# Patient Record
Sex: Female | Born: 1980 | Race: White | Hispanic: No | Marital: Single | Smoking: Current every day smoker
Health system: Southern US, Community
[De-identification: ages and names within clinical notes are randomized; demographics above are authoritative.]

## PROBLEM LIST (undated history)

## (undated) ENCOUNTER — Inpatient Hospital Stay (HOSPITAL_COMMUNITY): Payer: Self-pay

## (undated) DIAGNOSIS — K760 Fatty (change of) liver, not elsewhere classified: Secondary | ICD-10-CM

## (undated) DIAGNOSIS — F410 Panic disorder [episodic paroxysmal anxiety] without agoraphobia: Secondary | ICD-10-CM

## (undated) DIAGNOSIS — G8929 Other chronic pain: Secondary | ICD-10-CM

## (undated) DIAGNOSIS — D500 Iron deficiency anemia secondary to blood loss (chronic): Secondary | ICD-10-CM

## (undated) DIAGNOSIS — G40909 Epilepsy, unspecified, not intractable, without status epilepticus: Secondary | ICD-10-CM

## (undated) DIAGNOSIS — O009 Unspecified ectopic pregnancy without intrauterine pregnancy: Secondary | ICD-10-CM

## (undated) DIAGNOSIS — F319 Bipolar disorder, unspecified: Secondary | ICD-10-CM

## (undated) DIAGNOSIS — F1020 Alcohol dependence, uncomplicated: Secondary | ICD-10-CM

## (undated) DIAGNOSIS — G2581 Restless legs syndrome: Secondary | ICD-10-CM

## (undated) DIAGNOSIS — F4310 Post-traumatic stress disorder, unspecified: Secondary | ICD-10-CM

## (undated) DIAGNOSIS — R1115 Cyclical vomiting syndrome unrelated to migraine: Secondary | ICD-10-CM

## (undated) DIAGNOSIS — K219 Gastro-esophageal reflux disease without esophagitis: Secondary | ICD-10-CM

## (undated) DIAGNOSIS — Z973 Presence of spectacles and contact lenses: Secondary | ICD-10-CM

## (undated) DIAGNOSIS — F4481 Dissociative identity disorder: Secondary | ICD-10-CM

## (undated) DIAGNOSIS — N83209 Unspecified ovarian cyst, unspecified side: Secondary | ICD-10-CM

## (undated) DIAGNOSIS — M549 Dorsalgia, unspecified: Secondary | ICD-10-CM

## (undated) HISTORY — DX: Epilepsy, unspecified, not intractable, without status epilepticus: G40.909

## (undated) HISTORY — PX: ESOPHAGOGASTRODUODENOSCOPY: SHX1529

## (undated) HISTORY — PX: SALPINGOOPHORECTOMY: SHX82

## (undated) HISTORY — PX: COLONOSCOPY: SHX174

---
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
## 2003-03-19 ENCOUNTER — Encounter: Payer: Self-pay | Admitting: Internal Medicine

## 2003-03-19 ENCOUNTER — Encounter: Admission: RE | Admit: 2003-03-19 | Discharge: 2003-03-19 | Payer: Self-pay | Admitting: Internal Medicine

## 2003-03-27 ENCOUNTER — Encounter: Admission: RE | Admit: 2003-03-27 | Discharge: 2003-03-27 | Payer: Self-pay | Admitting: Internal Medicine

## 2003-03-27 ENCOUNTER — Encounter: Payer: Self-pay | Admitting: Internal Medicine

## 2003-12-29 ENCOUNTER — Emergency Department (HOSPITAL_COMMUNITY): Admission: EM | Admit: 2003-12-29 | Discharge: 2003-12-29 | Payer: Self-pay

## 2007-01-05 ENCOUNTER — Emergency Department (HOSPITAL_COMMUNITY): Admission: EM | Admit: 2007-01-05 | Discharge: 2007-01-06 | Payer: Self-pay | Admitting: Emergency Medicine

## 2010-10-18 ENCOUNTER — Emergency Department (HOSPITAL_BASED_OUTPATIENT_CLINIC_OR_DEPARTMENT_OTHER)
Admission: EM | Admit: 2010-10-18 | Discharge: 2010-10-18 | Disposition: A | Discharge status: Home / Self Care | Payer: Self-pay | Attending: Emergency Medicine | Admitting: Emergency Medicine

## 2010-10-18 ENCOUNTER — Emergency Department (INDEPENDENT_AMBULATORY_CARE_PROVIDER_SITE_OTHER): Payer: Self-pay

## 2010-10-18 DIAGNOSIS — M25579 Pain in unspecified ankle and joints of unspecified foot: Secondary | ICD-10-CM

## 2010-10-18 DIAGNOSIS — S92253A Displaced fracture of navicular [scaphoid] of unspecified foot, initial encounter for closed fracture: Secondary | ICD-10-CM | POA: Insufficient documentation

## 2010-10-18 DIAGNOSIS — M79609 Pain in unspecified limb: Secondary | ICD-10-CM

## 2010-10-18 DIAGNOSIS — W1789XA Other fall from one level to another, initial encounter: Secondary | ICD-10-CM | POA: Insufficient documentation

## 2010-10-18 DIAGNOSIS — M7730 Calcaneal spur, unspecified foot: Secondary | ICD-10-CM | POA: Insufficient documentation

## 2010-10-18 DIAGNOSIS — M7989 Other specified soft tissue disorders: Secondary | ICD-10-CM

## 2010-10-18 DIAGNOSIS — Y92009 Unspecified place in unspecified non-institutional (private) residence as the place of occurrence of the external cause: Secondary | ICD-10-CM | POA: Insufficient documentation

## 2011-03-30 LAB — CBC
HCT: 36.9
Hemoglobin: 12
MCHC: 32.6
MCV: 79
Platelets: 272
RBC: 4.67
RDW: 16 — ABNORMAL HIGH
WBC: 9.1

## 2011-03-30 LAB — COMPREHENSIVE METABOLIC PANEL
ALT: <8
AST: 18
Albumin: 3.7
Alkaline Phosphatase: 74
BUN: 10
CO2: 23
Calcium: 8.6
Chloride: 102
Creatinine, Ser: 0.81
GFR calc Af Amer: >60
GFR calc non Af Amer: >60
Glucose, Bld: 90
Potassium: 3.4 — ABNORMAL LOW
Sodium: 133 — ABNORMAL LOW
Total Bilirubin: 0.6
Total Protein: 6.7

## 2011-03-30 LAB — WET PREP, GENITAL
Trich, Wet Prep: NONE SEEN
Yeast Wet Prep HPF POC: NONE SEEN

## 2011-03-30 LAB — URINALYSIS, ROUTINE W REFLEX MICROSCOPIC
Bilirubin Urine: NEGATIVE
Glucose, UA: NEGATIVE
Hgb urine dipstick: NEGATIVE
Ketones, ur: NEGATIVE
Nitrite: NEGATIVE
Protein, ur: NEGATIVE
Specific Gravity, Urine: 1.017
Urobilinogen, UA: 0.2
pH: 6.5

## 2011-03-30 LAB — DIFFERENTIAL
Basophils Absolute: 0.1
Basophils Relative: 1
Eosinophils Absolute: 0.2
Eosinophils Relative: 2
Lymphocytes Relative: 29
Lymphs Abs: 2.6
Monocytes Absolute: 0.5
Monocytes Relative: 6
Neutro Abs: 5.7
Neutrophils Relative %: 63

## 2011-03-30 LAB — POCT PREGNANCY, URINE
Operator id: 231701
Preg Test, Ur: NEGATIVE

## 2011-03-30 LAB — GC/CHLAMYDIA PROBE AMP, GENITAL
Chlamydia, DNA Probe: NEGATIVE
GC Probe Amp, Genital: NEGATIVE

## 2011-03-30 LAB — POCT URINE HEMOGLOBIN
Hgb urine dipstick: POSITIVE — AB
Operator id: 231701

## 2011-03-30 LAB — URINE CULTURE: Colony Count: 50000

## 2011-03-30 LAB — URINE MICROSCOPIC-ADD ON: Urine-Other: NONE SEEN

## 2011-06-16 HISTORY — PX: SALPINGOOPHORECTOMY: SHX82

## 2011-12-29 ENCOUNTER — Inpatient Hospital Stay: Admit: 2011-12-29 | Payer: Self-pay | Admitting: Obstetrics

## 2011-12-29 ENCOUNTER — Encounter (HOSPITAL_COMMUNITY): Payer: Self-pay | Admitting: *Deleted

## 2011-12-29 ENCOUNTER — Encounter (HOSPITAL_COMMUNITY): Payer: Self-pay | Admitting: Anesthesiology

## 2011-12-29 ENCOUNTER — Encounter (HOSPITAL_COMMUNITY)
Admission: AD | Disposition: A | Discharge status: Home / Self Care | Payer: Self-pay | Source: Ambulatory Visit | Attending: Obstetrics

## 2011-12-29 ENCOUNTER — Inpatient Hospital Stay (HOSPITAL_COMMUNITY): Payer: Self-pay | Admitting: Anesthesiology

## 2011-12-29 ENCOUNTER — Ambulatory Visit (HOSPITAL_COMMUNITY)
Admission: AD | Admit: 2011-12-29 | Discharge: 2011-12-29 | Disposition: A | Discharge status: Home / Self Care | Payer: MEDICAID | Source: Ambulatory Visit | Attending: Obstetrics | Admitting: Obstetrics

## 2011-12-29 DIAGNOSIS — R1031 Right lower quadrant pain: Secondary | ICD-10-CM | POA: Insufficient documentation

## 2011-12-29 DIAGNOSIS — O00109 Unspecified tubal pregnancy without intrauterine pregnancy: Secondary | ICD-10-CM | POA: Insufficient documentation

## 2011-12-29 DIAGNOSIS — N949 Unspecified condition associated with female genital organs and menstrual cycle: Secondary | ICD-10-CM | POA: Insufficient documentation

## 2011-12-29 DIAGNOSIS — K661 Hemoperitoneum: Secondary | ICD-10-CM | POA: Insufficient documentation

## 2011-12-29 HISTORY — PX: LAPAROSCOPY: SHX197

## 2011-12-29 LAB — CBC
HCT: 35.1 % — ABNORMAL LOW (ref 36.0–46.0)
Hemoglobin: 11 g/dL — ABNORMAL LOW (ref 12.0–15.0)
MCH: 25.5 pg — ABNORMAL LOW (ref 26.0–34.0)
MCHC: 31.3 g/dL (ref 30.0–36.0)
MCV: 81.3 fL (ref 78.0–100.0)
Platelets: 258 10*3/uL (ref 150–400)
RBC: 4.32 MIL/uL (ref 3.87–5.11)
RDW: 15.1 % (ref 11.5–15.5)
WBC: 10.7 10*3/uL — ABNORMAL HIGH (ref 4.0–10.5)

## 2011-12-29 LAB — ABO/RH: ABO/RH(D): O POS

## 2011-12-29 LAB — TYPE AND SCREEN
ABO/RH(D): O POS
Antibody Screen: NEGATIVE

## 2011-12-29 SURGERY — LAPAROSCOPY OPERATIVE
Anesthesia: General | Site: Abdomen | Laterality: Bilateral | Wound class: Clean Contaminated

## 2011-12-29 MED ORDER — KETOROLAC TROMETHAMINE 30 MG/ML IJ SOLN: 15.0000 mg | Freq: Once | INTRAMUSCULAR | Status: DC | PRN

## 2011-12-29 MED ORDER — GLYCOPYRROLATE 0.2 MG/ML IJ SOLN
INTRAMUSCULAR | Status: DC | PRN
  Administered 2011-12-29: 0.4 mg via INTRAVENOUS

## 2011-12-29 MED ORDER — OXYCODONE-ACETAMINOPHEN 5-325 MG PO TABS: 2.0000 | ORAL_TABLET | Freq: Four times a day (QID) | ORAL | Status: AC | PRN

## 2011-12-29 MED ORDER — KETOROLAC TROMETHAMINE 30 MG/ML IJ SOLN
INTRAMUSCULAR | Status: DC | PRN
  Administered 2011-12-29: 30 mg via INTRAVENOUS

## 2011-12-29 MED ORDER — FENTANYL CITRATE 0.05 MG/ML IJ SOLN
INTRAMUSCULAR | Status: DC | PRN
  Administered 2011-12-29 (×2): 50 ug via INTRAVENOUS
  Administered 2011-12-29: 100 ug via INTRAVENOUS
  Administered 2011-12-29: 50 ug via INTRAVENOUS

## 2011-12-29 MED ORDER — FENTANYL CITRATE 0.05 MG/ML IJ SOLN
25.0000 ug | INTRAMUSCULAR | Status: DC | PRN
  Administered 2011-12-29: 50 ug via INTRAVENOUS

## 2011-12-29 MED ORDER — BUPIVACAINE HCL (PF) 0.25 % IJ SOLN
INTRAMUSCULAR | Status: DC | PRN
  Administered 2011-12-29: 7 mL
  Administered 2011-12-29: 8 mL
  Administered 2011-12-29: 5 mL

## 2011-12-29 MED ORDER — PROPOFOL 10 MG/ML IV EMUL
INTRAVENOUS | Status: DC | PRN
  Administered 2011-12-29: 200 mg via INTRAVENOUS
  Administered 2011-12-29: 100 mg via INTRAVENOUS

## 2011-12-29 MED ORDER — LACTATED RINGERS IR SOLN
Status: DC | PRN
  Administered 2011-12-29: 3000 mL

## 2011-12-29 MED ORDER — NEOSTIGMINE METHYLSULFATE 1 MG/ML IJ SOLN
INTRAMUSCULAR | Status: DC | PRN
  Administered 2011-12-29: 3 mg via INTRAVENOUS

## 2011-12-29 MED ORDER — LACTATED RINGERS IV SOLN
INTRAVENOUS | Status: DC
  Administered 2011-12-29 (×2): via INTRAVENOUS

## 2011-12-29 MED ORDER — ONDANSETRON HCL 4 MG/2ML IJ SOLN
INTRAMUSCULAR | Status: DC | PRN
  Administered 2011-12-29: 4 mg via INTRAVENOUS

## 2011-12-29 MED ORDER — FENTANYL CITRATE 0.05 MG/ML IJ SOLN
INTRAMUSCULAR | Status: AC
  Administered 2011-12-29: 50 ug via INTRAVENOUS
  Filled 2011-12-29: qty 2

## 2011-12-29 MED ORDER — DEXAMETHASONE SODIUM PHOSPHATE 4 MG/ML IJ SOLN
INTRAMUSCULAR | Status: DC | PRN
  Administered 2011-12-29: 8 mg via INTRAVENOUS

## 2011-12-29 MED ORDER — ROCURONIUM BROMIDE 100 MG/10ML IV SOLN
INTRAVENOUS | Status: DC | PRN
  Administered 2011-12-29: 10 mg via INTRAVENOUS
  Administered 2011-12-29: 40 mg via INTRAVENOUS
  Administered 2011-12-29: 10 mg via INTRAVENOUS

## 2011-12-29 MED ORDER — PHENYLEPHRINE HCL 10 MG/ML IJ SOLN
INTRAMUSCULAR | Status: DC | PRN
  Administered 2011-12-29 (×2): 80 ug via INTRAVENOUS

## 2011-12-29 MED ORDER — FAMOTIDINE IN NACL 20-0.9 MG/50ML-% IV SOLN
20.0000 mg | Freq: Once | INTRAVENOUS | Status: AC
  Administered 2011-12-29: 20 mg via INTRAVENOUS
  Filled 2011-12-29: qty 50

## 2011-12-29 MED ORDER — MIDAZOLAM HCL 5 MG/5ML IJ SOLN
INTRAMUSCULAR | Status: DC | PRN
  Administered 2011-12-29: 2 mg via INTRAVENOUS

## 2011-12-29 MED ORDER — DOXYCYCLINE HYCLATE 100 MG IV SOLR
100.0000 mg | Freq: Once | INTRAVENOUS | Status: AC
  Administered 2011-12-29 (×2): 100 mg via INTRAVENOUS
  Filled 2011-12-29: qty 100

## 2011-12-29 MED ORDER — FENTANYL CITRATE 0.05 MG/ML IJ SOLN
INTRAMUSCULAR | Status: AC
  Filled 2011-12-29: qty 2

## 2011-12-29 MED ORDER — CITRIC ACID-SODIUM CITRATE 334-500 MG/5ML PO SOLN
30.0000 mL | Freq: Once | ORAL | Status: AC
  Administered 2011-12-29: 30 mL via ORAL
  Filled 2011-12-29: qty 15

## 2011-12-29 MED ORDER — LIDOCAINE HCL (CARDIAC) 20 MG/ML IV SOLN
INTRAVENOUS | Status: DC | PRN
  Administered 2011-12-29: 100 mg via INTRAVENOUS

## 2011-12-29 MED ORDER — HYDROMORPHONE HCL PF 1 MG/ML IJ SOLN
INTRAMUSCULAR | Status: DC | PRN
  Administered 2011-12-29: 1 mg via INTRAVENOUS

## 2011-12-29 SURGICAL SUPPLY — 41 items
BARRIER ADHS 3X4 INTERCEED (GAUZE/BANDAGES/DRESSINGS) IMPLANT
BLADE SURG 15 STRL LF C SS BP (BLADE) ×1 IMPLANT
BLADE SURG 15 STRL SS (BLADE) ×1
CABLE HIGH FREQUENCY MONO STRZ (ELECTRODE) IMPLANT
CHLORAPREP W/TINT 26ML (MISCELLANEOUS) ×2 IMPLANT
CLOTH BEACON ORANGE TIMEOUT ST (SAFETY) ×2 IMPLANT
COVER MAYO STAND STRL (DRAPES) IMPLANT
DERMABOND ADVANCED (GAUZE/BANDAGES/DRESSINGS)
DERMABOND ADVANCED .7 DNX12 (GAUZE/BANDAGES/DRESSINGS) IMPLANT
ELECT NEEDLE TIP 2.8 STRL (NEEDLE) IMPLANT
FORCEPS CUTTING 33CM 5MM (CUTTING FORCEPS) ×2 IMPLANT
GLOVE BIO SURGEON STRL SZ 6.5 (GLOVE) ×2 IMPLANT
GLOVE BIOGEL PI IND STRL 7.0 (GLOVE) ×2 IMPLANT
GLOVE BIOGEL PI INDICATOR 7.0 (GLOVE) ×2
GOWN PREVENTION PLUS LG XLONG (DISPOSABLE) ×6 IMPLANT
IV STOPCOCK 4 WAY 40  W/Y SET (IV SOLUTION)
IV STOPCOCK 4 WAY 40 W/Y SET (IV SOLUTION) IMPLANT
MANIPULATOR UTERINE 4.5 ZUMI (MISCELLANEOUS) ×2 IMPLANT
NEEDLE INSUFFLATION 14GA 120MM (NEEDLE) IMPLANT
PACK LAPAROSCOPY BASIN (CUSTOM PROCEDURE TRAY) ×2 IMPLANT
PENCIL BUTTON HOLSTER BLD 10FT (ELECTRODE) ×2 IMPLANT
POUCH SPECIMEN RETRIEVAL 10MM (ENDOMECHANICALS) ×2 IMPLANT
PROTECTOR NERVE ULNAR (MISCELLANEOUS) ×2 IMPLANT
SCISSORS LAP 5X35 DISP (ENDOMECHANICALS) IMPLANT
SEALER TISSUE G2 CVD JAW 35 (ENDOMECHANICALS) IMPLANT
SEALER TISSUE G2 CVD JAW 45CM (ENDOMECHANICALS)
SET IRRIG TUBING LAPAROSCOPIC (IRRIGATION / IRRIGATOR) ×2 IMPLANT
SOLUTION ELECTROLUBE (MISCELLANEOUS) IMPLANT
STENT BALLN UTERINE 3CM 6FR (Stent) IMPLANT
STENT BALLN UTERINE 4CM 6FR (STENTS) IMPLANT
SUT VICRYL 0 UR6 27IN ABS (SUTURE) IMPLANT
SUT VICRYL 4-0 PS2 18IN ABS (SUTURE) IMPLANT
SYR 50ML LL SCALE MARK (SYRINGE) IMPLANT
SYR 5ML LL (SYRINGE) ×2 IMPLANT
TOWEL OR 17X24 6PK STRL BLUE (TOWEL DISPOSABLE) ×4 IMPLANT
TRAY FOLEY CATH 14FR (SET/KITS/TRAYS/PACK) ×2 IMPLANT
TROCAR BALLN 12MMX100 BLUNT (TROCAR) ×2 IMPLANT
TROCAR XCEL NON-BLD 11X100MML (ENDOMECHANICALS) IMPLANT
TROCAR XCEL NON-BLD 5MMX100MML (ENDOMECHANICALS) ×4 IMPLANT
WARMER LAPAROSCOPE (MISCELLANEOUS) ×2 IMPLANT
WATER STERILE IRR 1000ML POUR (IV SOLUTION) IMPLANT

## 2011-12-29 NOTE — Transfer of Care (Signed)
Immediate Anesthesia Transfer of Care Note  Patient: Evelyn Lopez  Procedure(s) Performed: Procedure(s) (LRB): LAPAROSCOPY OPERATIVE (Bilateral)  Patient Location: PACU  Anesthesia Type: General  Level of Consciousness: awake, alert  and oriented  Airway & Oxygen Therapy: Patient Spontanous Breathing and Patient connected to nasal cannula oxygen  Post-op Assessment: Report given to PACU RN and Post -op Vital signs reviewed and stable  Post vital signs: stable  Complications: No apparent anesthesia complications

## 2011-12-29 NOTE — Anesthesia Procedure Notes (Signed)
Procedure Name: Intubation Date/Time: 12/29/2011 7:23 PM Performed by: [employer], Jannet Askew Pre-anesthesia Checklist: Patient identified, Patient being monitored, Emergency Drugs available, Timeout performed and Suction available Patient Re-evaluated:Patient Re-evaluated prior to inductionOxygen Delivery Method: Circle system utilized Preoxygenation: Pre-oxygenation with 100% oxygen Intubation Type: IV induction Ventilation: Mask ventilation without difficulty Laryngoscope Size: Mac and 3 Grade View: Grade I Tube type: Oral Tube size: 7.0 mm Number of attempts: 1 Secured at: 22 cm Dental Injury: Teeth and Oropharynx as per pre-operative assessment

## 2011-12-29 NOTE — Brief Op Note (Signed)
12/29/2011  8:57 PM  PATIENT:  Evelyn Lopez  31 y.o. female  PRE-OPERATIVE DIAGNOSIS:  Hemoperitoneum; Pelvic Pain; bilateral lower quadrant pain; probable right Ectopic  POST-OPERATIVE DIAGNOSIS: Hemoperitoneum; Pelvic Pain; bilateral lower quadrant pain;  right Ectopic  PROCEDURE:  Procedure(s) (LRB): LAPAROSCOPY OPERATIVE (Bilateral), right salpingectomy, evacuation of hemoperitoneum  SURGEON:  Surgeon(s) and Role:    * Mali Eppard A. Ernestina Penna, MD - Primary    * Robley Fries, MD - Assisting  PHYSICIAN ASSISTANT:   ANESTHESIA:   local and general  EBL:  Total I/O In: 1700 [I.V.:1700] Out: 400 [Urine:350; Blood:50]  BLOOD ADMINISTERED:none  DRAINS: none   LOCAL MEDICATIONS USED:  MARCAINE   1/4% about 10 cc  SPECIMEN:  Source of Specimen:  Right tube with ectopic pregnancy  DISPOSITION OF SPECIMEN:  PATHOLOGY  COUNTS:  YES  TOURNIQUET:  * No tourniquets in log *  DICTATION: .Note written in EPIC  PLAN OF CARE: Discharge to home after PACU  PATIENT DISPOSITION:  PACU - hemodynamically stable.   Delay start of Pharmacological VTE agent (>24hrs) due to surgical blood loss or risk of bleeding: yes

## 2011-12-29 NOTE — Anesthesia Postprocedure Evaluation (Signed)
Anesthesia Post Note  Patient: Evelyn Lopez  Procedure(s) Performed: Procedure(s) (LRB): LAPAROSCOPY OPERATIVE (Bilateral)  Anesthesia type: General  Patient location: PACU  Post pain: Pain level controlled  Post assessment: Post-op Vital signs reviewed  Last Vitals:  Filed Vitals:   12/29/11 2115  BP: 128/110  Pulse: 93  Temp:   Resp: 16    Post vital signs: Reviewed  Level of consciousness: sedated  Complications: No apparent anesthesia complicationsfj

## 2011-12-29 NOTE — H&P (Signed)
See paper document/ scanned H&P for full details.  In short, pt w/ long h/o infertility, not actively contracepting presented to the office with positive pregnancy test and mild cramping last week. Patient was assessed and found to be stable at that time. A beta hCG at that time was 267. Blood type was Rh+. Patient represented today with acute onset of severe abdominal pain in the bilateral lower quadrant. Patient notes emesis last night, nausea today and has not eaten since 9 AM. Patient states pain is constant and severe and she is unable to get into a comfortable position, cannot sit, cannot walk. Patient has not taken any medicines for her pain.  Filed Vitals:   12/29/11 1833 12/29/11 1852  BP:  135/86  Pulse:  85  Height: 5' 8.5" (1.74 m)   Weight: 104.01 kg (229 lb 4.8 oz)    CT scan H&P for full exam Most notable patient with no rebound, voluntary guarding, moderate tenderness in bilateral lower quadrants and moderate tenderness on pelvic exam in both adnexa   Ultrasounds: Large amount of complex free fluid, possible right-sided ectopic, 2 by 2 x 2 right sided mass with peripheral flow  Beta hCG: 201  Assessment and plan: Hemoperitoneum with acute onset lower abdominal pain, concern for right-sided ectopic versus right-sided tubal abortion. The patient has consented to diagnostic laparoscopy and D&C if needed, evacuation of hemoperitoneum, and possible right salpingectomy.  Jeancarlos Marchena A. 12/29/2011 7:02 PM

## 2011-12-29 NOTE — Anesthesia Preprocedure Evaluation (Signed)
Anesthesia Evaluation  Patient identified by MRN, date of birth, ID band Patient awake    Reviewed: Allergy & Precautions, H&P , NPO status , Patient's Chart, lab work & pertinent test results, reviewed documented beta blocker date and time   History of Anesthesia Complications Negative for: history of anesthetic complications  Airway Mallampati: I TM Distance: >3 FB Neck ROM: full    Dental  (+) Teeth Intact   Pulmonary Current Smoker (1 ppd),  breath sounds clear to auscultation        Cardiovascular negative cardio ROS  Rhythm:regular Rate:Normal     Neuro/Psych negative neurological ROS  negative psych ROS   GI/Hepatic negative GI ROS, Neg liver ROS,   Endo/Other  negative endocrine ROS  Renal/GU negative Renal ROS  negative genitourinary   Musculoskeletal   Abdominal   Peds  Hematology negative hematology ROS (+)   Anesthesia Other Findings Last ate at 9 am, sips of liquids at 4 pm.  Reproductive/Obstetrics (+) Pregnancy (ectopic 4 weeks)                           Anesthesia Physical Anesthesia Plan  ASA: II  Anesthesia Plan: General ETT   Post-op Pain Management:    Induction:   Airway Management Planned:   Additional Equipment:   Intra-op Plan:   Post-operative Plan:   Informed Consent: I have reviewed the patients History and Physical, chart, labs and discussed the procedure including the risks, benefits and alternatives for the proposed anesthesia with the patient or authorized representative who has indicated his/her understanding and acceptance.   Dental Advisory Given  Plan Discussed with: CRNA and Surgeon  Anesthesia Plan Comments:         Anesthesia Quick Evaluation

## 2011-12-29 NOTE — Op Note (Signed)
12/29/2011  8:57 PM  PATIENT:  Evelyn Lopez  31 y.o. female  PRE-OPERATIVE DIAGNOSIS:  Hemoperitoneum; Pelvic Pain; bilateral lower quadrant pain; probable right Ectopic  POST-OPERATIVE DIAGNOSIS: Hemoperitoneum; Pelvic Pain; bilateral lower quadrant pain;  right Ectopic  PROCEDURE:  Procedure(s) (LRB): LAPAROSCOPY OPERATIVE (Bilateral), right salpingectomy, evacuation of hemoperitoneum  SURGEON:  Surgeon(s) and Role:    * Airyanna Dipalma A. Ernestina Penna, MD - Primary    * Robley Fries, MD - Assisting  PHYSICIAN ASSISTANT:   ANESTHESIA:   local and general  EBL:  Total I/O In: 1700 [I.V.:1700] Out: 400 [Urine:350; Blood:50]  BLOOD ADMINISTERED:none  DRAINS: none   LOCAL MEDICATIONS USED:  MARCAINE   1/4% about 10 cc  SPECIMEN:  Source of Specimen:  Right tube with ectopic pregnancy  DISPOSITION OF SPECIMEN:  PATHOLOGY  COUNTS:  YES  TOURNIQUET:  * No tourniquets in log *  DICTATION: .Note written in EPIC  PLAN OF CARE: Discharge to home after PACU  PATIENT DISPOSITION:  PACU - hemodynamically stable.   Delay start of Pharmacological VTE agent (>24hrs) due to surgical blood loss or risk of bleeding: yes Antibiotics: 100 mg of doxycycline Complications none Findings: Moderate amount of hemoperitoneum, approximately 150 cc of blood, normal-appearing uterus, normal bilateral ovaries, right fallopian tube with mid isthmic distention consistent with ectopic pregnancy, slow bleeding from the fimbriated end, no tubal rupture, peritubal and periovarian adhesions in the right adnexa, ovary with filmy adhesions to the right ovarian fossa, more dense adhesions between the right tube and the right ovary, left tube with distal endosalpinges, small amount of normal appearing left fimbria, left fimbria in good approximation to the left ovary, left ovary with filmy adhesions to the left ovarian fossa, filmy adhesions in the posterior cul-de-sac between the uterosacral ligaments and the pelvic  sidewall, Lynnae January adhesions around the liver, no evidence for endometriosis, no endometrial implants, findings most consistent with prior PID  Indications: This is a 31 year old G0 with long history of infertility who was found to have an unplanned but desired pregnancy late last week. Patient presented last week with abdominal cramping. Beta hCG was found to be low, blood type was Rh+. Patient was allowed expectant management but returned today with increasing pain which had become severe earlier this afternoon. Evaluation was concerning for hemoperitoneum and severe abdominal pain. Decision was made to proceed to the OR for evacuation of hemoperitoneum and removal of probable right ectopic pregnancy.   Procedure: After informed consent including discussion of risks of bleeding, infection, failure to achieve desired outcome and discussion of alternatives including expectant management with serial exams and CBC checks, the patient was taken to the operating room where general anesthesia was initiated without difficulty. She was prepped and draped in normal sterile fashion in the dorsal supine lithotomy position. A Foley catheter was inserted sterilely into the bladder. A bimanual examination was done to assess the size and position of the uterus. The speculum was inserted into the vagina. A single-tooth tenaculum was used to grasp the anterior lip of the cervix. The cervix was dilated to a #21 Pratt  dilator. A ZUMI uterine manipulator was easily inserted into the uterus and the intrauterine balloon was inflated.  Attention was then turned to the patient's abdomen. 0.5 % marcaine was used prior to all incision. About 10 cc of marcaine was used.  A 10 mm incision was made in the umbilicus and blunt and sharp dissection was done until the fascia was identified. This was then  grasped with Kocher clamps x2 and entered sharply. A pursestring suture of 0 Vicryl was then placed along the incision and a  non-bladed Roseanne Reno was inserted into the peritoneal cavity. Intraperitoneal placement was confirmed with the use of the camera and pneumoperitoneum was created to 15 mm of mercury. The pursestring suture was secured around the port and pneumoperitoneum was maintained. Brief survey of the abdomen and pelvis was done with findings as above. The abdominal wall was assessed and additional port sites were marked. 5 mm incisions were placed in the right and left lower quadrants and Ports were placed under direct visualization.   Using the ZUMI to position the uterus, suction irrigation was done to evacuate the hemoperitoneum. The blunt and grasping instruments were placed through the ports to allow inspection of the pelvis with findings as above. The right side ectopic was seen and the decision was made to proceed with right salpingectomy. Using a combination of blunt dissection and sharp dissection with the tripolar device the the ovarian and peritubal adhesions on the right were taken down. The mesial salpinx between the right tube and the right ovary was serially clamped cauterized and transected. The proximal and of the right fallopian tube at the cornual region was serially grasped cauterized and transected. This process was carried out until the entire right tube containing the ectopic pregnancy was free. Suction irrigation was used to confirm hemostasis of the cauterized the bed of remaining tissue.  The instruments were changed so that a 5 mm camera was placed through the left lower quadrant port and the Endo Catch bag was placed through the umbilicus incision. The right tube and ectopic pregnancy was placed in the Endo Catch bag and removed through the umbilicus.  Reinspection of the cauterized bed revealed hemostasis on the right adnexa. Suction irrigation was carried out and the patient was placed in and out of Trendelenburg position several times in order to remove the majority of the irrigation fluid and  hemoperitoneum. The procedure was then terminated pneumoperitoneum was released ports were removed instruments were removed. The pursestring suture at the fascial incision was then tied and the umbilical incision was closed with 0 Vicryl in a subcuticular fashion the right and left lower quadrant port sites were closed with Dermabond.  The uterine manipulator was removed. The vagina was hemostatic. A Foley catheter was removed.  The patient was awoken from general anesthesia having tolerated the procedure well sponge lap and needle counts were correct x3 and patient was taken to the recovery room in stable condition.  Tavi Hoogendoorn A. 12/29/2011 9:13 PM

## 2011-12-30 ENCOUNTER — Encounter (HOSPITAL_COMMUNITY): Payer: Self-pay | Admitting: Obstetrics

## 2012-09-03 ENCOUNTER — Emergency Department (HOSPITAL_BASED_OUTPATIENT_CLINIC_OR_DEPARTMENT_OTHER)
Admission: EM | Admit: 2012-09-03 | Discharge: 2012-09-04 | Disposition: A | Discharge status: Home / Self Care | Payer: BC Managed Care – PPO | Attending: Emergency Medicine | Admitting: Emergency Medicine

## 2012-09-03 ENCOUNTER — Emergency Department (HOSPITAL_BASED_OUTPATIENT_CLINIC_OR_DEPARTMENT_OTHER): Payer: BC Managed Care – PPO

## 2012-09-03 ENCOUNTER — Encounter (HOSPITAL_BASED_OUTPATIENT_CLINIC_OR_DEPARTMENT_OTHER): Payer: Self-pay | Admitting: *Deleted

## 2012-09-03 DIAGNOSIS — Y9389 Activity, other specified: Secondary | ICD-10-CM | POA: Insufficient documentation

## 2012-09-03 DIAGNOSIS — F319 Bipolar disorder, unspecified: Secondary | ICD-10-CM | POA: Insufficient documentation

## 2012-09-03 DIAGNOSIS — Y9289 Other specified places as the place of occurrence of the external cause: Secondary | ICD-10-CM | POA: Insufficient documentation

## 2012-09-03 DIAGNOSIS — S6990XA Unspecified injury of unspecified wrist, hand and finger(s), initial encounter: Secondary | ICD-10-CM | POA: Insufficient documentation

## 2012-09-03 DIAGNOSIS — Z79899 Other long term (current) drug therapy: Secondary | ICD-10-CM | POA: Insufficient documentation

## 2012-09-03 DIAGNOSIS — S59909A Unspecified injury of unspecified elbow, initial encounter: Secondary | ICD-10-CM | POA: Insufficient documentation

## 2012-09-03 DIAGNOSIS — X503XXA Overexertion from repetitive movements, initial encounter: Secondary | ICD-10-CM | POA: Insufficient documentation

## 2012-09-03 DIAGNOSIS — Y990 Civilian activity done for income or pay: Secondary | ICD-10-CM | POA: Insufficient documentation

## 2012-09-03 DIAGNOSIS — S6992XA Unspecified injury of left wrist, hand and finger(s), initial encounter: Secondary | ICD-10-CM

## 2012-09-03 DIAGNOSIS — Z87891 Personal history of nicotine dependence: Secondary | ICD-10-CM | POA: Insufficient documentation

## 2012-09-03 HISTORY — DX: Bipolar disorder, unspecified: F31.9

## 2012-09-03 MED ORDER — HYDROCODONE-ACETAMINOPHEN 5-325 MG PO TABS
ORAL_TABLET | ORAL | Status: AC
  Administered 2012-09-03: 1
  Filled 2012-09-03: qty 1

## 2012-09-03 MED ORDER — HYDROCODONE-ACETAMINOPHEN 5-500 MG PO TABS: 1.0000 | ORAL_TABLET | Freq: Four times a day (QID) | ORAL | Status: DC | PRN

## 2012-09-03 MED ORDER — ZIPRASIDONE MESYLATE 20 MG IM SOLR: 20.0000 mg | Freq: Once | INTRAMUSCULAR | Status: DC

## 2012-09-03 NOTE — ED Notes (Signed)
Pt states she injured her left wrist while lifting a heavy tray earlier today. PMS intact.

## 2012-09-03 NOTE — ED Notes (Signed)
Velcro splint applied with ice bag.

## 2012-09-04 NOTE — ED Provider Notes (Signed)
History     CSN: [id number]  Arrival date & time 09/03/12  1754   First MD Initiated Contact with Patient 09/03/12 1950      Chief Complaint  Patient presents with  . Wrist Injury    (Consider location/radiation/quality/duration/timing/severity/associated sxs/prior treatment) HPI Comments: Pt presenting to the ED for L wrist injury while working today.  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  Area began to swell and has become increasingly painful. Admits to intermittent paresthesias in an ulnar distribution.  No prior L wrist injury.  No loss of sensation to L hand or fingers.  The history is provided by the patient.    Past Medical History  Diagnosis Date  . Bipolar disorder     Past Surgical History  Procedure Laterality Date  . Laparoscopy  12/29/2011    Procedure: LAPAROSCOPY OPERATIVE;  Surgeon: Tresa Endo A. Ernestina Penna, MD;  Location: [facility];  Service: Gynecology;  Laterality: Bilateral;  Right Salpingectomy    History reviewed. No pertinent family history.  History  Substance Use Topics  . Smoking status: Former Games developer  . Smokeless tobacco: Not on file  . Alcohol Use: No    OB History   Grav Para Term Preterm Abortions TAB SAB Ect Mult Living   2 0 0 0 0 0 0 0 0 0       Review of Systems  Musculoskeletal: Positive for arthralgias.  All other systems reviewed and are negative.    Allergies  Review of patient's allergies indicates no known allergies.  Home Medications   Current Outpatient Rx  Name  Route  Sig  Dispense  Refill  . clonazePAM (KLONOPIN) 0.5 MG tablet   Oral   Take 0.5 mg by mouth 2 (two) times daily as needed for anxiety.         . Oxcarbazepine (TRILEPTAL) 300 MG tablet   Oral   Take 600 mg by mouth daily.         Marland Kitchen HYDROcodone-acetaminophen (VICODIN) 5-500 MG per tablet   Oral   Take 1-2 tablets by mouth every 6 (six) hours as needed for pain.   15 tablet    0   . ibuprofen (ADVIL,MOTRIN) 400 MG tablet   Oral   Take 400 mg by mouth every 6 (six) hours as needed. For pain.         . metroNIDAZOLE (FLAGYL) 500 MG tablet   Oral   Take 500 mg by mouth 3 (three) times daily.           BP 130/96  Pulse 99  Temp(Src) 98.2 F (36.8 C) (Oral)  Resp 20  Ht 5\' 10"  (1.778 m)  Wt 200 lb (90.719 kg)  BMI 28.7 kg/m2  SpO2 100%  LMP 09/01/2012  Breastfeeding? Unknown  Physical Exam  Nursing note and vitals reviewed. Constitutional: She is oriented to person, place, and time. She appears well-developed and well-nourished.  HENT:  Head: Normocephalic and atraumatic.  Eyes: Conjunctivae and EOM are normal.  Neck: Normal range of motion.  Cardiovascular: Normal rate, regular rhythm and normal heart sounds.   Pulmonary/Chest: Effort normal and breath sounds normal.  Musculoskeletal:       Left wrist: She exhibits decreased range of motion (due to pain), tenderness and swelling. She exhibits no bony tenderness, no effusion, no deformity and no laceration.  Normal sensation in L fingers, no snuffbox tenderness, strong radial pulse  Neurological: She  is alert and oriented to person, place, and time.  Skin: Skin is warm and dry.  Psychiatric: She has a normal mood and affect.    ED Course  Procedures (including critical care time)  Labs Reviewed - No data to display Dg Wrist Complete Left  09/03/2012  *RADIOLOGY REPORT*  Clinical Data: Wrist injury.  LEFT WRIST - COMPLETE 3+ VIEW  Comparison: None  Findings: Negative for fracture.  Normal alignment and no significant degenerative change.  IMPRESSION: Negative   Original Report Authenticated By: Janeece Riggers, M.D.      1. Wrist injury, left, initial encounter       MDM   X-ray negative for acute fx.  Removable thumb spica splint placed in ED.  Mother states she has an orthopedic that she would like her to follow up with.  Rx given vicodin.  Restricted duty at work to lifting <25 pounds  x 1 week.  Return precautions advised.       Garlon Hatchet, PA-C 09/04/12 [phone]

## 2012-09-05 NOTE — ED Provider Notes (Signed)
Medical screening examination/treatment/procedure(s) were performed by non-physician practitioner and as supervising physician I was immediately available for consultation/collaboration.  Nancylee Gaines, MD 09/05/12 0831 

## 2012-10-18 ENCOUNTER — Encounter (HOSPITAL_COMMUNITY): Payer: Self-pay | Admitting: *Deleted

## 2012-10-18 ENCOUNTER — Emergency Department (HOSPITAL_COMMUNITY): Payer: BC Managed Care – PPO

## 2012-10-18 ENCOUNTER — Emergency Department (HOSPITAL_COMMUNITY)
Admission: EM | Admit: 2012-10-18 | Discharge: 2012-10-18 | Disposition: A | Discharge status: Home / Self Care | Payer: BC Managed Care – PPO | Attending: Emergency Medicine | Admitting: Emergency Medicine

## 2012-10-18 DIAGNOSIS — Z79899 Other long term (current) drug therapy: Secondary | ICD-10-CM | POA: Insufficient documentation

## 2012-10-18 DIAGNOSIS — Z3202 Encounter for pregnancy test, result negative: Secondary | ICD-10-CM | POA: Insufficient documentation

## 2012-10-18 DIAGNOSIS — F319 Bipolar disorder, unspecified: Secondary | ICD-10-CM | POA: Insufficient documentation

## 2012-10-18 DIAGNOSIS — Z87891 Personal history of nicotine dependence: Secondary | ICD-10-CM | POA: Insufficient documentation

## 2012-10-18 DIAGNOSIS — F410 Panic disorder [episodic paroxysmal anxiety] without agoraphobia: Secondary | ICD-10-CM | POA: Insufficient documentation

## 2012-10-18 DIAGNOSIS — Z9079 Acquired absence of other genital organ(s): Secondary | ICD-10-CM | POA: Insufficient documentation

## 2012-10-18 DIAGNOSIS — N739 Female pelvic inflammatory disease, unspecified: Secondary | ICD-10-CM | POA: Insufficient documentation

## 2012-10-18 DIAGNOSIS — Z8742 Personal history of other diseases of the female genital tract: Secondary | ICD-10-CM | POA: Insufficient documentation

## 2012-10-18 HISTORY — DX: Unspecified ovarian cyst, unspecified side: N83.209

## 2012-10-18 HISTORY — DX: Panic disorder (episodic paroxysmal anxiety): F41.0

## 2012-10-18 HISTORY — DX: Unspecified ectopic pregnancy without intrauterine pregnancy: O00.90

## 2012-10-18 LAB — URINALYSIS, ROUTINE W REFLEX MICROSCOPIC
Bilirubin Urine: NEGATIVE
Glucose, UA: NEGATIVE mg/dL
Hgb urine dipstick: NEGATIVE
Ketones, ur: NEGATIVE mg/dL
Leukocytes, UA: NEGATIVE
Nitrite: NEGATIVE
Protein, ur: NEGATIVE mg/dL
Specific Gravity, Urine: 1.014 (ref 1.005–1.030)
Urobilinogen, UA: 0.2 mg/dL (ref 0.0–1.0)
pH: 6.5 (ref 5.0–8.0)

## 2012-10-18 LAB — WET PREP, GENITAL
Trich, Wet Prep: NONE SEEN
Yeast Wet Prep HPF POC: NONE SEEN

## 2012-10-18 LAB — COMPREHENSIVE METABOLIC PANEL
ALT: 8 U/L (ref 0–35)
AST: 20 U/L (ref 0–37)
Albumin: 3.7 g/dL (ref 3.5–5.2)
Alkaline Phosphatase: 67 U/L (ref 39–117)
BUN: 10 mg/dL (ref 6–23)
CO2: 25 mEq/L (ref 19–32)
Calcium: 9.3 mg/dL (ref 8.4–10.5)
Chloride: 108 mEq/L (ref 96–112)
Creatinine, Ser: 0.77 mg/dL (ref 0.50–1.10)
GFR calc Af Amer: >90 mL/min (ref >90)
GFR calc non Af Amer: >90 mL/min (ref >90)
Glucose, Bld: 88 mg/dL (ref 70–99)
Potassium: 4 mEq/L (ref 3.5–5.1)
Sodium: 140 mEq/L (ref 135–145)
Total Bilirubin: 0.3 mg/dL (ref 0.3–1.2)
Total Protein: 7.1 g/dL (ref 6.0–8.3)

## 2012-10-18 LAB — CBC WITH DIFFERENTIAL/PLATELET
Basophils Absolute: 0 10*3/uL (ref 0.0–0.1)
Basophils Relative: 1 % (ref 0–1)
Eosinophils Absolute: 0.1 10*3/uL (ref 0.0–0.7)
Eosinophils Relative: 2 % (ref 0–5)
HCT: 38.4 % (ref 36.0–46.0)
Hemoglobin: 12.6 g/dL (ref 12.0–15.0)
Lymphocytes Relative: 33 % (ref 12–46)
Lymphs Abs: 1.7 10*3/uL (ref 0.7–4.0)
MCH: 26.6 pg (ref 26.0–34.0)
MCHC: 32.8 g/dL (ref 30.0–36.0)
MCV: 81 fL (ref 78.0–100.0)
Monocytes Absolute: 0.3 10*3/uL (ref 0.1–1.0)
Monocytes Relative: 5 % (ref 3–12)
Neutro Abs: 3.2 10*3/uL (ref 1.7–7.7)
Neutrophils Relative %: 59 % (ref 43–77)
Platelets: 276 10*3/uL (ref 150–400)
RBC: 4.74 MIL/uL (ref 3.87–5.11)
RDW: 14.5 % (ref 11.5–15.5)
WBC: 5.4 10*3/uL (ref 4.0–10.5)

## 2012-10-18 LAB — POCT PREGNANCY, URINE: Preg Test, Ur: NEGATIVE

## 2012-10-18 LAB — LIPASE, BLOOD: Lipase: 36 U/L (ref 11–59)

## 2012-10-18 MED ORDER — HYDROMORPHONE HCL PF 1 MG/ML IJ SOLN
1.0000 mg | Freq: Once | INTRAMUSCULAR | Status: AC
  Administered 2012-10-18: 1 mg via INTRAVENOUS
  Filled 2012-10-18: qty 1

## 2012-10-18 MED ORDER — DOXYCYCLINE HYCLATE 100 MG PO TABS
100.0000 mg | ORAL_TABLET | Freq: Once | ORAL | Status: AC
  Administered 2012-10-18: 100 mg via ORAL
  Filled 2012-10-18: qty 1

## 2012-10-18 MED ORDER — DOXYCYCLINE HYCLATE 100 MG PO CAPS: 100.0000 mg | ORAL_CAPSULE | Freq: Two times a day (BID) | ORAL | Status: DC

## 2012-10-18 MED ORDER — ONDANSETRON HCL 4 MG/2ML IJ SOLN
4.0000 mg | Freq: Once | INTRAMUSCULAR | Status: AC
  Administered 2012-10-18: 4 mg via INTRAVENOUS
  Filled 2012-10-18: qty 2

## 2012-10-18 MED ORDER — CEFTRIAXONE SODIUM 250 MG IJ SOLR
250.0000 mg | Freq: Once | INTRAMUSCULAR | Status: AC
  Administered 2012-10-18: 250 mg via INTRAMUSCULAR
  Filled 2012-10-18: qty 250

## 2012-10-18 MED ORDER — SODIUM CHLORIDE 0.9 % IV SOLN
Freq: Once | INTRAVENOUS | Status: AC
  Administered 2012-10-18: 12:00:00 via INTRAVENOUS

## 2012-10-18 MED ORDER — HYDROCODONE-ACETAMINOPHEN 5-325 MG PO TABS: ORAL_TABLET | ORAL | Status: DC

## 2012-10-18 MED ORDER — METRONIDAZOLE 500 MG PO TABS: 500.0000 mg | ORAL_TABLET | Freq: Two times a day (BID) | ORAL | Status: DC

## 2012-10-18 NOTE — ED Notes (Signed)
Pt reports she was helping her mom move stuff last night. Sat down and started having lower abdominal pain at present 9/10. Pain radiates around to back like "an inner tube'. Had ectopic pregnancy last June 2013 also hx of ovarian cysts. Pt hx of panic attacks, had one last night due to the pain.

## 2012-10-18 NOTE — ED Notes (Signed)
Pt c/o increasing pain.  Will notify PA.

## 2012-10-18 NOTE — ED Provider Notes (Signed)
History     CSN: [id number]  Arrival date & time 10/18/12  1052   First MD Initiated Contact with Patient 10/18/12 1102      Chief Complaint  Patient presents with  . Abdominal Pain  . hx of ectopic pregnancy     (Consider location/radiation/quality/duration/timing/severity/associated sxs/prior treatment) HPI Comments: Patient with history of right salpingectomy due to ectopic pregnancy, ovarian cysts -- presents with complaint of right lower abdominal pain that began approximately 8 o'clock last night. Pain is described as sharp and stabbing. It radiates to her bilateral lower back. It is not associated with fever, nausea, vomiting, diarrhea, vaginal bleeding or discharge, dysuria or other urinary symptoms. Palpation and movement makes pain worse. Nothing makes it better. Last menstrual period was 3 weeks ago and was normal for the patient. Home meds have not been helping the pain. Onset of symptoms gradual. Course is constant.  Patient is a 32 y.o. female presenting with abdominal pain. The history is provided by the patient.  Abdominal Pain Associated symptoms: no chest pain, no cough, no diarrhea, no dysuria, no fever, no nausea, no sore throat, no vaginal bleeding, no vaginal discharge and no vomiting     Past Medical History  Diagnosis Date  . Bipolar disorder   . Panic attacks   . Ectopic pregnancy     June 2013  . Ovarian cyst     Past Surgical History  Procedure Laterality Date  . Laparoscopy  12/29/2011    Procedure: LAPAROSCOPY OPERATIVE;  Surgeon: Tresa Endo A. Ernestina Penna, MD;  Location: [facility];  Service: Gynecology;  Laterality: Bilateral;  Right Salpingectomy    No family history on file.  History  Substance Use Topics  . Smoking status: Former Games developer  . Smokeless tobacco: Not on file  . Alcohol Use: No    OB History   Grav Para Term Preterm Abortions TAB SAB Ect Mult Living   2 0 0 0 0 0 0 0 0 0       Review of Systems  Constitutional: Negative for fever.    HENT: Negative for sore throat and rhinorrhea.   Eyes: Negative for redness.  Respiratory: Negative for cough.   Cardiovascular: Negative for chest pain.  Gastrointestinal: Positive for abdominal pain. Negative for nausea, vomiting and diarrhea.  Genitourinary: Positive for pelvic pain. Negative for dysuria, vaginal bleeding and vaginal discharge.  Musculoskeletal: Negative for myalgias.  Skin: Negative for rash.  Neurological: Negative for headaches.    Allergies  Review of patient's allergies indicates no known allergies.  Home Medications   Current Outpatient Rx  Name  Route  Sig  Dispense  Refill  . clonazePAM (KLONOPIN) 0.5 MG tablet   Oral   Take 0.5 mg by mouth 2 (two) times daily as needed for anxiety.         Marland Kitchen HYDROcodone-acetaminophen (VICODIN) 5-500 MG per tablet   Oral   Take 1-2 tablets by mouth every 6 (six) hours as needed for pain.   15 tablet   0   . ibuprofen (ADVIL,MOTRIN) 400 MG tablet   Oral   Take 400 mg by mouth every 6 (six) hours as needed. For pain.         . metroNIDAZOLE (FLAGYL) 500 MG tablet   Oral   Take 500 mg by mouth 3 (three) times daily.         . Oxcarbazepine (TRILEPTAL) 300 MG tablet   Oral   Take 600 mg by mouth daily.  BP 121/88  Pulse 92  Temp(Src) 98.6 F (37 C) (Oral)  Resp 20  SpO2 100%  LMP 09/25/2012  Physical Exam  Nursing note and vitals reviewed. Constitutional: She appears well-developed and well-nourished.  HENT:  Head: Normocephalic and atraumatic.  Eyes: Conjunctivae are normal. Right eye exhibits no discharge. Left eye exhibits no discharge.  Neck: Normal range of motion. Neck supple.  Cardiovascular: Normal rate, regular rhythm and normal heart sounds.   Pulmonary/Chest: Effort normal and breath sounds normal.  Abdominal: Soft. She exhibits no distension. Bowel sounds are decreased. There is tenderness (Generalized tenderness worse in area shown.). There is no rebound and no  guarding.    + Rovsing's  Genitourinary: Uterus is tender. Uterus is not enlarged. Cervix exhibits motion tenderness and discharge (white). Cervix exhibits no friability. Right adnexum displays tenderness. Right adnexum displays no mass and no fullness. Left adnexum displays no mass, no tenderness and no fullness. No tenderness or bleeding around the vagina. No signs of injury around the vagina. Vaginal discharge found.  Neurological: She is alert.  Skin: Skin is warm and dry.  Psychiatric: She has a normal mood and affect.    ED Course  Procedures (including critical care time)  Labs Reviewed  WET PREP, GENITAL - Abnormal; Notable for the following:    Clue Cells Wet Prep HPF POC MODERATE (*)    WBC, Wet Prep HPF POC MANY (*)    All other components within normal limits  GC/CHLAMYDIA PROBE AMP  CBC WITH DIFFERENTIAL  COMPREHENSIVE METABOLIC PANEL  LIPASE, BLOOD  URINALYSIS, ROUTINE W REFLEX MICROSCOPIC  URINALYSIS, MICROSCOPIC ONLY  POCT PREGNANCY, URINE   US Transvaginal Non-ob  10/18/2012  *RADIOLOGY REPORT*  Clinical Data:  32 year old female with right pelvic pain.  History of right ectopic pregnancy and right salpingectomy.  TRANSABDOMINAL AND TRANSVAGINAL ULTRASOUND OF PELVIS DOPPLER ULTRASOUND OF OVARIES  Technique:  Both transabdominal and transvaginal ultrasound examinations of the pelvis were performed. Transabdominal technique was performed for global imaging of the pelvis including uterus, ovaries, adnexal regions, and pelvic cul-de-sac.  It was necessary to proceed with endovaginal exam following the transabdominal exam to visualize the ovaries and endometrium.  Color and duplex Doppler ultrasound was utilized to evaluate blood flow to the ovaries.  Comparison:  01/06/2007 pelvic ultrasound  Findings:  Uterus:  The uterus is anteverted measuring 6.9 x 3.8 x 5.1 cm.  No focal uterine masses are identified.  Endometrium:  The endometrial stripe is homogeneous measuring 13 mm in  diameter.  Right ovary: The right ovary is normal in appearance and size measuring 3.4 x 2.9 x 2.8 cm.  A 1.3 x 1.7 cm cyst is identified. Normal color flow and arterial/venous waveforms are noted.  Left ovary:   The left ovary is unremarkable measuring 2.3 x 1.8 x 2.2 cm.  Normal color flow and arterial/venous waveforms are noted.  Pulsed Doppler evaluation demonstrates normal low-resistance arterial and venous waveforms in both ovaries.  IMPRESSION: Normal exam.  No evidence of pelvic mass or other significant abnormality.  No sonographic evidence for ovarian torsion.   Original Report Authenticated By: Harmon Pier, M.D.    US Pelvis Complete  10/18/2012  *RADIOLOGY REPORT*  Clinical Data:  32 year old female with right pelvic pain.  History of right ectopic pregnancy and right salpingectomy.  TRANSABDOMINAL AND TRANSVAGINAL ULTRASOUND OF PELVIS DOPPLER ULTRASOUND OF OVARIES  Technique:  Both transabdominal and transvaginal ultrasound examinations of the pelvis were performed. Transabdominal technique was performed for global imaging of the  pelvis including uterus, ovaries, adnexal regions, and pelvic cul-de-sac.  It was necessary to proceed with endovaginal exam following the transabdominal exam to visualize the ovaries and endometrium.  Color and duplex Doppler ultrasound was utilized to evaluate blood flow to the ovaries.  Comparison:  01/06/2007 pelvic ultrasound  Findings:  Uterus:  The uterus is anteverted measuring 6.9 x 3.8 x 5.1 cm.  No focal uterine masses are identified.  Endometrium:  The endometrial stripe is homogeneous measuring 13 mm in diameter.  Right ovary: The right ovary is normal in appearance and size measuring 3.4 x 2.9 x 2.8 cm.  A 1.3 x 1.7 cm cyst is identified. Normal color flow and arterial/venous waveforms are noted.  Left ovary:   The left ovary is unremarkable measuring 2.3 x 1.8 x 2.2 cm.  Normal color flow and arterial/venous waveforms are noted.  Pulsed Doppler evaluation  demonstrates normal low-resistance arterial and venous waveforms in both ovaries.  IMPRESSION: Normal exam.  No evidence of pelvic mass or other significant abnormality.  No sonographic evidence for ovarian torsion.   Original Report Authenticated By: Harmon Pier, M.D.    [place] Art/ven Flow Abd Pelv Doppler  10/18/2012  *RADIOLOGY REPORT*  Clinical Data:  32 year old female with right pelvic pain.  History of right ectopic pregnancy and right salpingectomy.  TRANSABDOMINAL AND TRANSVAGINAL ULTRASOUND OF PELVIS DOPPLER ULTRASOUND OF OVARIES  Technique:  Both transabdominal and transvaginal ultrasound examinations of the pelvis were performed. Transabdominal technique was performed for global imaging of the pelvis including uterus, ovaries, adnexal regions, and pelvic cul-de-sac.  It was necessary to proceed with endovaginal exam following the transabdominal exam to visualize the ovaries and endometrium.  Color and duplex Doppler ultrasound was utilized to evaluate blood flow to the ovaries.  Comparison:  01/06/2007 pelvic ultrasound  Findings:  Uterus:  The uterus is anteverted measuring 6.9 x 3.8 x 5.1 cm.  No focal uterine masses are identified.  Endometrium:  The endometrial stripe is homogeneous measuring 13 mm in diameter.  Right ovary: The right ovary is normal in appearance and size measuring 3.4 x 2.9 x 2.8 cm.  A 1.3 x 1.7 cm cyst is identified. Normal color flow and arterial/venous waveforms are noted.  Left ovary:   The left ovary is unremarkable measuring 2.3 x 1.8 x 2.2 cm.  Normal color flow and arterial/venous waveforms are noted.  Pulsed Doppler evaluation demonstrates normal low-resistance arterial and venous waveforms in both ovaries.  IMPRESSION: Normal exam.  No evidence of pelvic mass or other significant abnormality.  No sonographic evidence for ovarian torsion.   Original Report Authenticated By: Harmon Pier, M.D.      1. Pelvic inflammatory disease     11:29 AM Patient seen and examined.  Work-up initiated. Medications ordered.   Vital signs reviewed and are as follows: Filed Vitals:   10/18/12 1057  BP: 121/88  Pulse: 92  Temp: 98.6 F (37 C)  Resp: 20   1:11 PM Pelvic exam performed with nurse chaperone Lillia Abed). [place] ordered.   3:12 PM ultrasound results reviewed. Patient informed. Will treat as pelvic inflammatory disease. Patient appears well, nontoxic. Pain is controlled. She is tolerating fluids. First dose of antibiotics given in emergency department.  Urged gynecology followup.  The patient was urged to return to the Emergency Department immediately with worsening of current symptoms, worsening abdominal pain, persistent vomiting, blood noted in stools, fever, or any other concerns. The patient verbalized understanding.   Patient counseled on use of narcotic pain medications.  Counseled not to combine these medications with others containing tylenol. Urged not to drink alcohol, drive, or perform any other activities that requires focus while taking these medications. The patient verbalizes understanding and agrees with the plan.   MDM  Patient with right lower and lower abdominal pain. She's not pregnant. Her labs are normal. Patient has purulent discharge from cervical os with cervical motion tenderness. Will treat for PID. Antibiotics given. Patient is tolerating fluids and is afebrile. She appears well and is appropriate for treatment as outpatient. Do not suspect appendicitis. No torsion on ultrasound. Patient had right-sided salpingectomy. Patient counseled on return instructions.       Renne Crigler, PA-C 10/18/12 1514

## 2012-10-18 NOTE — ED Notes (Signed)
UA reordered, due to Lab not being able to see order.  2nd requisition send.

## 2012-10-18 NOTE — ED Notes (Signed)
Patient transported to Ultrasound 

## 2012-10-19 LAB — GC/CHLAMYDIA PROBE AMP
CT Probe RNA: NEGATIVE
GC Probe RNA: NEGATIVE

## 2012-10-19 NOTE — ED Provider Notes (Signed)
Medical screening examination/treatment/procedure(s) were performed by non-physician practitioner and as supervising physician I was immediately available for consultation/collaboration.   Suzi Roots, MD 10/19/12 1116

## 2013-01-01 ENCOUNTER — Emergency Department (HOSPITAL_COMMUNITY)
Admission: EM | Admit: 2013-01-01 | Discharge: 2013-01-01 | Disposition: A | Discharge status: Home / Self Care | Payer: BC Managed Care – PPO | Attending: Emergency Medicine | Admitting: Emergency Medicine

## 2013-01-01 DIAGNOSIS — Z8742 Personal history of other diseases of the female genital tract: Secondary | ICD-10-CM | POA: Insufficient documentation

## 2013-01-01 DIAGNOSIS — H53149 Visual discomfort, unspecified: Secondary | ICD-10-CM | POA: Insufficient documentation

## 2013-01-01 DIAGNOSIS — R51 Headache: Secondary | ICD-10-CM | POA: Insufficient documentation

## 2013-01-01 DIAGNOSIS — F410 Panic disorder [episodic paroxysmal anxiety] without agoraphobia: Secondary | ICD-10-CM | POA: Insufficient documentation

## 2013-01-01 DIAGNOSIS — Z87891 Personal history of nicotine dependence: Secondary | ICD-10-CM | POA: Insufficient documentation

## 2013-01-01 DIAGNOSIS — H538 Other visual disturbances: Secondary | ICD-10-CM | POA: Insufficient documentation

## 2013-01-01 DIAGNOSIS — R110 Nausea: Secondary | ICD-10-CM | POA: Insufficient documentation

## 2013-01-01 DIAGNOSIS — F319 Bipolar disorder, unspecified: Secondary | ICD-10-CM | POA: Insufficient documentation

## 2013-01-01 MED ORDER — PROMETHAZINE HCL 25 MG PO TABS
25.0000 mg | ORAL_TABLET | ORAL | Status: AC
  Administered 2013-01-01: 25 mg via ORAL
  Filled 2013-01-01: qty 1

## 2013-01-01 MED ORDER — METOCLOPRAMIDE HCL 5 MG/ML IJ SOLN
10.0000 mg | Freq: Once | INTRAMUSCULAR | Status: AC
  Administered 2013-01-01: 10 mg via INTRAVENOUS
  Filled 2013-01-01: qty 2

## 2013-01-01 MED ORDER — LORAZEPAM 2 MG/ML IJ SOLN
1.0000 mg | Freq: Once | INTRAMUSCULAR | Status: AC
  Administered 2013-01-01: 1 mg via INTRAVENOUS
  Filled 2013-01-01: qty 1

## 2013-01-01 MED ORDER — SODIUM CHLORIDE 0.9 % IV SOLN
Freq: Once | INTRAVENOUS | Status: AC
  Administered 2013-01-01: 22:00:00 via INTRAVENOUS

## 2013-01-01 MED ORDER — KETOROLAC TROMETHAMINE 30 MG/ML IJ SOLN
15.0000 mg | Freq: Once | INTRAMUSCULAR | Status: AC
  Administered 2013-01-01: 15 mg via INTRAVENOUS
  Filled 2013-01-01: qty 1

## 2013-01-01 MED ORDER — BUTALBITAL-APAP-CAFFEINE 50-325-40 MG PO TABS: 1.0000 | ORAL_TABLET | Freq: Four times a day (QID) | ORAL | Status: DC | PRN

## 2013-01-01 MED ORDER — MORPHINE SULFATE 4 MG/ML IJ SOLN
4.0000 mg | Freq: Once | INTRAMUSCULAR | Status: AC
  Administered 2013-01-01: 4 mg via INTRAVENOUS
  Filled 2013-01-01: qty 1

## 2013-01-01 MED ORDER — SODIUM CHLORIDE 0.9 % IV BOLUS (SEPSIS)
500.0000 mL | Freq: Once | INTRAVENOUS | Status: AC
  Administered 2013-01-01: 500 mL via INTRAVENOUS

## 2013-01-01 NOTE — ED Provider Notes (Signed)
History    CSN: [id number] Arrival date & time 01/01/13  1916  First MD Initiated Contact with Patient 01/01/13 2000     Chief Complaint  Patient presents with  . Headache  . Anxiety   (Consider location/radiation/quality/duration/timing/severity/associated sxs/prior Treatment) HPI  Evelyn Lopez is a 32 y.o. female with PMH significant for panic d/o and bipolar c/o headache x3 days, indolent onset, significantly worsening today. Pain is right temporal and radiates to the neck, rated at 8/10 described as pulsating. She notes photophobia, photophobia, nausea without vomiting, and blurry vision in her R eye. She denies history of migraines. She has been taking ibuprofen on/off since onset of symptoms without relief. She also took a Xanax at noon without relief. She has a PMH of anxiety and Bipolar 1. On ROS she denies fever, chills/sweats, chest pain, SOB, numbness or tingling in extremities, dizziness, difficulty walking, difficulty urinating.    Past Medical History  Diagnosis Date  . Bipolar disorder   . Panic attacks   . Ectopic pregnancy     June 2013  . Ovarian cyst    Past Surgical History  Procedure Laterality Date  . Laparoscopy  12/29/2011    Procedure: LAPAROSCOPY OPERATIVE;  Surgeon: Tresa Endo A. Ernestina Penna, MD;  Location: [facility];  Service: Gynecology;  Laterality: Bilateral;  Right Salpingectomy   No family history on file. History  Substance Use Topics  . Smoking status: Former Games developer  . Smokeless tobacco: Not on file  . Alcohol Use: No   OB History   Grav Para Term Preterm Abortions TAB SAB Ect Mult Living   2 0 0 0 0 0 0 0 0 0      Review of Systems  Constitutional:       Negative except as described in HPI  HENT:       Negative except as described in HPI  Respiratory:       Negative except as described in HPI  Cardiovascular:       Negative except as described in HPI  Gastrointestinal:       Negative except as described in HPI  Genitourinary:   Negative except as described in HPI  Musculoskeletal:       Negative except as described in HPI  Skin:       Negative except as described in HPI  Neurological:       Negative except as described in HPI  All other systems reviewed and are negative.    Allergies  Review of patient's allergies indicates no known allergies.  Home Medications   Current Outpatient Rx  Name  Route  Sig  Dispense  Refill  . ALPRAZolam (XANAX) 0.5 MG tablet   Oral   Take 0.5 mg by mouth 4 (four) times daily as needed for anxiety.         Marland Kitchen ibuprofen (ADVIL,MOTRIN) 200 MG tablet   Oral   Take 800 mg by mouth every 6 (six) hours as needed for pain.          BP 145/102  Pulse 106  Temp(Src) 98.6 F (37 C) (Oral)  Resp 19  SpO2 100% Physical Exam  Nursing note and vitals reviewed. Constitutional: She is oriented to person, place, and time. She appears well-developed and well-nourished. No distress.  HENT:  Head: Normocephalic and atraumatic.  Mouth/Throat: Oropharynx is clear and moist.  Eyes: Conjunctivae and EOM are normal. Pupils are equal, round, and reactive to light.  Neck: Normal range of motion.  No midline  tenderness to palpation or step-offs appreciated. Patient has full range of motion without pain.  TTP of right paracervical musculature  Cardiovascular: Normal rate.   Pulmonary/Chest: Effort normal. No stridor.  Musculoskeletal: Normal range of motion.  Neurological: She is alert and oriented to person, place, and time. She displays normal reflexes.  Cranial nerves III through XII intact, strength 5 out of 5x4 extremities, negative pronator drift, finger to nose and heel-to-shin coordinated, sensation intact to pinprick and light touch, gait is coordinated and Romberg is negative.    Psychiatric: She has a normal mood and affect.    ED Course  Procedures (including critical care time) Labs Reviewed - No data to display No results found.  9:24 PM Pt seen and examined at the  beside, states pain is still 8/10  1. Headache     MDM   Filed Vitals:   01/01/13 1936  BP: 145/102  Pulse: 106  Temp: 98.6 F (37 C)  TempSrc: Oral  Resp: 19  SpO2: 100%     Evelyn Lopez is a 32 y.o. female  HA treated and improved while in ED.  Presentation isnon concerning for [facility], ICH, Meningitis, or temporal arteritis. Pt is afebrile with no focal neuro deficits, nuchal rigidity, or change in vision. Pt is to follow up with PCP to discuss prophylactic medication. Pt verbalizes understanding and is agreeable with plan to dc.   Medications  0.9 %  sodium chloride infusion (not administered)  sodium chloride 0.9 % bolus 500 mL (500 mLs Intravenous New Bag/Given 01/01/13 2109)  LORazepam (ATIVAN) injection 1 mg (1 mg Intravenous Given 01/01/13 2112)  ketorolac (TORADOL) 30 MG/ML injection 15 mg (15 mg Intravenous Given 01/01/13 2111)  metoCLOPramide (REGLAN) injection 10 mg (10 mg Intravenous Given 01/01/13 2110)    Pt is hemodynamically stable, appropriate for, and amenable to discharge at this time. Pt verbalized understanding and agrees with care plan. Outpatient follow-up and specific return precautions discussed.    Discharge Medication List as of 01/01/2013 10:45 PM    START taking these medications   Details  butalbital-acetaminophen-caffeine (FIORICET) 50-325-40 MG per tablet Take 1 tablet by mouth every 6 (six) hours as needed for headache., Starting 01/01/2013, Until Discontinued, [employer], PA-C 01/02/13 [phone]

## 2013-01-01 NOTE — ED Notes (Signed)
Pt c/o headache for the past 3 days. Pt states pain is now on the R side of her head and in her neck. Pain radiates behind R eye. Pt also c/o photophobia and slight nausea. Pt is tearful and states she feels like she is going to have a panic attack. Pt states she drove herself here.

## 2013-01-01 NOTE — ED Notes (Signed)
Pt reports having sensitivity to light.  Migraine has been going on for the past 3 days.

## 2013-01-05 NOTE — ED Provider Notes (Signed)
Medical screening examination/treatment/procedure(s) were performed by non-physician practitioner and as supervising physician I was immediately available for consultation/collaboration.  Lorren Splawn, MD 01/05/13 2325 

## 2014-04-16 ENCOUNTER — Encounter (HOSPITAL_COMMUNITY): Payer: Self-pay | Admitting: *Deleted

## 2014-04-23 ENCOUNTER — Emergency Department (HOSPITAL_BASED_OUTPATIENT_CLINIC_OR_DEPARTMENT_OTHER)
Admission: EM | Admit: 2014-04-23 | Discharge: 2014-04-23 | Disposition: A | Discharge status: Home / Self Care | Payer: 59 | Attending: Emergency Medicine | Admitting: Emergency Medicine

## 2014-04-23 ENCOUNTER — Encounter (HOSPITAL_BASED_OUTPATIENT_CLINIC_OR_DEPARTMENT_OTHER): Payer: Self-pay | Admitting: *Deleted

## 2014-04-23 DIAGNOSIS — Z8742 Personal history of other diseases of the female genital tract: Secondary | ICD-10-CM | POA: Diagnosis not present

## 2014-04-23 DIAGNOSIS — F410 Panic disorder [episodic paroxysmal anxiety] without agoraphobia: Secondary | ICD-10-CM | POA: Diagnosis not present

## 2014-04-23 DIAGNOSIS — Z87891 Personal history of nicotine dependence: Secondary | ICD-10-CM | POA: Insufficient documentation

## 2014-04-23 DIAGNOSIS — F319 Bipolar disorder, unspecified: Secondary | ICD-10-CM | POA: Insufficient documentation

## 2014-04-23 DIAGNOSIS — M545 Low back pain, unspecified: Secondary | ICD-10-CM

## 2014-04-23 DIAGNOSIS — G8929 Other chronic pain: Secondary | ICD-10-CM | POA: Insufficient documentation

## 2014-04-23 HISTORY — DX: Other chronic pain: G89.29

## 2014-04-23 HISTORY — DX: Dorsalgia, unspecified: M54.9

## 2014-04-23 LAB — URINE MICROSCOPIC-ADD ON

## 2014-04-23 LAB — URINALYSIS, ROUTINE W REFLEX MICROSCOPIC
Bilirubin Urine: NEGATIVE
Glucose, UA: NEGATIVE mg/dL
Hgb urine dipstick: NEGATIVE
Ketones, ur: NEGATIVE mg/dL
Nitrite: NEGATIVE
Protein, ur: NEGATIVE mg/dL
Specific Gravity, Urine: 1.017 (ref 1.005–1.030)
Urobilinogen, UA: 0.2 mg/dL (ref 0.0–1.0)
pH: 6 (ref 5.0–8.0)

## 2014-04-23 LAB — PREGNANCY, URINE: Preg Test, Ur: NEGATIVE

## 2014-04-23 MED ORDER — IBUPROFEN 600 MG PO TABS: 600.0000 mg | ORAL_TABLET | Freq: Four times a day (QID) | ORAL | Status: DC | PRN

## 2014-04-23 MED ORDER — METHOCARBAMOL 500 MG PO TABS
1000.0000 mg | ORAL_TABLET | Freq: Once | ORAL | Status: AC
  Administered 2014-04-23: 1000 mg via ORAL
  Filled 2014-04-23: qty 2

## 2014-04-23 MED ORDER — METHOCARBAMOL 500 MG PO TABS: 1000.0000 mg | ORAL_TABLET | Freq: Four times a day (QID) | ORAL | Status: DC

## 2014-04-23 MED ORDER — HYDROCODONE-ACETAMINOPHEN 5-325 MG PO TABS: ORAL_TABLET | ORAL | Status: DC

## 2014-04-23 MED ORDER — HYDROCODONE-ACETAMINOPHEN 5-325 MG PO TABS
1.0000 | ORAL_TABLET | Freq: Once | ORAL | Status: AC
  Administered 2014-04-23: 1 via ORAL
  Filled 2014-04-23: qty 1

## 2014-04-23 NOTE — Discharge Instructions (Signed)
Please read and follow all provided instructions.  Your diagnoses today include:  1. Bilateral low back pain without sciatica     Tests performed today include:  Vital signs - see below for your results today  Urine test - no urinary tract infection or blood to suggest kidney stone  Medications prescribed:   Vicodin (hydrocodone/acetaminophen) - narcotic pain medication  DO NOT drive or perform any activities that require you to be awake and alert because this medicine can make you drowsy. BE VERY CAREFUL not to take multiple medicines containing Tylenol (also called acetaminophen). Doing so can lead to an overdose which can damage your liver and cause liver failure and possibly death.   Robaxin (methocarbamol) - muscle relaxer medication  DO NOT drive or perform any activities that require you to be awake and alert because this medicine can make you drowsy.    Ibuprofen (Motrin, Advil) - anti-inflammatory pain medication  Do not exceed 600mg  ibuprofen every 6 hours, take with food  You have been prescribed an anti-inflammatory medication or NSAID. Take with food. Take smallest effective dose for the shortest duration needed for your pain. Stop taking if you experience stomach pain or vomiting.   Take any prescribed medications only as directed.  Home care instructions:   Follow any educational materials contained in this packet  Please rest, use ice or heat on your back for the next several days  Do not lift, push, pull anything more than 10 pounds for the next week  Follow-up instructions: Please follow-up with your primary care provider in the next 1 week for further evaluation of your symptoms.   Return instructions:  SEEK IMMEDIATE MEDICAL ATTENTION IF YOU HAVE:  New numbness, tingling, weakness, or problem with the use of your arms or legs  Severe back pain not relieved with medications  Loss control of your bowels or bladder  Increasing pain in any areas of  the body (such as chest or abdominal pain)  Shortness of breath, dizziness, or fainting.   Worsening nausea (feeling sick to your stomach), vomiting, fever, or sweats  Any other emergent concerns regarding your health   Additional Information:  Your vital signs today were: BP 158/95 mmHg   Pulse 70   Temp(Src) 98.3 F (36.8 C) (Oral)   Resp 20   Ht 5\' 8"  (1.727 m)   Wt 265 lb (120.203 kg)   BMI 40.30 kg/m2   SpO2 98%   LMP 04/06/2014 If your blood pressure (BP) was elevated above 135/85 this visit, please have this repeated by your doctor within one month. --------------

## 2014-04-23 NOTE — ED Notes (Signed)
Chronic back pain. Pain in her lower back for a week. No injury. Denies dysuria. Pain started after bending over to tie her shoes.

## 2014-04-23 NOTE — ED Provider Notes (Signed)
CSN: [id number]     Arrival date & time 04/23/14  1849 History   First MD Initiated Contact with Patient 04/23/14 1912     Chief Complaint  Patient presents with  . Back Pain     (Consider location/radiation/quality/duration/timing/severity/associated sxs/prior Treatment) HPI Comments: Patient with chronic mid back pain presents with complaint of bilateral lower back pain with radiation into her buttocks and not into her legs for the past 4 days. Patient states that the pain began acutely after bending over to tie her shoes. She states that she is unable to straighten up fully or stand up straight. She is ambulatory. Patient denies warning symptoms of back pain including: fecal incontinence, urinary retention or overflow incontinence, night sweats, waking from sleep with back pain, unexplained fevers or weight loss, h/o cancer, IVDU, recent trauma.  No lower extremity weakness or numbness. She has been using heat on her back, taking ibuprofen without relief.   The history is provided by the patient.    Past Medical History  Diagnosis Date  . Bipolar disorder   . Panic attacks   . Ectopic pregnancy     June 2013  . Ovarian cyst   . Chronic back pain    Past Surgical History  Procedure Laterality Date  . Laparoscopy  12/29/2011    Procedure: LAPAROSCOPY OPERATIVE;  Surgeon: Tresa EndoKelly A. Ernestina PennaFogleman, MD;  Location: [facility];  Service: Gynecology;  Laterality: Bilateral;  Right Salpingectomy   No family history on file. History  Substance Use Topics  . Smoking status: Former Games developermoker  . Smokeless tobacco: Not on file  . Alcohol Use: No   OB History    Gravida Para Term Preterm AB TAB SAB Ectopic Multiple Living   2 0 0 0 0 0 0 0 0 0      Review of Systems  Constitutional: Negative for fever and unexpected weight change.  Gastrointestinal: Negative for constipation.       Negative for fecal incontinence.   Genitourinary: Negative for dysuria, hematuria, flank pain, vaginal bleeding,  vaginal discharge and pelvic pain.       Negative for urinary incontinence or retention.  Musculoskeletal: Positive for back pain.  Neurological: Negative for weakness and numbness.       Denies saddle paresthesias.      Allergies  Review of patient's allergies indicates no known allergies.  Home Medications   Prior to Admission medications   Medication Sig Start Date End Date Taking? Authorizing Provider  hydroxychloroquine (PLAQUENIL) 200 MG tablet Take by mouth daily.   Yes Historical Provider, MD  PREDNISONE PO Take by mouth.   Yes Historical Provider, MD  QUEtiapine Fumarate (SEROQUEL PO) Take by mouth.   Yes Historical Provider, MD  ALPRAZolam Prudy Feeler(XANAX) 0.5 MG tablet Take 0.5 mg by mouth 4 (four) times daily as needed for anxiety.    Historical Provider, MD  butalbital-acetaminophen-caffeine (FIORICET) 50-325-40 MG per tablet Take 1 tablet by mouth every 6 (six) hours as needed for headache. 01/01/13   Joni ReiningNicole Pisciotta, PA-C  ibuprofen (ADVIL,MOTRIN) 200 MG tablet Take 800 mg by mouth every 6 (six) hours as needed for pain.    Historical Provider, MD   BP 158/95 mmHg  Pulse 70  Temp(Src) 98.3 F (36.8 C) (Oral)  Resp 20  Ht 5\' 8"  (1.727 m)  Wt 265 lb (120.203 kg)  BMI 40.30 kg/m2  SpO2 98%  LMP 04/06/2014 Physical Exam  Constitutional: She appears well-developed and well-nourished.  HENT:  Head: Normocephalic and atraumatic.  Eyes:  Conjunctivae are normal.  Neck: Normal range of motion. Neck supple.  Pulmonary/Chest: Effort normal.  Abdominal: Soft. There is no tenderness. There is no CVA tenderness.  Musculoskeletal: Normal range of motion.       Right hip: Normal.       Left hip: Normal.       Cervical back: Normal.       Thoracic back: She exhibits bony tenderness. She exhibits normal range of motion and no tenderness.       Lumbar back: She exhibits tenderness. She exhibits normal range of motion and no bony tenderness.       Back:  No step-off noted with  palpation of spine.   Neurological: She is alert. She has normal strength and normal reflexes. No sensory deficit.  5/5 strength in entire lower extremities bilaterally. No sensation deficit.   Skin: Skin is warm and dry. No rash noted.  Psychiatric: She has a normal mood and affect.  Nursing note and vitals reviewed.   ED Course  Procedures (including critical care time) Labs Review Labs Reviewed  PREGNANCY, URINE  URINALYSIS, ROUTINE W REFLEX MICROSCOPIC    Imaging Review No results found.   EKG Interpretation None      7:47 PM Patient seen and examined. Work-up initiated. Medications ordered. Pending UA.   Vital signs reviewed and are as follows: Filed Vitals:   04/23/14 1900  BP: 158/95  Pulse: 70  Temp: 98.3 F (36.8 C)  Resp: 20   8:43 PM Patient informed of UA results.   No red flag s/s of low back pain. Patient was counseled on back pain precautions and told to do activity as tolerated but do not lift, push, or pull heavy objects more than 10 pounds for the next week.  Patient counseled to use ice or heat on back for no longer than 15 minutes every hour.   Patient prescribed muscle relaxer and counseled on proper use of muscle relaxant medication.    Patient prescribed narcotic pain medicine and counseled on proper use of narcotic pain medications. Counseled not to combine this medication with others containing tylenol.   Urged patient not to drink alcohol, drive, or perform any other activities that requires focus while taking either of these medications.  Patient urged to follow-up with PCP if pain does not improve with treatment and rest or if pain becomes recurrent. Urged to return with worsening severe pain, loss of bowel or bladder control, trouble walking.   The patient verbalizes understanding and agrees with the plan.   MDM   Final diagnoses:  Bilateral low back pain without sciatica   Patient with back pain. No neurological deficits. Patient is  ambulatory. No warning symptoms of back pain including: fecal incontinence, urinary retention or overflow incontinence, night sweats, waking from sleep with back pain, unexplained fevers or weight loss, h/o cancer, IVDU, recent trauma. No concern for cauda equina, epidural abscess, or other serious cause of back pain. Conservative measures such as rest, ice/heat and pain medicine indicated with PCP follow-up if no improvement with conservative management.      Renne CriglerJoshua Briauna Gilmartin, PA-C 04/23/14 2043  Layla MawKristen N Ward, DO 04/23/14 2351

## 2015-06-05 ENCOUNTER — Emergency Department (HOSPITAL_BASED_OUTPATIENT_CLINIC_OR_DEPARTMENT_OTHER): Payer: Medicaid Other

## 2015-06-05 ENCOUNTER — Emergency Department (HOSPITAL_BASED_OUTPATIENT_CLINIC_OR_DEPARTMENT_OTHER)
Admission: EM | Admit: 2015-06-05 | Discharge: 2015-06-05 | Disposition: A | Discharge status: Home / Self Care | Payer: Medicaid Other | Attending: Emergency Medicine | Admitting: Emergency Medicine

## 2015-06-05 ENCOUNTER — Encounter (HOSPITAL_BASED_OUTPATIENT_CLINIC_OR_DEPARTMENT_OTHER): Payer: Self-pay

## 2015-06-05 DIAGNOSIS — F17200 Nicotine dependence, unspecified, uncomplicated: Secondary | ICD-10-CM | POA: Insufficient documentation

## 2015-06-05 DIAGNOSIS — F319 Bipolar disorder, unspecified: Secondary | ICD-10-CM | POA: Insufficient documentation

## 2015-06-05 DIAGNOSIS — J0110 Acute frontal sinusitis, unspecified: Secondary | ICD-10-CM

## 2015-06-05 DIAGNOSIS — G8929 Other chronic pain: Secondary | ICD-10-CM | POA: Insufficient documentation

## 2015-06-05 DIAGNOSIS — Z79899 Other long term (current) drug therapy: Secondary | ICD-10-CM | POA: Insufficient documentation

## 2015-06-05 DIAGNOSIS — J069 Acute upper respiratory infection, unspecified: Secondary | ICD-10-CM

## 2015-06-05 DIAGNOSIS — F410 Panic disorder [episodic paroxysmal anxiety] without agoraphobia: Secondary | ICD-10-CM | POA: Insufficient documentation

## 2015-06-05 DIAGNOSIS — Z3202 Encounter for pregnancy test, result negative: Secondary | ICD-10-CM | POA: Insufficient documentation

## 2015-06-05 LAB — PREGNANCY, URINE: Preg Test, Ur: NEGATIVE

## 2015-06-05 MED ORDER — AMOXICILLIN-POT CLAVULANATE 875-125 MG PO TABS: 1.0000 | ORAL_TABLET | Freq: Two times a day (BID) | ORAL | Status: DC

## 2015-06-05 MED ORDER — ALBUTEROL SULFATE HFA 108 (90 BASE) MCG/ACT IN AERS
1.0000 | INHALATION_SPRAY | Freq: Once | RESPIRATORY_TRACT | Status: AC
  Administered 2015-06-05: 1 via RESPIRATORY_TRACT
  Filled 2015-06-05: qty 6.7

## 2015-06-05 NOTE — ED Notes (Signed)
C/o cough, congestion, head pressure chills, runny nose and wheezing when laying down x 2 weeks

## 2015-06-05 NOTE — ED Notes (Signed)
C/o prod cough, head and chest congestion, chills x 2 weeks-NAD

## 2015-06-05 NOTE — Discharge Instructions (Signed)
Sinus Rinse WHAT IS A SINUS RINSE? A sinus rinse is a simple home treatment that is used to rinse your sinuses with a sterile mixture of salt and water (saline solution). Sinuses are air-filled spaces in your skull behind the bones of your face and forehead that open into your nasal cavity. You will use the following:  Saline solution.  Neti pot or spray bottle. This releases the saline solution into your nose and through your sinuses. Neti pots and spray bottles can be purchased at [employer], a health food store, or online. WHEN WOULD I DO A SINUS RINSE? A sinus rinse can help to clear mucus, dirt, dust, or pollen from the nasal cavity. You may do a sinus rinse when you have a cold, a virus, nasal allergy symptoms, a sinus infection, or stuffiness in the nose or sinuses. If you are considering a sinus rinse:  Ask your child's health care provider before performing a sinus rinse on your child.  Do not do a sinus rinse if you have had ear or nasal surgery, ear infection, or blocked ears. HOW DO I DO A SINUS RINSE?  Wash your hands.  Disinfect your device according to the directions provided and then dry it.  Use the solution that comes with your device or one that is sold separately in stores. Follow the mixing directions on the package.  Fill your device with the amount of saline solution as directed by the device instructions.  Stand over a sink and tilt your head sideways over the sink.  Place the spout of the device in your upper nostril (the one closer to the ceiling).  Gently pour or squeeze the saline solution into the nasal cavity. The liquid should drain to the lower nostril if you are not overly congested.  Gently blow your nose. Blowing too hard may cause ear pain.  Repeat in the other nostril.  Clean and rinse your device with clean water and then air-dry it. ARE THERE RISKS OF A SINUS RINSE?  Sinus rinse is generally very safe and effective. However, there  are a few risks, which include:   A burning sensation in the sinuses. This may happen if you do not make the saline solution as directed. Make sure to follow all directions when making the saline solution.  Infection from contaminated water. This is rare, but possible.  Nasal irritation.   This information is not intended to replace advice given to you by your health care provider. Make sure you discuss any questions you have with your health care provider.   Document Released: 12/27/2013 Document Reviewed: 12/27/2013 Elsevier Interactive Patient Education 2016 Else[employer] Inc[employer]  Cough, Adult A cough helps to clear your throat and lungs. A cough may last only 2-3 weeks (acute), or it may last longer than 8 weeks (chronic). Many different things can cause a cough. A cough may be a sign of an illness or another medical condition. HOME CARE  Pay attention to any changes in your cough.  Take medicines only as told by your doctor.  If you were prescribed an antibiotic medicine, take it as told by your doctor. Do not stop taking it even if you start to feel better.  Talk with your doctor before you try using a cough medicine.  Drink enough fluid to keep your pee (urine) clear or pale yellow.  If the air is dry, use a cold steam vaporizer or humidifier in your home.  Stay away from things that make you  cough at work or at home.  If your cough is worse at night, try using extra pillows to raise your head up higher while you sleep.  Do not smoke, and try not to be around smoke. If you need help quitting, ask your doctor.  Do not have caffeine.  Do not drink alcohol.  Rest as needed. GET HELP IF:  You have new problems (symptoms).  You cough up yellow fluid (pus).  Your cough does not get better after 2-3 weeks, or your cough gets worse.  Medicine does not help your cough and you are not sleeping well.  You have pain that gets worse or pain that is not helped with  medicine.  You have a fever.  You are losing weight and you do not know why.  You have night sweats. GET HELP RIGHT AWAY IF:  You cough up blood.  You have trouble breathing.  Your heartbeat is very fast.   This information is not intended to replace advice given to you by your health care provider. Make sure you discuss any questions you have with your health care provider.   Document Released: 02/12/2011 Document Revised: 02/20/2015 Document Reviewed: 08/08/2014 Elsevier Interactive Patient Education 2016 Else[employer] Inc.  Sinusitis, Adult Sinusitis is redness, soreness, and puffiness (inflammation) of the air pockets in the bones of your face (sinuses). The redness, soreness, and puffiness can cause air and mucus to get trapped in your sinuses. This can allow germs to grow and cause an infection.  HOME CARE   Drink enough fluids to keep your pee (urine) clear or pale yellow.  Use a humidifier in your home.  Run a hot shower to create steam in the bathroom. Sit in the bathroom with the door closed. Breathe in the steam 3-4 times a day.  Put a warm, moist washcloth on your face 3-4 times a day, or as told by your doctor.  Use salt water sprays (saline sprays) to wet the thick fluid in your nose. This can help the sinuses drain.  Only take medicine as told by your doctor. GET HELP RIGHT AWAY IF:   Your pain gets worse.  You have very bad headaches.  You are sick to your stomach (nauseous).  You throw up (vomit).  You are very sleepy (drowsy) all the time.  Your face is puffy (swollen).  Your vision changes.  You have a stiff neck.  You have trouble breathing. MAKE SURE YOU:   Understand these instructions.  Will watch your condition.  Will get help right away if you are not doing well or get worse.   This information is not intended to replace advice given to you by your health care provider. Make sure you discuss any questions you have with your health  care provider.   Document Released: 11/18/2007 Document Revised: 06/22/2014 Document Reviewed: 01/05/2012 Elsevier Interactive Patient Education 2016 Elsevier Inc.  Upper Respiratory Infection, Adult Most upper respiratory infections (URIs) are caused by a virus. A URI affects the nose, throat, and upper air passages. The most common type of URI is often called "the common cold." HOME CARE   Take medicines only as told by your doctor.  Gargle warm saltwater or take cough drops to comfort your throat as told by your doctor.  Use a warm mist humidifier or inhale steam from a shower to increase air moisture. This may make it easier to breathe.  Drink enough fluid to keep your pee (urine) clear or pale yellow.  Eat  soups and other clear broths.  Have a healthy diet.  Rest as needed.  Go back to work when your fever is gone or your doctor says it is okay.  You may need to stay home longer to avoid giving your URI to others.  You can also wear a face mask and wash your hands often to prevent spread of the virus.  Use your inhaler more if you have asthma.  Do not use any tobacco products, including cigarettes, chewing tobacco, or electronic cigarettes. If you need help quitting, ask your doctor. GET HELP IF:  You are getting worse, not better.  Your symptoms are not helped by medicine.  You have chills.  You are getting more short of breath.  You have brown or red mucus.  You have yellow or brown discharge from your nose.  You have pain in your face, especially when you bend forward.  You have a fever.  You have puffy (swollen) neck glands.  You have pain while swallowing.  You have white areas in the back of your throat. GET HELP RIGHT AWAY IF:   You have very bad or constant:  Headache.  Ear pain.  Pain in your forehead, behind your eyes, and over your cheekbones (sinus pain).  Chest pain.  You have long-lasting (chronic) lung disease and any of the  following:  Wheezing.  Long-lasting cough.  Coughing up blood.  A change in your usual mucus.  You have a stiff neck.  You have changes in your:  Vision.  Hearing.  Thinking.  Mood. MAKE SURE YOU:   Understand these instructions.  Will watch your condition.  Will get help right away if you are not doing well or get worse.   This information is not intended to replace advice given to you by your health care provider. Make sure you discuss any questions you have with your health care provider.   Follow-up with your primary care doctor in 3-4 days if symptoms do not improve. Take antibiotics as prescribed. Continue home Mucinex, Sudafed, nasal rinsing. Turn to the emergency department if you experience severe worsening of your symptoms, difficulty breathing, fever, vomiting, chest pain.

## 2015-06-06 NOTE — ED Provider Notes (Signed)
CSN: [id number]     Arrival date & time 06/05/15  1810 History   First MD Initiated Contact with Patient 06/05/15 1931     Chief Complaint  Patient presents with  . Cough     (Consider location/radiation/quality/duration/timing/severity/associated sxs/prior Treatment) HPI  Evelyn Lopez is a 34 y.o F who presents to the ED c/o cough, congestion and sinus pressure. Pt states that she had been having symptoms of a productive cough with associated sinus congestion, rhinorrhea with purulent nasal discharge, sinus pressure, frontal headaches and subjective fevers for 2 weeks. Pt has tried OTC mucinex, sudafed, ibuprofen, nasal rinsing with no relief. No associated SOB. Denies N/V/D, dizziness, sore throat, chest pain.   Past Medical History  Diagnosis Date  . Bipolar disorder (HCC)   . Panic attacks   . Ectopic pregnancy     June 2013  . Ovarian cyst   . Chronic back pain    Past Surgical History  Procedure Laterality Date  . Laparoscopy  12/29/2011    Procedure: LAPAROSCOPY OPERATIVE;  Surgeon: Tresa EndoKelly A. Ernestina PennaFogleman, MD;  Location: [facility];  Service: Gynecology;  Laterality: Bilateral;  Right Salpingectomy   No family history on file. Social History  Substance Use Topics  . Smoking status: Current Every Day Smoker  . Smokeless tobacco: None  . Alcohol Use: No   OB History    Gravida Para Term Preterm AB TAB SAB Ectopic Multiple Living   2 0 0 0 0 0 0 0 0 0      Review of Systems  All other systems reviewed and are negative.     Allergies  Review of patient's allergies indicates no known allergies.  Home Medications   Prior to Admission medications   Medication Sig Start Date End Date Taking? Authorizing Provider  ALPRAZolam Prudy Feeler(XANAX) 0.5 MG tablet Take 0.5 mg by mouth 4 (four) times daily as needed for anxiety.    Historical Provider, MD  amoxicillin-clavulanate (AUGMENTIN) 875-125 MG tablet Take 1 tablet by mouth every 12 (twelve) hours. 06/05/15   [place] Grundman Tripp Draylen Lobue,  PA-C   BP 135/80 mmHg  Pulse 98  Temp(Src) 98.5 F (36.9 C) (Oral)  Resp 18  Ht 5\' 9"  (1.753 m)  Wt 96.616 kg  BMI 31.44 kg/m2  SpO2 100%  LMP 05/16/2015 Physical Exam  Constitutional: She is oriented to person, place, and time. She appears well-developed and well-nourished. No distress.  HENT:  Head: Normocephalic and atraumatic.  Right Ear: Tympanic membrane normal.  Left Ear: Tympanic membrane normal.  Nose: Mucosal edema and rhinorrhea present. No epistaxis. Right sinus exhibits maxillary sinus tenderness and frontal sinus tenderness. Left sinus exhibits maxillary sinus tenderness and frontal sinus tenderness.  Mouth/Throat: Uvula is midline and oropharynx is clear and moist. No trismus in the jaw. No uvula swelling. No oropharyngeal exudate, posterior oropharyngeal edema or posterior oropharyngeal erythema.  Eyes: Conjunctivae and EOM are normal. Pupils are equal, round, and reactive to light. Right eye exhibits no discharge. Left eye exhibits no discharge. No scleral icterus.  Cardiovascular: Normal rate, regular rhythm, normal heart sounds and intact distal pulses.  Exam reveals no gallop and no friction rub.   No murmur heard. Pulmonary/Chest: Effort normal and breath sounds normal. No respiratory distress. She has no wheezes. She has no rales. She exhibits no tenderness.  Abdominal: Soft. She exhibits no distension. There is no tenderness. There is no guarding.  Musculoskeletal: Normal range of motion. She exhibits no edema.  Neurological: She is alert and oriented to person, place,  and time. No cranial nerve deficit.  Skin: Skin is warm and dry. No rash noted. She is not diaphoretic. No erythema. No pallor.  Psychiatric: She has a normal mood and affect. Her behavior is normal.  Nursing note and vitals reviewed.   ED Course  Procedures (including critical care time) Labs Review Labs Reviewed  PREGNANCY, URINE    Imaging Review Dg Chest 2 View  06/05/2015  CLINICAL  DATA:  Cough, congestion for 2 weeks EXAM: CHEST  2 VIEW COMPARISON:  12/29/2003 FINDINGS: The heart size and mediastinal contours are within normal limits. Both lungs are clear. The visualized skeletal structures are unremarkable. IMPRESSION: No acute cardiopulmonary disease. Electronically Signed   By: Elige Ko   On: 06/05/2015 20:02   I have personally reviewed and evaluated these images and lab results as part of my medical decision-making.   EKG Interpretation None      MDM   Final diagnoses:  Acute frontal sinusitis, recurrence not specified  URI (upper respiratory infection)    Patient complaining of symptoms of sinusitis.  Severe symptoms have been present for greater than 10 days with purulent nasal discharge and maxillary sinus pain.  Concern for acute bacterial rhinosinusitis. Patient discharged with Augmentin. Given productive cough hx, CXR obtained which was normal. Pt afebrile. No hypoxia. All VSS. Instructions given for warm saline nasal wash and recommendations for follow-up with primary care physician.           Lester Kinsman [place], PA-C 06/06/15 1815  Rolland Porter, MD 06/09/15 [id number]

## 2015-06-30 ENCOUNTER — Encounter (HOSPITAL_BASED_OUTPATIENT_CLINIC_OR_DEPARTMENT_OTHER): Payer: Self-pay | Admitting: Emergency Medicine

## 2015-06-30 ENCOUNTER — Emergency Department (HOSPITAL_BASED_OUTPATIENT_CLINIC_OR_DEPARTMENT_OTHER): Payer: PRIVATE HEALTH INSURANCE

## 2015-06-30 ENCOUNTER — Emergency Department (HOSPITAL_BASED_OUTPATIENT_CLINIC_OR_DEPARTMENT_OTHER)
Admission: EM | Admit: 2015-06-30 | Discharge: 2015-06-30 | Disposition: A | Discharge status: Home / Self Care | Payer: PRIVATE HEALTH INSURANCE | Attending: Emergency Medicine | Admitting: Emergency Medicine

## 2015-06-30 DIAGNOSIS — J029 Acute pharyngitis, unspecified: Secondary | ICD-10-CM | POA: Insufficient documentation

## 2015-06-30 DIAGNOSIS — G8929 Other chronic pain: Secondary | ICD-10-CM | POA: Diagnosis not present

## 2015-06-30 DIAGNOSIS — F410 Panic disorder [episodic paroxysmal anxiety] without agoraphobia: Secondary | ICD-10-CM | POA: Insufficient documentation

## 2015-06-30 DIAGNOSIS — R599 Enlarged lymph nodes, unspecified: Secondary | ICD-10-CM

## 2015-06-30 DIAGNOSIS — F17200 Nicotine dependence, unspecified, uncomplicated: Secondary | ICD-10-CM | POA: Diagnosis not present

## 2015-06-30 DIAGNOSIS — Z8742 Personal history of other diseases of the female genital tract: Secondary | ICD-10-CM | POA: Insufficient documentation

## 2015-06-30 LAB — RAPID STREP SCREEN (MED CTR MEBANE ONLY): Streptococcus, Group A Screen (Direct): NEGATIVE

## 2015-06-30 MED ORDER — HYDROCODONE-ACETAMINOPHEN 5-325 MG PO TABS
1.0000 | ORAL_TABLET | Freq: Once | ORAL | Status: AC
  Administered 2015-06-30: 1 via ORAL
  Filled 2015-06-30: qty 1

## 2015-06-30 MED ORDER — AMOXICILLIN-POT CLAVULANATE 875-125 MG PO TABS: 1.0000 | ORAL_TABLET | Freq: Two times a day (BID) | ORAL | Status: DC

## 2015-06-30 NOTE — ED Provider Notes (Signed)
CSN: [id number]     Arrival date & time 06/30/15  1410 History  By signing my name below, I, Placido Sou, attest that this documentation has been prepared under the direction and in the presence of Glynn Octave, MD. Electronically Signed: Placido Sou, ED Scribe. 06/30/2015. 3:09 PM.    Chief Complaint  Patient presents with  . Swollen lymph node to neck   . Sore Throat   The history is provided by the patient. No language interpreter was used.    HPI Comments: Evelyn Lopez is a 35 y.o. female who presents to the Emergency Department complaining of a point of swelling to her left neck with onset this morning. She notes taking ibuprofen 2x today which provided little relief. She notes associated, moderate, pain to the affected region that worsens when swallowing or moving her neck. Pt denies any recent travel. She notes a recent sinus and respiratory infection 1 month ago which she completed abx for further noting her symptoms have alleviated. Pt endorses a hx of bipolar disorder but denies any there known medical conditions. She denies fevers, chills, dental pain, n/v, cough, SOB, difficulty swallowing, abd pain or other associated symptoms at this time.     Past Medical History  Diagnosis Date  . Bipolar disorder (HCC)   . Panic attacks   . Ectopic pregnancy     June 2013  . Ovarian cyst   . Chronic back pain    Past Surgical History  Procedure Laterality Date  . Laparoscopy  12/29/2011    Procedure: LAPAROSCOPY OPERATIVE;  Surgeon: Tresa Endo A. Ernestina Penna, MD;  Location: [facility];  Service: Gynecology;  Laterality: Bilateral;  Right Salpingectomy   History reviewed. No pertinent family history. Social History  Substance Use Topics  . Smoking status: Current Every Day Smoker  . Smokeless tobacco: None  . Alcohol Use: No   OB History    Gravida Para Term Preterm AB TAB SAB Ectopic Multiple Living   2 0 0 0 0 0 0 0 0 0      Review of Systems A complete 10 system review of  systems was obtained and all systems are negative except as noted in the HPI and PMH.   Allergies  Review of patient's allergies indicates no known allergies.  Home Medications   Prior to Admission medications   Medication Sig Start Date End Date Taking? Authorizing Provider  ALPRAZolam Prudy Feeler) 0.5 MG tablet Take 0.5 mg by mouth 4 (four) times daily as needed for anxiety.    Historical Provider, MD  amoxicillin-clavulanate (AUGMENTIN) 875-125 MG tablet Take 1 tablet by mouth every 12 (twelve) hours. 06/30/15   Glynn Octave, MD   BP 142/103 mmHg  Pulse 94  Temp(Src) 98.4 F (36.9 C) (Oral)  Resp 18  Ht 5\' 9"  (1.753 m)  Wt 215 lb (97.523 kg)  BMI 31.74 kg/m2  SpO2 100%  LMP 06/16/2015    Physical Exam  Constitutional: She is oriented to person, place, and time. She appears well-developed and well-nourished. No distress.  HENT:  Head: Normocephalic and atraumatic.  Mouth/Throat: Oropharynx is clear and moist. No oropharyngeal exudate.  Eyes: Conjunctivae and EOM are normal. Pupils are equal, round, and reactive to light.  Neck: Normal range of motion. Neck supple.  No meningismus. Mild left facial swelling beneath mandible. No erythema. Floor of mouth is soft. No asymmetry. Missing left lower molar. No dental tenderness.   Cardiovascular: Normal rate, regular rhythm, normal heart sounds and intact distal pulses.   No  murmur heard. Pulmonary/Chest: Effort normal and breath sounds normal. No respiratory distress. She has no wheezes. She has no rales.  Abdominal: Soft. There is no tenderness. There is no rebound and no guarding.  Musculoskeletal: Normal range of motion. She exhibits no edema or tenderness.  Neurological: She is alert and oriented to person, place, and time. No cranial nerve deficit. She exhibits normal muscle tone. Coordination normal.  No ataxia on finger to nose bilaterally. No pronator drift. 5/5 strength throughout. CN 2-12 intact.Equal grip strength. Sensation  intact.   Skin: Skin is warm.  Psychiatric: She has a normal mood and affect. Her behavior is normal.  Nursing note and vitals reviewed.   ED Course  Procedures  DIAGNOSTIC STUDIES: Oxygen Saturation is 100% on RA, normal by my interpretation.    COORDINATION OF CARE: 3:09 PM Discussed next steps with pt and she agreed to the plan.   Labs Review Labs Reviewed  RAPID STREP SCREEN (NOT AT [facility])  CULTURE, GROUP A STREP [facility])    Imaging Review Ct Soft Tissue Neck Wo Contrast  06/30/2015  CLINICAL DATA:  Left submandibular swelling and pain with palpation today. No difficulty swallowing or breathing. Recent cold symptoms with antibiotic treatment 1 month ago. EXAM: CT NECK WITHOUT CONTRAST TECHNIQUE: Multidetector CT imaging of the neck was performed following the standard protocol without intravenous contrast. A capsule was placed over the patient's palpable concern in the left submandibular region. COMPARISON:  None. FINDINGS: Pharynx and larynx: No lesions of the pharyngeal mucosal space are identified. No evidence of airway compromise. Salivary glands: The parotid and submandibular glands appear normal. However, there is submandibular inflammatory change on the left with soft tissue stranding throughout the subcutaneous fat. There is soft tissue nodularity anterior to the left submandibular gland measuring up to 1.9 x 1.3 cm on image 58, likely an inflamed submandibular lymph node. No focal fluid collection or evidence of sialolithiasis. Thyroid: Within normal limits in size. No focal abnormalities identified. Lymph nodes: As above, there are probable mildly enlarged and inflamed submandibular nodes on the left. There are small level 2 and 3 cervical lymph nodes bilaterally which are not pathologically enlarged. Small submental nodes are also noted. Vascular: No significant vascular findings on noncontrast imaging. Limited intracranial: Unremarkable. Visualized orbits: Unremarkable. Mastoids  and visualized paranasal sinuses: Mild mucosal thickening medially in the left maxillary sinus. The visualized paranasal sinuses, mastoid air cells and middle ears are otherwise clear. Skeleton: No acute or significant osseous findings. There is a missing left mandibular molar, but no significant active periodontal disease. Upper chest: The lung apices and visualized superior mediastinum appear unremarkable. IMPRESSION: 1. Left submandibular soft tissue nodularity with surrounding inflammation, likely representing adenitis and cellulitis. Clinical follow up recommended. 2. The submandibular and parotid glands appear normal. No evidence of sialolithiasis. Electronically Signed   By: Carey BullocksWilliam  Veazey M.D.   On: 06/30/2015 17:00   I have personally reviewed and evaluated these lab results as part of my medical decision-making.   EKG Interpretation None      MDM   Final diagnoses:  Swollen lymph nodes   Swelling to left neck and jaw since this morning. + Sore throat. No fever. No dental pain. No difficulty breathing or swallowing.  Exam consistent with enlarged lymph nodes, no fluctuance or erythema.  Patient was likely inflamed lymph node to her left submandibular area. There is no trismus. Floor of mouth is soft. No Ludwig angina. She is swallowing without difficulty.  Treat with antibiotics. Advise  recheck in 2 days with warm compresses. Return sooner if worsening.  I personally performed the services described in this documentation, which was scribed in my presence. The recorded information has been reviewed and is accurate.    Glynn Octave, MD 06/30/15 1719

## 2015-06-30 NOTE — ED Notes (Signed)
Pt reports her husband is coming to pick her up . Pain medication given.

## 2015-06-30 NOTE — Discharge Instructions (Signed)
Lymphadenopathy Take the antibiotics as prescribed. Follow up for a recheck in 2 days. Return sooner if you develop difficulty breathing, difficulty swallowing or any other concerns. Lymphadenopathy refers to swollen or enlarged lymph glands, also called lymph nodes. Lymph glands are part of your body's defense (immune) system, which protects the body from infections, germs, and diseases. Lymph glands are found in many locations in your body, including the neck, underarm, and groin.  Many things can cause lymph glands to become enlarged. When your immune system responds to germs, such as viruses or bacteria, infection-fighting cells and fluid build up. This causes the glands to grow in size. Usually, this is not something to worry about. The swelling and any soreness often go away without treatment. However, swollen lymph glands can also be caused by a number of diseases. Your health care provider may do various tests to help determine the cause. If the cause of your swollen lymph glands cannot be found, it is important to monitor your condition to make sure the swelling goes away. HOME CARE INSTRUCTIONS Watch your condition for any changes. The following actions may help to lessen any discomfort you are feeling:  Get plenty of rest.  Take medicines only as directed by your health care provider. Your health care provider may recommend over-the-counter medicines for pain.  Apply moist heat compresses to the site of swollen lymph nodes as directed by your health care provider. This can help reduce any pain.  Check your lymph nodes daily for any changes.  Keep all follow-up visits as directed by your health care provider. This is important. SEEK MEDICAL CARE IF:  Your lymph nodes are still swollen after 2 weeks.  Your swelling increases or spreads to other areas.  Your lymph nodes are hard, seem fixed to the skin, or are growing rapidly.  Your skin over the lymph nodes is red and  inflamed.  You have a fever.  You have chills.  You have fatigue.  You develop a sore throat.  You have abdominal pain.  You have weight loss.  You have night sweats. SEEK IMMEDIATE MEDICAL CARE IF:  You notice fluid leaking from the area of the enlarged lymph node.  You have severe pain in any area of your body.  You have chest pain.  You have shortness of breath.   This information is not intended to replace advice given to you by your health care provider. Make sure you discuss any questions you have with your health care provider.   Document Released: 03/10/2008 Document Revised: 06/22/2014 Document Reviewed: 01/04/2014 Elsevier Interactive Patient Education [employer] Elsevier Inc.

## 2015-06-30 NOTE — ED Notes (Signed)
Patient states that she woke up with sore throat and a swelling to her left lower jaw. Motrin at 6 this am

## 2015-07-02 LAB — CULTURE, GROUP A STREP (THRC)

## 2015-08-21 ENCOUNTER — Emergency Department (HOSPITAL_COMMUNITY)
Admission: EM | Admit: 2015-08-21 | Discharge: 2015-08-21 | Disposition: A | Discharge status: Home / Self Care | Payer: PRIVATE HEALTH INSURANCE | Attending: Emergency Medicine | Admitting: Emergency Medicine

## 2015-08-21 ENCOUNTER — Encounter (HOSPITAL_COMMUNITY): Payer: Self-pay

## 2015-08-21 DIAGNOSIS — Z792 Long term (current) use of antibiotics: Secondary | ICD-10-CM | POA: Insufficient documentation

## 2015-08-21 DIAGNOSIS — Z8742 Personal history of other diseases of the female genital tract: Secondary | ICD-10-CM | POA: Insufficient documentation

## 2015-08-21 DIAGNOSIS — F17200 Nicotine dependence, unspecified, uncomplicated: Secondary | ICD-10-CM | POA: Insufficient documentation

## 2015-08-21 DIAGNOSIS — T7840XA Allergy, unspecified, initial encounter: Secondary | ICD-10-CM | POA: Insufficient documentation

## 2015-08-21 DIAGNOSIS — F410 Panic disorder [episodic paroxysmal anxiety] without agoraphobia: Secondary | ICD-10-CM | POA: Insufficient documentation

## 2015-08-21 DIAGNOSIS — G8929 Other chronic pain: Secondary | ICD-10-CM | POA: Insufficient documentation

## 2015-08-21 DIAGNOSIS — F319 Bipolar disorder, unspecified: Secondary | ICD-10-CM | POA: Insufficient documentation

## 2015-08-21 MED ORDER — EPINEPHRINE 0.3 MG/0.3ML IJ SOAJ: 0.3000 mg | Freq: Once | INTRAMUSCULAR | Status: DC

## 2015-08-21 MED ORDER — ONDANSETRON HCL 4 MG/2ML IJ SOLN
4.0000 mg | Freq: Once | INTRAMUSCULAR | Status: AC
  Administered 2015-08-21: 4 mg via INTRAVENOUS
  Filled 2015-08-21: qty 2

## 2015-08-21 MED ORDER — DIPHENHYDRAMINE HCL 50 MG/ML IJ SOLN
50.0000 mg | Freq: Once | INTRAMUSCULAR | Status: AC
  Administered 2015-08-21: 50 mg via INTRAVENOUS
  Filled 2015-08-21: qty 1

## 2015-08-21 MED ORDER — SODIUM CHLORIDE 0.9 % IV BOLUS (SEPSIS)
1000.0000 mL | Freq: Once | INTRAVENOUS | Status: AC
  Administered 2015-08-21: 1000 mL via INTRAVENOUS

## 2015-08-21 MED ORDER — FAMOTIDINE IN NACL 20-0.9 MG/50ML-% IV SOLN
20.0000 mg | Freq: Once | INTRAVENOUS | Status: AC
  Administered 2015-08-21: 20 mg via INTRAVENOUS
  Filled 2015-08-21: qty 50

## 2015-08-21 MED ORDER — METHYLPREDNISOLONE SODIUM SUCC 125 MG IJ SOLR
125.0000 mg | Freq: Once | INTRAMUSCULAR | Status: AC
  Administered 2015-08-21: 125 mg via INTRAVENOUS
  Filled 2015-08-21: qty 2

## 2015-08-21 NOTE — ED Provider Notes (Signed)
CSN: [id number]     Arrival date & time 08/21/15  0759 History   First MD Initiated Contact with Patient 08/21/15 [phone]     Chief Complaint  Patient presents with  . Allergic Reaction     (Consider location/radiation/quality/duration/timing/severity/associated sxs/prior Treatment) HPI   35 year old female with rash. Onset about an hour prior to arrival. Her rash is red and itches. She has since developed some facial swelling and she feels like her throat is tight. She denies any wheezing. Denies any abdominal pains/cramps. Has had some mild nausea.  No vomiting or diarrhea.Recently started vraylar about a week ago.No new exposures that she is aware of otherwise. No intervention prior to arrival.  Past Medical History  Diagnosis Date  . Bipolar disorder (HCC)   . Panic attacks   . Ectopic pregnancy     June 2013  . Ovarian cyst   . Chronic back pain    Past Surgical History  Procedure Laterality Date  . Laparoscopy  12/29/2011    Procedure: LAPAROSCOPY OPERATIVE;  Surgeon: Tresa Endo A. Ernestina Penna, MD;  Location: [facility];  Service: Gynecology;  Laterality: Bilateral;  Right Salpingectomy   History reviewed. No pertinent family history. Social History  Substance Use Topics  . Smoking status: Current Every Day Smoker  . Smokeless tobacco: None  . Alcohol Use: No   OB History    Gravida Para Term Preterm AB TAB SAB Ectopic Multiple Living       Review of Systems  All systems reviewed and negative, other than as noted in HPI.   Allergies  Review of patient's allergies indicates no known allergies.  Home Medications   Prior to Admission medications   Medication Sig Start Date End Date Taking? Authorizing Provider  ALPRAZolam Prudy Feeler) 0.5 MG tablet Take 0.5 mg by mouth 4 (four) times daily as needed for anxiety.    Historical Provider, MD  amoxicillin-clavulanate (AUGMENTIN) 875-125 MG tablet Take 1 tablet by mouth every 12 (twelve) hours. 06/30/15   Glynn Octave, MD   BP 149/95 mmHg  Pulse 97  Temp(Src) 98.4 F (36.9 C) (Oral)  Resp 18  SpO2 100%  LMP 08/04/2015 Physical Exam  Constitutional: She appears well-developed and well-nourished. No distress.  HENT:  Head: Normocephalic and atraumatic.  Perhaps some mild facial swelling?Tongue appears normal in size. Normal sounding voice. Handling secretions.  Uvula is visible in its entirety.  Neck is supple. No stridor.  Eyes: Conjunctivae are normal. Right eye exhibits no discharge. Left eye exhibits no discharge.  Neck: Neck supple.  Cardiovascular: Normal rate, regular rhythm and normal heart sounds.  Exam reveals no gallop and no friction rub.   No murmur heard. Pulmonary/Chest: Effort normal and breath sounds normal. No respiratory distress.  Abdominal: Soft. She exhibits no distension. There is no tenderness.  Musculoskeletal: She exhibits no edema or tenderness.  Neurological: She is alert.  Skin: Skin is warm and dry. Rash noted.  Sparse urticarial appearing rash to neck and upper chest and upper extremities.  Psychiatric: She has a normal mood and affect. Her behavior is normal. Thought content normal.  Nursing note and vitals reviewed.   ED Course  Procedures (including critical care time) Labs Review Labs Reviewed - No data to display  Imaging Review No results found. I have personally reviewed and evaluated these images and lab results as part of my medical decision-making.   EKG Interpretation None      MDM  Final diagnoses:  Allergic reaction, initial encounter   34yF with allergic reaction. Urticarial rash, crampy abdominal pain, n/v, throat tightness. No wheezing on exam. o2 sats good. No hypotension. IV established. Antihistamines/steroids/IVF at this point. Close monitoring. Possible epi if symptoms worsen or don't start to improve. Will continue to observe.   Has been observed for almost 3 hours now with progressive improvement of symptoms. Remains HD  stable and no respiratory distress. I feel appropriate for DC at this time. DC with script for epi pens. Reviewed indications as well as other return precautions.   Raeford RazorStephen Ioana Louks, MD 08/25/15 2205

## 2015-08-21 NOTE — ED Notes (Addendum)
Pt started new medications x 1 week ago.  Unsure of spelling.  States it is for bipolar.  Pt started having allergic reaction x 1 hour ago.  Arm swelling, redness, itching.  Throat swelling.  Tongue feels like it is double in size.  Voice is hoarse.  Starting to have difficulty swallowing.  Did have vomiting and dizziness. MD alerted.

## 2015-08-21 NOTE — ED Notes (Signed)
Pt noted to walk to and from restroom w/o difficulty.

## 2015-08-21 NOTE — ED Notes (Signed)
Pt sts her throat is still "burning," but the swelling has not gotten any worse or better.  Kohut MD notified.

## 2015-08-21 NOTE — ED Notes (Signed)
Pt c/o throat and tongue swelling starting this morning.  Denies new foods, lotions, soaps, or creams.  Pt was started on a new medication last week.  + Dizziness +Dry cough

## 2015-09-20 ENCOUNTER — Emergency Department (HOSPITAL_COMMUNITY)
Admission: EM | Admit: 2015-09-20 | Discharge: 2015-09-20 | Disposition: A | Discharge status: Home / Self Care | Payer: PRIVATE HEALTH INSURANCE | Attending: Emergency Medicine | Admitting: Emergency Medicine

## 2015-09-20 DIAGNOSIS — K644 Residual hemorrhoidal skin tags: Secondary | ICD-10-CM | POA: Diagnosis not present

## 2015-09-20 DIAGNOSIS — Z8742 Personal history of other diseases of the female genital tract: Secondary | ICD-10-CM | POA: Diagnosis not present

## 2015-09-20 DIAGNOSIS — Z792 Long term (current) use of antibiotics: Secondary | ICD-10-CM | POA: Diagnosis not present

## 2015-09-20 DIAGNOSIS — M545 Low back pain: Secondary | ICD-10-CM | POA: Diagnosis not present

## 2015-09-20 DIAGNOSIS — F410 Panic disorder [episodic paroxysmal anxiety] without agoraphobia: Secondary | ICD-10-CM | POA: Insufficient documentation

## 2015-09-20 DIAGNOSIS — F319 Bipolar disorder, unspecified: Secondary | ICD-10-CM | POA: Insufficient documentation

## 2015-09-20 DIAGNOSIS — F17200 Nicotine dependence, unspecified, uncomplicated: Secondary | ICD-10-CM | POA: Insufficient documentation

## 2015-09-20 DIAGNOSIS — K625 Hemorrhage of anus and rectum: Secondary | ICD-10-CM | POA: Diagnosis present

## 2015-09-20 DIAGNOSIS — G8929 Other chronic pain: Secondary | ICD-10-CM | POA: Diagnosis not present

## 2015-09-20 MED ORDER — DOCUSATE SODIUM 100 MG PO CAPS: 100.0000 mg | ORAL_CAPSULE | Freq: Two times a day (BID) | ORAL | Status: DC

## 2015-09-20 NOTE — ED Notes (Signed)
Patient with hx of hemorrhoids x 6 months noted a large cluster of skin tags around entire anus, has had constant bright red rectal bleeding x 3 days. Has been using OTC suppositories and cooling gel x 1 week but pain has worsened. Pain in rectum and lower back.

## 2015-09-20 NOTE — ED Provider Notes (Signed)
CSN: [id number]     Arrival date & time 09/20/15  0932 History   First MD Initiated Contact with Patient 09/20/15 [phone]     Chief Complaint  Patient presents with  . Rectal Bleeding     (Consider location/radiation/quality/duration/timing/severity/associated sxs/prior Treatment) HPI Complains of painful bowel movements for the past 3 or 4 days with pain at anus and brown stool mixed with red blood. She denies lightheadedness denies abdominal pain admits to mild low back pain. No other associated symptoms pain is worse with having a bowel movement not improved with anything. Treated with over-the-counter cocoa butter hemorrhoidal suppositories and hemorrhoid cooling gel, without relief. Past Medical History  Diagnosis Date  . Bipolar disorder (HCC)   . Panic attacks   . Ectopic pregnancy     June 2013  . Ovarian cyst   . Chronic back pain    Past Surgical History  Procedure Laterality Date  . Laparoscopy  12/29/2011    Procedure: LAPAROSCOPY OPERATIVE;  Surgeon: Tresa Endo A. Ernestina Penna, MD;  Location: [facility];  Service: Gynecology;  Laterality: Bilateral;  Right Salpingectomy   No family history on file. Social History  Substance Use Topics  . Smoking status: Current Every Day Smoker  . Smokeless tobacco: Not on file  . Alcohol Use: No   OB History    Gravida Para Term Preterm AB TAB SAB Ectopic Multiple Living       Review of Systems  Constitutional: Negative.   HENT: Negative.   Respiratory: Negative.   Cardiovascular: Negative.   Gastrointestinal: Positive for blood in stool and anal bleeding.  Musculoskeletal: Positive for back pain.  Skin: Negative.   Neurological: Negative.   Psychiatric/Behavioral: Negative.   All other systems reviewed and are negative.     Allergies  Review of patient's allergies indicates no known allergies.  Home Medications   Prior to Admission medications   Medication Sig Start Date End Date Taking? Authorizing Provider   ALPRAZolam Prudy Feeler) 1 MG tablet Take 1 mg by mouth 3 (three) times daily as needed for anxiety.  08/11/15  Yes Historical Provider, MD  EPINEPHrine 0.3 mg/0.3 mL IJ SOAJ injection Inject 0.3 mLs (0.3 mg total) into the muscle once. 08/21/15  Yes Raeford Razor, MD  ibuprofen (ADVIL,MOTRIN) 200 MG tablet Take 600-800 mg by mouth every 6 (six) hours as needed for headache or moderate pain.   Yes Historical Provider, MD  OVER THE COUNTER MEDICATION Take 1 tablet by mouth daily as needed (allergies). Equate brand allergy med   Yes Historical Provider, MD  amoxicillin-clavulanate (AUGMENTIN) 875-125 MG tablet Take 1 tablet by mouth every 12 (twelve) hours. 06/30/15   Glynn Octave, MD   BP 137/97 mmHg  Pulse 115  Temp(Src) 97.6 F (36.4 C) (Oral)  SpO2 100%  LMP 08/04/2015 Physical Exam  Constitutional: She appears well-developed and well-nourished. No distress.  HENT:  Head: Normocephalic and atraumatic.  Eyes: Conjunctivae are normal. Pupils are equal, round, and reactive to light.  Neck: Neck supple. No tracheal deviation present. No thyromegaly present.  Cardiovascular: Normal rate and regular rhythm.   No murmur heard. Pulmonary/Chest: Effort normal and breath sounds normal.  Abdominal: Soft. Bowel sounds are normal. She exhibits no distension. There is no tenderness.  Obese  Genitourinary:  External hemorrhoids present, nonthrombosed. Hemorrhoid at 4:00 has a scabbed lesion overlying. With minimal tenderness.  Musculoskeletal: Normal range of motion. She exhibits no edema or tenderness.  Neurological: She is  alert. Coordination normal.  Skin: Skin is warm and dry. No rash noted.  Psychiatric: She has a normal mood and affect.  Nursing note and vitals reviewed.   ED Course  Procedures (including critical care time) Labs Review Labs Reviewed - No data to display  Imaging Review No results found. I have personally reviewed and evaluated these images and lab results as part of my  medical decision-making.   EKG Interpretation None      MDM  I explained to patient that opioid pain medicine will not benefit her prescription with cause constipation. Suggest sitz baths, ibuprofen. Continue suppositories and hemorrhoidal: Gel. Prescription Col[facility]. Referral [facility] Final diagnoses:  None   Diagnosis external hemorrhoids with hemorrhoidal bleeding     Doug SouSam Hiawatha Dressel, MD 09/20/15 1050

## 2015-09-20 NOTE — Discharge Instructions (Signed)
Hemorrhoids Sit in warm bathtub 4 times daily for 30 minutes at a time. Take the stool softener as prescribed. You can continue to use the suppositories and hemorrhoid cream as directed. Call [facility] [place] [facility] to schedule an office visit if not improving in a week. Return if concerned for any reason Hemorrhoids are swollen veins around the rectum or anus. There are two types of hemorrhoids:   Internal hemorrhoids. These occur in the veins just inside the rectum. They may poke through to the outside and become irritated and painful.  External hemorrhoids. These occur in the veins outside the anus and can be felt as a painful swelling or hard lump near the anus. CAUSES  Pregnancy.   Obesity.   Constipation or diarrhea.   Straining to have a bowel movement.   Sitting for long periods on the toilet.  Heavy lifting or other activity that caused you to strain.  Anal intercourse. SYMPTOMS   Pain.   Anal itching or irritation.   Rectal bleeding.   Fecal leakage.   Anal swelling.   One or more lumps around the anus.  DIAGNOSIS  Your caregiver may be able to diagnose hemorrhoids by visual examination. Other examinations or tests that may be performed include:   Examination of the rectal area with a gloved hand (digital rectal exam).   Examination of anal canal using a small tube (scope).   A blood test if you have lost a significant amount of blood.  A test to look inside the colon (sigmoidoscopy or colonoscopy). TREATMENT Most hemorrhoids can be treated at home. However, if symptoms do not seem to be getting better or if you have a lot of rectal bleeding, your caregiver may perform a procedure to help make the hemorrhoids get smaller or remove them completely. Possible treatments include:   Placing a rubber band at the base of the hemorrhoid to cut off the circulation (rubber band ligation).   Injecting a chemical to shrink the hemorrhoid (sclerotherapy).    Using a tool to burn the hemorrhoid (infrared light therapy).   Surgically removing the hemorrhoid (hemorrhoidectomy).   Stapling the hemorrhoid to block blood flow to the tissue (hemorrhoid stapling).  HOME CARE INSTRUCTIONS   Eat foods with fiber, such as whole grains, beans, nuts, fruits, and vegetables. Ask your doctor about taking products with added fiber in them (fibersupplements).  Increase fluid intake. Drink enough water and fluids to keep your urine clear or pale yellow.   Exercise regularly.   Go to the bathroom when you have the urge to have a bowel movement. Do not wait.   Avoid straining to have bowel movements.   Keep the anal area dry and clean. Use wet toilet paper or moist towelettes after a bowel movement.   Medicated creams and suppositories may be used or applied as directed.   Only take over-the-counter or prescription medicines as directed by your caregiver.   Take warm sitz baths for 15-20 minutes, 3-4 times a day to ease pain and discomfort.   Place ice packs on the hemorrhoids if they are tender and swollen. Using ice packs between sitz baths may be helpful.   Put ice in a plastic bag.   Place a towel between your skin and the bag.   Leave the ice on for 15-20 minutes, 3-4 times a day.   Do not use a donut-shaped pillow or sit on the toilet for long periods. This increases blood pooling and pain.  SEEK MEDICAL CARE IF:  You have increasing pain and swelling that is not controlled by treatment or medicine.  You have uncontrolled bleeding.  You have difficulty or you are unable to have a bowel movement.  You have pain or inflammation outside the area of the hemorrhoids. MAKE SURE YOU:  Understand these instructions.  Will watch your condition.  Will get help right away if you are not doing well or get worse.   This information is not intended to replace advice given to you by your health care provider. Make sure you  discuss any questions you have with your health care provider.   Document Released: 05/29/2000 Document Revised: 05/18/2012 Document Reviewed: 04/05/2012 Elsevier Interactive Patient Education [employer]

## 2015-11-12 ENCOUNTER — Emergency Department (HOSPITAL_COMMUNITY)
Admission: EM | Admit: 2015-11-12 | Discharge: 2015-11-12 | Disposition: A | Discharge status: Home / Self Care | Payer: PRIVATE HEALTH INSURANCE | Attending: Emergency Medicine | Admitting: Emergency Medicine

## 2015-11-12 ENCOUNTER — Encounter (HOSPITAL_COMMUNITY): Payer: Self-pay | Admitting: Emergency Medicine

## 2015-11-12 DIAGNOSIS — J0110 Acute frontal sinusitis, unspecified: Secondary | ICD-10-CM

## 2015-11-12 DIAGNOSIS — F17200 Nicotine dependence, unspecified, uncomplicated: Secondary | ICD-10-CM | POA: Insufficient documentation

## 2015-11-12 DIAGNOSIS — Z79899 Other long term (current) drug therapy: Secondary | ICD-10-CM | POA: Insufficient documentation

## 2015-11-12 DIAGNOSIS — J9801 Acute bronchospasm: Secondary | ICD-10-CM

## 2015-11-12 DIAGNOSIS — J069 Acute upper respiratory infection, unspecified: Secondary | ICD-10-CM | POA: Insufficient documentation

## 2015-11-12 MED ORDER — BENZONATATE 100 MG PO CAPS: 100.0000 mg | ORAL_CAPSULE | Freq: Three times a day (TID) | ORAL | Status: DC

## 2015-11-12 MED ORDER — ALBUTEROL SULFATE HFA 108 (90 BASE) MCG/ACT IN AERS
2.0000 | INHALATION_SPRAY | RESPIRATORY_TRACT | Status: DC | PRN
  Administered 2015-11-12: 2 via RESPIRATORY_TRACT
  Filled 2015-11-12: qty 6.7

## 2015-11-12 MED ORDER — ALBUTEROL SULFATE HFA 108 (90 BASE) MCG/ACT IN AERS: 1.0000 | INHALATION_SPRAY | Freq: Four times a day (QID) | RESPIRATORY_TRACT | Status: DC | PRN

## 2015-11-12 MED ORDER — PREDNISONE 20 MG PO TABS
60.0000 mg | ORAL_TABLET | Freq: Once | ORAL | Status: AC
  Administered 2015-11-12: 60 mg via ORAL
  Filled 2015-11-12: qty 3

## 2015-11-12 MED ORDER — PREDNISONE 20 MG PO TABS: 20.0000 mg | ORAL_TABLET | Freq: Two times a day (BID) | ORAL | Status: DC

## 2015-11-12 MED ORDER — AMOXICILLIN 500 MG PO CAPS: 500.0000 mg | ORAL_CAPSULE | Freq: Three times a day (TID) | ORAL | Status: DC

## 2015-11-12 NOTE — ED Notes (Signed)
Per pt, states cold symptoms for over 2 weeks-states not getting any better-sore throat, cough, and congestion

## 2015-11-12 NOTE — Discharge Instructions (Signed)
Bronchospasm, Adult A bronchospasm is a spasm or tightening of the airways going into the lungs. During a bronchospasm breathing becomes more difficult because the airways get smaller. When this happens there can be coughing, a whistling sound when breathing (wheezing), and difficulty breathing. Bronchospasm is often associated with asthma, but not all patients who experience a bronchospasm have asthma. CAUSES  A bronchospasm is caused by inflammation or irritation of the airways. The inflammation or irritation may be triggered by:   Allergies (such as to animals, pollen, food, or mold). Allergens that cause bronchospasm may cause wheezing immediately after exposure or many hours later.   Infection. Viral infections are believed to be the most common cause of bronchospasm.   Exercise.   Irritants (such as pollution, cigarette smoke, strong odors, aerosol sprays, and paint fumes).   Weather changes. Winds increase molds and pollens in the air. Rain refreshes the air by washing irritants out. Cold air may cause inflammation.   Stress and emotional upset.  SIGNS AND SYMPTOMS   Wheezing.   Excessive nighttime coughing.   Frequent or severe coughing with a simple cold.   Chest tightness.   Shortness of breath.  DIAGNOSIS  Bronchospasm is usually diagnosed through a history and physical exam. Tests, such as chest X-rays, are sometimes done to look for other conditions. TREATMENT   Inhaled medicines can be given to open up your airways and help you breathe. The medicines can be given using either an inhaler or a nebulizer machine.  Corticosteroid medicines may be given for severe bronchospasm, usually when it is associated with asthma. HOME CARE INSTRUCTIONS   Always have a plan prepared for seeking medical care. Know when to call your health care provider and local emergency services (911 in the [place] Know where you can access local emergency care.  Only take medicines as  directed by your health care provider.  If you were prescribed an inhaler or nebulizer machine, ask your health care provider to explain how to use it correctly. Always use a spacer with your inhaler if you were given one.  It is necessary to remain calm during an attack. Try to relax and breathe more slowly.  Control your home environment in the following ways:   Change your heating and air conditioning filter at least once a month.   Limit your use of fireplaces and wood stoves.  Do not smoke and do not allow smoking in your home.   Avoid exposure to perfumes and fragrances.   Get rid of pests (such as roaches and mice) and their droppings.   Throw away plants if you see mold on them.   Keep your house clean and dust free.   Replace carpet with wood, tile, or vinyl flooring. Carpet can trap dander and dust.   Use allergy-proof pillows, mattress covers, and box spring covers.   Wash bed sheets and blankets every week in hot water and dry them in a dryer.   Use blankets that are made of polyester or cotton.   Wash hands frequently. SEEK MEDICAL CARE IF:   You have muscle aches.   You have chest pain.   The sputum changes from clear or white to yellow, green, gray, or bloody.   The sputum you cough up gets thicker.   There are problems that may be related to the medicine you are given, such as a rash, itching, swelling, or trouble breathing.  SEEK IMMEDIATE MEDICAL CARE IF:   You have worsening wheezing and coughing  even after taking your prescribed medicines.   You have increased difficulty breathing.   You develop severe chest pain. MAKE SURE YOU:   Understand these instructions.  Will watch your condition.  Will get help right away if you are not doing well or get worse.   This information is not intended to replace advice given to you by your health care provider. Make sure you discuss any questions you have with your health care  provider.   Document Released: 06/04/2003 Document Revised: 06/22/2014 Document Reviewed: 11/21/2012 Elsevier Interactive Patient Education 2016 [employer]  Sinusitis, Adult Sinusitis is redness, soreness, and puffiness (inflammation) of the air pockets in the bones of your face (sinuses). The redness, soreness, and puffiness can cause air and mucus to get trapped in your sinuses. This can allow germs to grow and cause an infection.  HOME CARE   Drink enough fluids to keep your pee (urine) clear or pale yellow.  Use a humidifier in your home.  Run a hot shower to create steam in the bathroom. Sit in the bathroom with the door closed. Breathe in the steam 3-4 times a day.  Put a warm, moist washcloth on your face 3-4 times a day, or as told by your doctor.  Use salt water sprays (saline sprays) to wet the thick fluid in your nose. This can help the sinuses drain.  Only take medicine as told by your doctor. GET HELP RIGHT AWAY IF:   Your pain gets worse.  You have very bad headaches.  You are sick to your stomach (nauseous).  You throw up (vomit).  You are very sleepy (drowsy) all the time.  Your face is puffy (swollen).  Your vision changes.  You have a stiff neck.  You have trouble breathing. MAKE SURE YOU:   Understand these instructions.  Will watch your condition.  Will get help right away if you are not doing well or get worse.   This information is not intended to replace advice given to you by your health care provider. Make sure you discuss any questions you have with your health care provider.   Document Released: 11/18/2007 Document Revised: 06/22/2014 Document Reviewed: 01/05/2012 Elsevier Interactive Patient Education 2016 [employer]  Upper Respiratory Infection, Adult Most upper respiratory infections (URIs) are caused by a virus. A URI affects the nose, throat, and upper air passages. The most common type of URI is often called "the common  cold." HOME CARE   Take medicines only as told by your doctor.  Gargle warm saltwater or take cough drops to comfort your throat as told by your doctor.  Use a warm mist humidifier or inhale steam from a shower to increase air moisture. This may make it easier to breathe.  Drink enough fluid to keep your pee (urine) clear or pale yellow.  Eat soups and other clear broths.  Have a healthy diet.  Rest as needed.  Go back to work when your fever is gone or your doctor says it is okay.  You may need to stay home longer to avoid giving your URI to others.  You can also wear a face mask and wash your hands often to prevent spread of the virus.  Use your inhaler more if you have asthma.  Do not use any tobacco products, including cigarettes, chewing tobacco, or electronic cigarettes. If you need help quitting, ask your doctor. GET HELP IF:  You are getting worse, not better.  Your symptoms are not helped by  medicine.  You have chills.  You are getting more short of breath.  You have brown or red mucus.  You have yellow or brown discharge from your nose.  You have pain in your face, especially when you bend forward.  You have a fever.  You have puffy (swollen) neck glands.  You have pain while swallowing.  You have white areas in the back of your throat. GET HELP RIGHT AWAY IF:   You have very bad or constant:  Headache.  Ear pain.  Pain in your forehead, behind your eyes, and over your cheekbones (sinus pain).  Chest pain.  You have long-lasting (chronic) lung disease and any of the following:  Wheezing.  Long-lasting cough.  Coughing up blood.  A change in your usual mucus.  You have a stiff neck.  You have changes in your:  Vision.  Hearing.  Thinking.  Mood. MAKE SURE YOU:   Understand these instructions.  Will watch your condition.  Will get help right away if you are not doing well or get worse.   This information is not intended  to replace advice given to you by your health care provider. Make sure you discuss any questions you have with your health care provider.   Document Released: 11/18/2007 Document Revised: 10/16/2014 Document Reviewed: 09/06/2013 Elsevier Interactive Patient Education [employer] Inc[employer]

## 2015-11-12 NOTE — ED Provider Notes (Signed)
CSN: [id number]     Arrival date & time 11/12/15  [id number] History   First MD Initiated Contact with Patient 11/12/15 [phone]     Chief Complaint  Patient presents with  . URI      HPI  She presents for evaluation of a 14 day illness. Symptoms started with "cold". States that she had a runny nose and a bit of a sore throat was using over-the-counter medications. After approximately a week she fell she was improving. She 3-4 days improvement. About 5 days ago, she states she develop worsening sinus pressure. Has developed more of the purulent nasal discharge. Now has coughing and shortness of breath. She's not had fever shakes or chills. She has no chest pain. Her throat remains sore to cough, or swallow. She states she has "lymph nodes" they're painful and her anterior neck.  Past Medical History  Diagnosis Date  . Bipolar disorder (HCC)   . Panic attacks   . Ectopic pregnancy     June 2013  . Ovarian cyst   . Chronic back pain    Past Surgical History  Procedure Laterality Date  . Laparoscopy  12/29/2011    Procedure: LAPAROSCOPY OPERATIVE;  Surgeon: Tresa EndoKelly A. Ernestina PennaFogleman, MD;  Location: [facility];  Service: Gynecology;  Laterality: Bilateral;  Right Salpingectomy   History reviewed. No pertinent family history. Social History  Substance Use Topics  . Smoking status: Current Every Day Smoker  . Smokeless tobacco: None  . Alcohol Use: No   OB History    Gravida Para Term Preterm AB TAB SAB Ectopic Multiple Living   2 0 0 0 0 0 0 0 0 0      Review of Systems  Constitutional: Negative for fever, chills, diaphoresis, appetite change and fatigue.  HENT: Positive for congestion, ear pain, postnasal drip, rhinorrhea and sore throat. Negative for mouth sores, trouble swallowing and voice change.   Eyes: Negative for visual disturbance.  Respiratory: Positive for cough and shortness of breath. Negative for chest tightness and wheezing.   Cardiovascular: Negative for chest pain.    Gastrointestinal: Negative for nausea, vomiting, abdominal pain, diarrhea and abdominal distention.  Endocrine: Negative for polydipsia, polyphagia and polyuria.  Genitourinary: Negative for dysuria, frequency and hematuria.  Musculoskeletal: Negative for gait problem.  Skin: Negative for color change, pallor and rash.  Neurological: Negative for dizziness, syncope, light-headedness and headaches.  Hematological: Does not bruise/bleed easily.  Psychiatric/Behavioral: Negative for behavioral problems and confusion.      Allergies  Review of patient's allergies indicates no known allergies.  Home Medications   Prior to Admission medications   Medication Sig Start Date End Date Taking? Authorizing Provider  albuterol (PROVENTIL HFA;VENTOLIN HFA) 108 (90 Base) MCG/ACT inhaler Inhale 1-2 puffs into the lungs every 6 (six) hours as needed for wheezing. 11/12/15   Rolland PorterMark Aadan Chenier, MD  ALPRAZolam Prudy Feeler(XANAX) 1 MG tablet Take 1 mg by mouth 3 (three) times daily as needed for anxiety.  08/11/15   Historical Provider, MD  amoxicillin (AMOXIL) 500 MG capsule Take 1 capsule (500 mg total) by mouth 3 (three) times daily. 11/12/15   Rolland PorterMark Walta [place], MD  amoxicillin-clavulanate (AUGMENTIN) 875-125 MG tablet Take 1 tablet by mouth every 12 (twelve) hours. 06/30/15   Glynn OctaveStephen Rancour, MD  benzonatate (TESSALON) 100 MG capsule Take 1 capsule (100 mg total) by mouth every 8 (eight) hours. 11/12/15   Rolland PorterMark Sherree Shankman, MD  docusate sodium (COLACE) 100 MG capsule Take 1 capsule (100 mg total) by mouth every 12 (twelve)  hours. 09/20/15   Doug Sou, MD  EPINEPHrine 0.3 mg/0.3 mL IJ SOAJ injection Inject 0.3 mLs (0.3 mg total) into the muscle once. 08/21/15   Raeford Razor, MD  ibuprofen (ADVIL,MOTRIN) 200 MG tablet Take 600-800 mg by mouth every 6 (six) hours as needed for headache or moderate pain.    Historical Provider, MD  OVER THE COUNTER MEDICATION Take 1 tablet by mouth daily as needed (allergies). Equate brand allergy med     Historical Provider, MD  predniSONE (DELTASONE) 20 MG tablet Take 1 tablet (20 mg total) by mouth 2 (two) times daily with a meal. 11/12/15   Rolland Porter, MD   BP 127/106 mmHg  Pulse 110  Temp(Src) 97.9 F (36.6 C) (Oral)  Resp 18  SpO2 100%  LMP 10/15/2015 Physical Exam  Constitutional: She is oriented to person, place, and time. She appears well-developed and well-nourished. No distress.  HENT:  Head: Normocephalic.    Eyes: Conjunctivae are normal. Pupils are equal, round, and reactive to light. No scleral icterus.  Neck: Normal range of motion. Neck supple. No thyromegaly present.  Cardiovascular: Normal rate and regular rhythm.  Exam reveals no gallop and no friction rub.   No murmur heard. Pulmonary/Chest: Effort normal and breath sounds normal. No respiratory distress. She has no wheezes. She has no rales.  Mild bronchospasm with cough. No abnormalities or asymmetry with quiet respirations. No focal diminished breath sounds.  Abdominal: Soft. Bowel sounds are normal. She exhibits no distension. There is no tenderness. There is no rebound.  Musculoskeletal: Normal range of motion.  Neurological: She is alert and oriented to person, place, and time.  Skin: Skin is warm and dry. No rash noted.  Psychiatric: She has a normal mood and affect. Her behavior is normal.    ED Course  Procedures (including critical care time) Labs Review Labs Reviewed - No data to display  Imaging Review No results found. I have personally reviewed and evaluated these images and lab results as part of my medical decision-making.   EKG Interpretation None      MDM   Final diagnoses:  Acute frontal sinusitis, recurrence not specified  URI (upper respiratory infection)  Bronchospasm    Patient with wheezing. No other abnormalities to suggest pneumonia. Not febrile. Not hypoxemic. No increased work of breathing. At the bedside her resting heart rate is 89. Plan will be prednisone, cough  suppressant, albuterol. I discussed with her that her majority of her symptoms are likely viral. However she had a period of improvement followed by worsening and now marked sinus pressure. Will place her on a ten-day course of antibiotics for acute sinusitis. Follow up with her primary care physician not improving. ER with any acute changes.    Rolland Porter, MD 11/12/15 [phone]

## 2016-05-28 ENCOUNTER — Encounter (HOSPITAL_BASED_OUTPATIENT_CLINIC_OR_DEPARTMENT_OTHER): Payer: Self-pay | Admitting: *Deleted

## 2016-05-28 ENCOUNTER — Emergency Department (HOSPITAL_BASED_OUTPATIENT_CLINIC_OR_DEPARTMENT_OTHER)
Admission: EM | Admit: 2016-05-28 | Discharge: 2016-05-28 | Disposition: A | Discharge status: Home / Self Care | Payer: PRIVATE HEALTH INSURANCE | Attending: Emergency Medicine | Admitting: Emergency Medicine

## 2016-05-28 DIAGNOSIS — B9689 Other specified bacterial agents as the cause of diseases classified elsewhere: Secondary | ICD-10-CM

## 2016-05-28 DIAGNOSIS — F17210 Nicotine dependence, cigarettes, uncomplicated: Secondary | ICD-10-CM | POA: Insufficient documentation

## 2016-05-28 DIAGNOSIS — N730 Acute parametritis and pelvic cellulitis: Secondary | ICD-10-CM

## 2016-05-28 DIAGNOSIS — N739 Female pelvic inflammatory disease, unspecified: Secondary | ICD-10-CM | POA: Insufficient documentation

## 2016-05-28 DIAGNOSIS — Z79899 Other long term (current) drug therapy: Secondary | ICD-10-CM | POA: Insufficient documentation

## 2016-05-28 DIAGNOSIS — N760 Acute vaginitis: Secondary | ICD-10-CM | POA: Insufficient documentation

## 2016-05-28 LAB — COMPREHENSIVE METABOLIC PANEL
ALT: 9 U/L — ABNORMAL LOW (ref 14–54)
AST: 24 U/L (ref 15–41)
Albumin: 4.3 g/dL (ref 3.5–5.0)
Alkaline Phosphatase: 71 U/L (ref 38–126)
Anion gap: 10 (ref 5–15)
BUN: 12 mg/dL (ref 6–20)
CO2: 21 mmol/L — ABNORMAL LOW (ref 22–32)
Calcium: 9.3 mg/dL (ref 8.9–10.3)
Chloride: 105 mmol/L (ref 101–111)
Creatinine, Ser: 0.87 mg/dL (ref 0.44–1.00)
GFR calc Af Amer: >60 mL/min (ref >60)
GFR calc non Af Amer: >60 mL/min (ref >60)
Glucose, Bld: 108 mg/dL — ABNORMAL HIGH (ref 65–99)
Potassium: 3.9 mmol/L (ref 3.5–5.1)
Sodium: 136 mmol/L (ref 135–145)
Total Bilirubin: 0.3 mg/dL (ref 0.3–1.2)
Total Protein: 8.2 g/dL — ABNORMAL HIGH (ref 6.5–8.1)

## 2016-05-28 LAB — URINALYSIS, MICROSCOPIC (REFLEX)

## 2016-05-28 LAB — CBC WITH DIFFERENTIAL/PLATELET
Basophils Absolute: 0 10*3/uL (ref 0.0–0.1)
Basophils Relative: 0 %
Eosinophils Absolute: 0.3 10*3/uL (ref 0.0–0.7)
Eosinophils Relative: 3 %
HCT: 37.2 % (ref 36.0–46.0)
Hemoglobin: 10.9 g/dL — ABNORMAL LOW (ref 12.0–15.0)
Lymphocytes Relative: 18 %
Lymphs Abs: 2 10*3/uL (ref 0.7–4.0)
MCH: 20.5 pg — ABNORMAL LOW (ref 26.0–34.0)
MCHC: 29.3 g/dL — ABNORMAL LOW (ref 30.0–36.0)
MCV: 69.8 fL — ABNORMAL LOW (ref 78.0–100.0)
Monocytes Absolute: 0.9 10*3/uL (ref 0.1–1.0)
Monocytes Relative: 8 %
Neutro Abs: 7.7 10*3/uL (ref 1.7–7.7)
Neutrophils Relative %: 71 %
Platelets: 333 10*3/uL (ref 150–400)
RBC: 5.33 MIL/uL — ABNORMAL HIGH (ref 3.87–5.11)
RDW: 20 % — ABNORMAL HIGH (ref 11.5–15.5)
WBC: 10.9 10*3/uL — ABNORMAL HIGH (ref 4.0–10.5)

## 2016-05-28 LAB — URINALYSIS, ROUTINE W REFLEX MICROSCOPIC
Bilirubin Urine: NEGATIVE
Glucose, UA: NEGATIVE mg/dL
Hgb urine dipstick: NEGATIVE
Ketones, ur: NEGATIVE mg/dL
Nitrite: NEGATIVE
Protein, ur: NEGATIVE mg/dL
Specific Gravity, Urine: 1.012 (ref 1.005–1.030)
pH: 6 (ref 5.0–8.0)

## 2016-05-28 LAB — WET PREP, GENITAL
Sperm: NONE SEEN
Trich, Wet Prep: NONE SEEN
Yeast Wet Prep HPF POC: NONE SEEN

## 2016-05-28 LAB — LIPASE, BLOOD: Lipase: 42 U/L (ref 11–51)

## 2016-05-28 LAB — PREGNANCY, URINE: Preg Test, Ur: NEGATIVE

## 2016-05-28 MED ORDER — DIPHENHYDRAMINE HCL 50 MG/ML IJ SOLN
50.0000 mg | Freq: Once | INTRAMUSCULAR | Status: AC
  Administered 2016-05-28: 50 mg via INTRAVENOUS

## 2016-05-28 MED ORDER — SODIUM CHLORIDE 0.9 % IV BOLUS (SEPSIS)
1000.0000 mL | Freq: Once | INTRAVENOUS | Status: AC
  Administered 2016-05-28: 1000 mL via INTRAVENOUS

## 2016-05-28 MED ORDER — FAMOTIDINE IN NACL 20-0.9 MG/50ML-% IV SOLN
20.0000 mg | Freq: Once | INTRAVENOUS | Status: AC
  Administered 2016-05-28: 20 mg via INTRAVENOUS

## 2016-05-28 MED ORDER — IBUPROFEN 800 MG PO TABS
800.0000 mg | ORAL_TABLET | Freq: Once | ORAL | Status: AC
  Administered 2016-05-28: 800 mg via ORAL
  Filled 2016-05-28: qty 1

## 2016-05-28 MED ORDER — DEXTROSE 5 % IV SOLN
250.0000 mg | Freq: Once | INTRAVENOUS | Status: AC
  Administered 2016-05-28: 250 mg via INTRAVENOUS
  Filled 2016-05-28: qty 250

## 2016-05-28 MED ORDER — METHYLPREDNISOLONE SODIUM SUCC 125 MG IJ SOLR
125.0000 mg | Freq: Once | INTRAMUSCULAR | Status: AC
  Administered 2016-05-28: 125 mg via INTRAVENOUS

## 2016-05-28 MED ORDER — FAMOTIDINE IN NACL 20-0.9 MG/50ML-% IV SOLN
INTRAVENOUS | Status: AC
  Administered 2016-05-28: 20 mg via INTRAVENOUS
  Filled 2016-05-28: qty 50

## 2016-05-28 MED ORDER — AZITHROMYCIN 250 MG PO TABS
1000.0000 mg | ORAL_TABLET | Freq: Once | ORAL | Status: AC
  Administered 2016-05-28: 1000 mg via ORAL
  Filled 2016-05-28: qty 4

## 2016-05-28 MED ORDER — METRONIDAZOLE 500 MG PO TABS: 500.0000 mg | ORAL_TABLET | Freq: Two times a day (BID) | ORAL | 0 refills | Status: DC

## 2016-05-28 MED ORDER — DOXYCYCLINE HYCLATE 100 MG PO CAPS: 100.0000 mg | ORAL_CAPSULE | Freq: Two times a day (BID) | ORAL | 0 refills | Status: AC

## 2016-05-28 MED ORDER — ONDANSETRON HCL 4 MG/2ML IJ SOLN
4.0000 mg | Freq: Once | INTRAMUSCULAR | Status: AC
  Administered 2016-05-28: 4 mg via INTRAVENOUS
  Filled 2016-05-28: qty 2

## 2016-05-28 MED ORDER — METHYLPREDNISOLONE SODIUM SUCC 125 MG IJ SOLR
INTRAMUSCULAR | Status: AC
  Administered 2016-05-28: 125 mg via INTRAVENOUS
  Filled 2016-05-28: qty 2

## 2016-05-28 MED ORDER — DIPHENHYDRAMINE HCL 50 MG/ML IJ SOLN
INTRAMUSCULAR | Status: AC
  Administered 2016-05-28: 50 mg via INTRAVENOUS
  Filled 2016-05-28: qty 1

## 2016-05-28 MED ORDER — CEFTRIAXONE SODIUM 250 MG IJ SOLR
INTRAMUSCULAR | Status: AC
  Filled 2016-05-28: qty 250

## 2016-05-28 MED ORDER — ONDANSETRON 4 MG PO TBDP: ORAL_TABLET | ORAL | 0 refills | Status: DC

## 2016-05-28 MED ORDER — ACETAMINOPHEN 500 MG PO TABS
1000.0000 mg | ORAL_TABLET | Freq: Once | ORAL | Status: AC
  Administered 2016-05-28: 1000 mg via ORAL
  Filled 2016-05-28: qty 2

## 2016-05-28 NOTE — ED Provider Notes (Signed)
[facility] Provider Note   CSN: [id number] Arrival date & time: 05/28/16  1515     History   Chief Complaint Chief Complaint  Patient presents with  . Emesis    HPI Evelyn Lopez is a 35 y.o. female.  35 yo F with a chief complaints of cough and congestion. The started about a week ago. Now having vomiting and diarrhea. She has been exposed to a relative who is younger than multiple recent illnesses. She denies fevers or chills currently. Having some lower abdominal pain comes and goes. Denies vaginal discharge or vaginal bleeding.   The history is provided by the patient.  Emesis   This is a new problem. The current episode started 6 to 12 hours ago. The problem occurs more than 10 times per day. The problem has not changed since onset.The emesis has an appearance of stomach contents. There has been no fever. Associated symptoms include abdominal pain. Pertinent negatives include no arthralgias, no chills, no fever, no headaches and no myalgias.    Past Medical History:  Diagnosis Date  . Bipolar disorder (HCC)   . Chronic back pain   . Ectopic pregnancy    June 2013  . Ovarian cyst   . Panic attacks     There are no active problems to display for this patient.   Past Surgical History:  Procedure Laterality Date  . LAPAROSCOPY  12/29/2011   Procedure: LAPAROSCOPY OPERATIVE;  Surgeon: Tresa Endo A. Ernestina Penna, MD;  Location: [facility];  Service: Gynecology;  Laterality: Bilateral;  Right Salpingectomy    OB History    Gravida Para Term Preterm AB Living   2 0 0 0 0 0   SAB TAB Ectopic Multiple Live Births   0 0 0 0         Home Medications    Prior to Admission medications   Medication Sig Start Date End Date Taking? Authorizing Provider  ALPRAZolam Prudy Feeler) 1 MG tablet Take 1 mg by mouth 3 (three) times daily as needed for anxiety.  08/11/15  Yes Historical Provider, MD  EPINEPHrine 0.3 mg/0.3 mL IJ SOAJ injection Inject 0.3 mLs (0.3 mg total) into the  muscle once. 08/21/15  Yes Raeford Razor, MD  ibuprofen (ADVIL,MOTRIN) 200 MG tablet Take 600-800 mg by mouth every 6 (six) hours as needed for headache or moderate pain.   Yes Historical Provider, MD  albuterol (PROVENTIL HFA;VENTOLIN HFA) 108 (90 Base) MCG/ACT inhaler Inhale 1-2 puffs into the lungs every 6 (six) hours as needed for wheezing. 11/12/15   Rolland Porter, MD  doxycycline (VIBRAMYCIN) 100 MG capsule Take 1 capsule (100 mg total) by mouth 2 (two) times daily. One po bid x 7 days 05/28/16 06/11/16  Melene Plan, DO  metroNIDAZOLE (FLAGYL) 500 MG tablet Take 1 tablet (500 mg total) by mouth 2 (two) times daily. One po bid x 7 days 05/28/16   Melene Plan, DO  ondansetron (ZOFRAN ODT) 4 MG disintegrating tablet 4mg  ODT q4 hours prn nausea/vomit 05/28/16   Melene Plan, DO    Family History No family history on file.  Social History Social History  Substance Use Topics  . Smoking status: Current Every Day Smoker    Packs/day: 1.00    Types: Cigarettes  . Smokeless tobacco: Never Used  . Alcohol use No     Allergies   Rocephin [ceftriaxone sodium in dextrose]   Review of Systems Review of Systems  Constitutional: Negative for chills and fever.  HENT: Positive for  congestion. Negative for rhinorrhea.   Eyes: Negative for redness and visual disturbance.  Respiratory: Positive for choking. Negative for shortness of breath and wheezing.   Cardiovascular: Negative for chest pain and palpitations.  Gastrointestinal: Positive for abdominal pain, nausea and vomiting.  Genitourinary: Negative for dysuria and urgency.  Musculoskeletal: Negative for arthralgias and myalgias.  Skin: Negative for pallor and wound.  Neurological: Negative for dizziness and headaches.     Physical Exam Updated Vital Signs BP 112/77   Pulse 83   Temp 98.5 F (36.9 C) (Oral)   Resp 20   Ht 5\' 5"  (1.651 m)   Wt 215 lb (97.5 kg)   LMP 05/09/2016   SpO2 100%   BMI 35.78 kg/m   Physical Exam    Constitutional: She is oriented to person, place, and time. She appears well-developed and well-nourished. No distress.  HENT:  Head: Normocephalic and atraumatic.  Swollen turbinates, posterior nasal drip, no noted sinus ttp, tm normal bilaterally.    Eyes: EOM are normal. Pupils are equal, round, and reactive to light.  Neck: Normal range of motion. Neck supple.  Cardiovascular: Normal rate and regular rhythm.  Exam reveals no gallop and no friction rub.   No murmur heard. Pulmonary/Chest: Effort normal. She has no wheezes. She has no rales.  Abdominal: Soft. She exhibits no distension and no mass. There is no tenderness. There is no guarding.    Genitourinary:  Genitourinary Comments: Marked purulent cervical discharge. Positive CMT. No adnexal tenderness or masses.  Musculoskeletal: She exhibits no edema or tenderness.  Neurological: She is alert and oriented to person, place, and time.  Skin: Skin is warm and dry. She is not diaphoretic.  Psychiatric: She has a normal mood and affect. Her behavior is normal.  Nursing note and vitals reviewed.    ED Treatments / Results  Labs (all labs ordered are listed, but only abnormal results are displayed) Labs Reviewed  WET PREP, GENITAL - Abnormal; Notable for the following:       Result Value   Clue Cells Wet Prep HPF POC PRESENT (*)    WBC, Wet Prep HPF POC FEW (*)    All other components within normal limits  CBC WITH DIFFERENTIAL/PLATELET - Abnormal; Notable for the following:    WBC 10.9 (*)    RBC 5.33 (*)    Hemoglobin 10.9 (*)    MCV 69.8 (*)    MCH 20.5 (*)    MCHC 29.3 (*)    RDW 20.0 (*)    All other components within normal limits  COMPREHENSIVE METABOLIC PANEL - Abnormal; Notable for the following:    CO2 21 (*)    Glucose, Bld 108 (*)    Total Protein 8.2 (*)    ALT 9 (*)    All other components within normal limits  URINALYSIS, ROUTINE W REFLEX MICROSCOPIC - Abnormal; Notable for the following:     APPearance CLOUDY (*)    Leukocytes, UA SMALL (*)    All other components within normal limits  URINALYSIS, MICROSCOPIC (REFLEX) - Abnormal; Notable for the following:    Bacteria, UA FEW (*)    Squamous Epithelial / LPF 6-30 (*)    All other components within normal limits  LIPASE, BLOOD  PREGNANCY, URINE  RPR  HIV ANTIBODY (ROUTINE TESTING)  GC/CHLAMYDIA PROBE AMP ([place]) NOT [facility]    EKG  EKG Interpretation None       Radiology No results found.  Procedures Procedures (including critical care  time)  Medications Ordered in ED Medications  cefTRIAXone (ROCEPHIN) 250 MG injection (  Not Given 05/28/16 1854)  sodium chloride 0.9 % bolus 1,000 mL (0 mLs Intravenous Stopped 05/28/16 1726)  ondansetron (ZOFRAN) injection 4 mg (4 mg Intravenous Given 05/28/16 1547)  acetaminophen (TYLENOL) tablet 1,000 mg (1,000 mg Oral Given 05/28/16 1652)  ibuprofen (ADVIL,MOTRIN) tablet 800 mg (800 mg Oral Given 05/28/16 1652)  cefTRIAXone (ROCEPHIN) 250 mg in dextrose 5 % 50 mL IVPB (0 mg Intravenous Stopped 05/28/16 1745)  azithromycin (ZITHROMAX) tablet 1,000 mg (1,000 mg Oral Given 05/28/16 1722)  diphenhydrAMINE (BENADRYL) injection 50 mg (50 mg Intravenous Given 05/28/16 1741)  methylPREDNISolone sodium succinate (SOLU-MEDROL) 125 mg/2 mL injection 125 mg (125 mg Intravenous Given 05/28/16 1744)  famotidine (PEPCID) IVPB 20 mg premix (0 mg Intravenous Stopped 05/28/16 1854)     Initial Impression / Assessment and Plan / ED Course  I have reviewed the triage vital signs and the nursing notes.  Pertinent labs & imaging results that were available during my care of the patient were reviewed by me and considered in my medical decision making (see chart for details).  Clinical Course     28 yoF With a chief complaints of cough congestion nausea vomiting and diarrhea. These appear to be 2 separate illnesses. She did mention that she had some hemoptysis but was scant happened 2  days ago and resolved. She also stated that she had some bright red stuff in her stool. This also resolved a couple days ago. Patient has significant findings concerning for PID. Will start on antibiotics. PCP follow-up.  After the patient received her initial antibiotic therapy she had a period where she hyperventilated and felt that she was having chest pain. She was given medications in case this was an allergic reaction. Her symptoms have resolved. She is requesting discharge home. I do not feel that this was an allergic reaction and more of a anxiety. Patient was somewhat distressed to be told that she may have a sexual transmitted disease.  7:10 PM:  I have discussed the diagnosis/risks/treatment options with the patient and believe the pt to be eligible for discharge home to follow-up with PCP. We also discussed returning to the ED immediately if new or worsening sx occur. We discussed the sx which are most concerning (e.g., sudden worsening pain, fever, inability to tolerate by mouth) that necessitate immediate return. Medications administered to the patient during their visit and any new prescriptions provided to the patient are listed below.  Medications given during this visit Medications  cefTRIAXone (ROCEPHIN) 250 MG injection (  Not Given 05/28/16 1854)  sodium chloride 0.9 % bolus 1,000 mL (0 mLs Intravenous Stopped 05/28/16 1726)  ondansetron (ZOFRAN) injection 4 mg (4 mg Intravenous Given 05/28/16 1547)  acetaminophen (TYLENOL) tablet 1,000 mg (1,000 mg Oral Given 05/28/16 1652)  ibuprofen (ADVIL,MOTRIN) tablet 800 mg (800 mg Oral Given 05/28/16 1652)  cefTRIAXone (ROCEPHIN) 250 mg in dextrose 5 % 50 mL IVPB (0 mg Intravenous Stopped 05/28/16 1745)  azithromycin (ZITHROMAX) tablet 1,000 mg (1,000 mg Oral Given 05/28/16 1722)  diphenhydrAMINE (BENADRYL) injection 50 mg (50 mg Intravenous Given 05/28/16 1741)  methylPREDNISolone sodium succinate (SOLU-MEDROL) 125 mg/2 mL injection 125  mg (125 mg Intravenous Given 05/28/16 1744)  famotidine (PEPCID) IVPB 20 mg premix (0 mg Intravenous Stopped 05/28/16 1854)     The patient appears reasonably screen and/or stabilized for discharge and I doubt any other medical condition or other [facility] requiring further screening, evaluation, or  treatment in the ED at this time prior to discharge.    Final Clinical Impressions(s) / ED Diagnoses   Final diagnoses:  BV (bacterial vaginosis)  PID (acute pelvic inflammatory disease)    New Prescriptions New Prescriptions   DOXYCYCLINE (VIBRAMYCIN) 100 MG CAPSULE    Take 1 capsule (100 mg total) by mouth 2 (two) times daily. One po bid x 7 days   METRONIDAZOLE (FLAGYL) 500 MG TABLET    Take 1 tablet (500 mg total) by mouth 2 (two) times daily. One po bid x 7 days   ONDANSETRON (ZOFRAN ODT) 4 MG DISINTEGRATING TABLET    4mg  ODT q4 hours prn nausea/vomit     Melene Planan Kristiane Morsch, DO 05/28/16 1910

## 2016-05-28 NOTE — ED Notes (Signed)
1745 - 1800 Called to room and patient breathing 55 times a minute. Patient is very anxious  - reports that she is not allergic to anything. The patient reports that she is SOB with chest pain and throat is scratchy. ED NP to the bedside with Charge nurse. Patient has dry scratchy throat. Reports that it started with the Rocephin. The rocephin started and new IV primary reprimed. ED Np gave orders for the reaction. This RN at the bedside while awaiting medications and patient decreasing RR to 24times per minute and calmer while awaiting medications. Patient without any wheezing or stridor noted upon ED NP listening to lung sounds. Emotional support given and patient starting to relax. Meds given as ordered and patient placed on full cardiac monitor to monitor any further reaction. Allergic reaxtion noted in the chart and placed on the allergy list for the patient. At bedside 1 on 1 x 15 minutes, the patient calm and cooperative when leaving room, patients mother at the bedside.

## 2016-05-28 NOTE — ED Triage Notes (Signed)
C/o head congestion x 2 weeks. C/o low back pain. N/v/d C/o coughing up blood and has seen blood in stool.

## 2016-05-29 LAB — HIV ANTIBODY (ROUTINE TESTING W REFLEX): HIV Screen 4th Generation wRfx: NONREACTIVE

## 2016-05-29 LAB — RPR: RPR Ser Ql: NONREACTIVE

## 2016-05-29 LAB — GC/CHLAMYDIA PROBE AMP (~~LOC~~) NOT AT ARMC
Chlamydia: NEGATIVE
Neisseria Gonorrhea: NEGATIVE

## 2016-07-16 ENCOUNTER — Emergency Department (HOSPITAL_BASED_OUTPATIENT_CLINIC_OR_DEPARTMENT_OTHER)
Admission: EM | Admit: 2016-07-16 | Discharge: 2016-07-16 | Disposition: A | Discharge status: Home / Self Care | Payer: Self-pay | Attending: Emergency Medicine | Admitting: Emergency Medicine

## 2016-07-16 ENCOUNTER — Emergency Department (HOSPITAL_BASED_OUTPATIENT_CLINIC_OR_DEPARTMENT_OTHER): Payer: Self-pay

## 2016-07-16 ENCOUNTER — Encounter (HOSPITAL_BASED_OUTPATIENT_CLINIC_OR_DEPARTMENT_OTHER): Payer: Self-pay | Admitting: *Deleted

## 2016-07-16 DIAGNOSIS — S20229A Contusion of unspecified back wall of thorax, initial encounter: Secondary | ICD-10-CM | POA: Insufficient documentation

## 2016-07-16 DIAGNOSIS — Y929 Unspecified place or not applicable: Secondary | ICD-10-CM | POA: Insufficient documentation

## 2016-07-16 DIAGNOSIS — Z79899 Other long term (current) drug therapy: Secondary | ICD-10-CM | POA: Insufficient documentation

## 2016-07-16 DIAGNOSIS — Y939 Activity, unspecified: Secondary | ICD-10-CM | POA: Insufficient documentation

## 2016-07-16 DIAGNOSIS — W010XXA Fall on same level from slipping, tripping and stumbling without subsequent striking against object, initial encounter: Secondary | ICD-10-CM | POA: Insufficient documentation

## 2016-07-16 DIAGNOSIS — Y999 Unspecified external cause status: Secondary | ICD-10-CM | POA: Insufficient documentation

## 2016-07-16 DIAGNOSIS — F17210 Nicotine dependence, cigarettes, uncomplicated: Secondary | ICD-10-CM | POA: Insufficient documentation

## 2016-07-16 LAB — PREGNANCY, URINE: Preg Test, Ur: NEGATIVE

## 2016-07-16 MED ORDER — METHOCARBAMOL 500 MG PO TABS: 500.0000 mg | ORAL_TABLET | Freq: Two times a day (BID) | ORAL | 0 refills | Status: DC

## 2016-07-16 MED ORDER — IBUPROFEN 800 MG PO TABS: 800.0000 mg | ORAL_TABLET | Freq: Three times a day (TID) | ORAL | 0 refills | Status: DC

## 2016-07-16 MED ORDER — HYDROCODONE-ACETAMINOPHEN 5-325 MG PO TABS: 1.0000 | ORAL_TABLET | ORAL | 0 refills | Status: DC | PRN

## 2016-07-16 MED ORDER — IBUPROFEN 800 MG PO TABS
800.0000 mg | ORAL_TABLET | Freq: Once | ORAL | Status: AC
  Administered 2016-07-16: 800 mg via ORAL
  Filled 2016-07-16: qty 1

## 2016-07-16 NOTE — ED Triage Notes (Signed)
Pt c/o fall x 1 day ago with mid upper back pain

## 2016-07-16 NOTE — ED Provider Notes (Signed)
[facility]   CSN: [id number] Arrival date & time: 07/16/16  1449  By signing my name below, I, [name], attest that this documentation has been prepared under the direction and in the presence of [name] M. Rayjon Wery, PA-C. Electronically Signed: [name], Scribe. 07/16/2016. 5:10 PM.   History   Chief Complaint Chief Complaint  Patient presents with  . Back Pain    The history is provided by the patient. No language interpreter was used.    HPI Comments:  Evelyn Lopez is a 36 y.o. female who presents to the Emergency Department complaining of constant thoracic back pain s/p a fall onset yesterday. She reports that yesterday her dog snuck up under her and she tripped and fell backwards from gor[place] hitting the concrete. She denies LOC and head injury. She has associated rib tenderness and pain to the area that worsens upon direct palpation. She notes ambulation and deep inspirations exacerbates the pain. Pt states taking Aleve and Ibuprofen at home for her symptoms with minimal relief. She denies numbness, bladder/bowel incontinence, fever, abdominal pain, nausea, vomiting, CP, neck pain, and any other associated injuries at this time.   Past Medical History:  Diagnosis Date  . Bipolar disorder (HCC)   . Chronic back pain   . Ectopic pregnancy    June 2013  . Ovarian cyst   . Panic attacks     There are no active problems to display for this patient.   Past Surgical History:  Procedure Laterality Date  . LAPAROSCOPY  12/29/2011   Procedure: LAPAROSCOPY OPERATIVE;  Surgeon: Tresa Endo A. Ernestina Penna, MD;  Location: [facility];  Service: Gynecology;  Laterality: Bilateral;  Right Salpingectomy    OB History    Gravida Para Term Preterm AB Living   2 0 0 0 0 0   SAB TAB Ectopic Multiple Live Births   0 0 0 0         Home Medications    Prior to Admission medications   Medication Sig Start Date End Date Taking? Authorizing Provider  albuterol  (PROVENTIL HFA;VENTOLIN HFA) 108 (90 Base) MCG/ACT inhaler Inhale 1-2 puffs into the lungs every 6 (six) hours as needed for wheezing. 11/12/15   Rolland Porter, MD  ALPRAZolam Prudy Feeler) 1 MG tablet Take 1 mg by mouth 3 (three) times daily as needed for anxiety.  08/11/15   Historical Provider, MD  EPINEPHrine 0.3 mg/0.3 mL IJ SOAJ injection Inject 0.3 mLs (0.3 mg total) into the muscle once. 08/21/15   Raeford Razor, MD  HYDROcodone-acetaminophen (NORCO/VICODIN) 5-325 MG tablet Take 1-2 tablets by mouth every 4 (four) hours as needed. 07/16/16   Emi Holes, PA-C  ibuprofen (ADVIL,MOTRIN) 800 MG tablet Take 1 tablet (800 mg total) by mouth 3 (three) times daily. 07/16/16   Emi Holes, PA-C  methocarbamol (ROBAXIN) 500 MG tablet Take 1 tablet (500 mg total) by mouth 2 (two) times daily. 07/16/16   Emi Holes, PA-C  ondansetron (ZOFRAN ODT) 4 MG disintegrating tablet 4mg  ODT q4 hours prn nausea/vomit 05/28/16   Melene Plan, DO    Family History History reviewed. No pertinent family history.  Social History Social History  Substance Use Topics  . Smoking status: Current Every Day Smoker    Packs/day: 0.50    Types: Cigarettes  . Smokeless tobacco: Never Used  . Alcohol use No     Allergies   Rocephin [ceftriaxone sodium in dextrose]   Review of Systems Review of Systems  Constitutional: Negative for chills and fever.  HENT: Negative for facial swelling and sore throat.   Respiratory: Negative for shortness of breath.   Cardiovascular: Negative for chest pain.  Gastrointestinal: Negative for abdominal pain, nausea and vomiting.  Genitourinary: Negative for dysuria.       - Bladder/Bowel incontinence   Musculoskeletal: Positive for back pain and myalgias. Negative for neck pain.  Skin: Negative for rash and wound.  Neurological: Negative for numbness and headaches.  Psychiatric/Behavioral: The patient is not nervous/anxious.      Physical Exam Updated Vital Signs BP 134/87 (BP  Location: Right Arm)   Pulse 71   Temp 97.7 F (36.5 C) (Oral)   Resp 16   Ht 5\' 9"  (1.753 m)   Wt 117.9 kg   LMP 06/17/2016   SpO2 100%   BMI 38.40 kg/m   Physical Exam  Constitutional: She appears well-developed and well-nourished. No distress.  HENT:  Head: Normocephalic and atraumatic.  Mouth/Throat: Oropharynx is clear and moist. No oropharyngeal exudate.  Eyes: Conjunctivae are normal. Pupils are equal, round, and reactive to light. Right eye exhibits no discharge. Left eye exhibits no discharge. No scleral icterus.  Neck: Normal range of motion. Neck supple. No thyromegaly present.  Cardiovascular: Normal rate, regular rhythm, normal heart sounds and intact distal pulses.  Exam reveals no gallop and no friction rub.   No murmur heard. Pulmonary/Chest: Effort normal and breath sounds normal. No stridor. No respiratory distress. She has no wheezes. She has no rales. She exhibits no tenderness.  Abdominal: Soft. Bowel sounds are normal. She exhibits no distension. There is no tenderness. There is no rebound and no guarding.  Musculoskeletal: She exhibits no edema.  Midline thoracic and lumbar tenderness. Bilateral posterior rib tenderness.   Lymphadenopathy:    She has no cervical adenopathy.  Neurological: She is alert. Coordination normal.  5/5 strength and normal sensation to lower extremities.  Skin: Skin is warm and dry. No rash noted. She is not diaphoretic. No pallor.  Psychiatric: She has a normal mood and affect.  Nursing note and vitals reviewed.    ED Treatments / Results  DIAGNOSTIC STUDIES:  Oxygen Saturation is 100% on RA, normal by my interpretation.    COORDINATION OF CARE:  4:28 PM Discussed treatment plan with pt at bedside including a chest and back XR and pt agreed to plan.  Labs (all labs ordered are listed, but only abnormal results are displayed) Labs Reviewed  PREGNANCY, URINE    EKG  EKG Interpretation None       Radiology Dg  Chest 2 View  Result Date: 07/16/2016 CLINICAL DATA:  Status post fall on concrete, landing on the back. Generalized chest discomfort, radiating to the upper back. Initial encounter. EXAM: CHEST  2 VIEW COMPARISON:  Chest radiograph performed 06/05/2015 FINDINGS: The lungs are well-aerated and clear. There is no evidence of focal opacification, pleural effusion or pneumothorax. The heart is normal in size; the mediastinal contour is within normal limits. No acute osseous abnormalities are seen. IMPRESSION: No acute cardiopulmonary process seen. No displaced rib fractures identified. Electronically Signed   By: Roanna RaiderJeffery  Chang M.D.   On: 07/16/2016 17:44   Dg Thoracic Spine 2 View  Result Date: 07/16/2016 CLINICAL DATA:  Status post fall onto concrete, landing on back. Upper back pain. Initial encounter. EXAM: THORACIC SPINE 2 VIEWS COMPARISON:  Chest radiograph performed 06/05/2015 FINDINGS: There is no evidence of fracture or subluxation. Vertebral bodies demonstrate normal height and alignment. Intervertebral disc  spaces are preserved. The visualized portions of both lungs are clear. The mediastinum is unremarkable in appearance. IMPRESSION: No evidence of fracture or subluxation along the cervical or thoracic spine. Electronically Signed   By: Roanna Raider M.D.   On: 07/16/2016 17:44   Dg Lumbar Spine Complete  Result Date: 07/16/2016 CLINICAL DATA:  Status post fall with low back pain. EXAM: LUMBAR SPINE - COMPLETE 4+ VIEW COMPARISON:  None. FINDINGS: Six non rib-bearing vertebral bodies. There is no evidence of lumbar spine fracture. Alignment is normal. Minimal degenerative disc disease in the lower lumbosacral spine. IMPRESSION: No evidence of acute traumatic injury to the lumbosacral spine. Minimal degenerative disc disease in the lower lumbosacral spine. Electronically Signed   By: Ted Mcalpine M.D.   On: 07/16/2016 17:45    Procedures Procedures (including critical care  time)  Medications Ordered in ED Medications  ibuprofen (ADVIL,MOTRIN) tablet 800 mg (800 mg Oral Given 07/16/16 1851)     Initial Impression / Assessment and Plan / ED Course  I have reviewed the triage vital signs and the nursing notes.  Pertinent labs & imaging results that were available during my care of the patient were reviewed by me and considered in my medical decision making (see chart for details).     Patient with back pain, most likely contusion. Lumbar, thoracic, and chest x-rays show no acute traumatic injury.  No neurological deficits and normal neuro exam.  Patient is ambulatory.  No loss of bowel or bladder control.  No concern for cauda equina.  No fever, night sweats, weight loss, h/o cancer, IVDA, no recent procedure to back. No urinary symptoms suggestive of UTI.  Discharge patient home with ibuprofen, Norco, Robaxin. I reviewed narcotic database and found no discrepancies. Supportive care and return precaution discussed. Follow-up to PCP if symptoms are not improving. Patient understands and agrees with plan. Patient vitals stable throughout ED course and discharged in satisfactory condition.   Final Clinical Impressions(s) / ED Diagnoses   Final diagnoses:  Contusion of back, unspecified laterality, initial encounter    New Prescriptions Discharge Medication List as of 07/16/2016  6:48 PM    START taking these medications   Details  HYDROcodone-acetaminophen (NORCO/VICODIN) 5-325 MG tablet Take 1-2 tablets by mouth every 4 (four) hours as needed., Starting Thu 07/16/2016, Print    methocarbamol (ROBAXIN) 500 MG tablet Take 1 tablet (500 mg total) by mouth 2 (two) times daily., Starting Thu 07/16/2016, Print       I personally performed the services described in this documentation, which was scribed in my presence. The recorded information has been reviewed and is accurate.     Emi Holes, PA-C 07/17/16 1551    Rolan Bucco, MD 07/17/16 Nicholos Johns

## 2016-07-16 NOTE — Discharge Instructions (Signed)
Medications: Ibuprofen, Norco, Robaxin  Treatment: Take ibuprofen every 8 hours as needed for your pain. You can alternate with 1-2 Norco every 4-6 hours in between ibuprofen for severe pain. For mild to moderate pain, you can use Tylenol instead, however do not take Tylenol within 4 hours of taking Norco. Take Robaxin twice daily as needed for muscle pain and spasms. Do not drive or operate machinery when taking Robaxin or Norco. Use ice 3-4 times daily alternating 20 minutes on, 20 minutes off. Attempt the back stretches and exercises as tolerated.  Follow-up: Please follow-up with your primary care provider if your symptoms are not improving over the next week. Please return to the emergency department if you develop any new or worsening symptoms.

## 2016-08-07 ENCOUNTER — Encounter (HOSPITAL_BASED_OUTPATIENT_CLINIC_OR_DEPARTMENT_OTHER): Payer: Self-pay | Admitting: *Deleted

## 2016-08-07 ENCOUNTER — Emergency Department (HOSPITAL_BASED_OUTPATIENT_CLINIC_OR_DEPARTMENT_OTHER)
Admission: EM | Admit: 2016-08-07 | Discharge: 2016-08-07 | Disposition: A | Discharge status: Home / Self Care | Payer: PRIVATE HEALTH INSURANCE | Attending: Emergency Medicine | Admitting: Emergency Medicine

## 2016-08-07 ENCOUNTER — Emergency Department (HOSPITAL_BASED_OUTPATIENT_CLINIC_OR_DEPARTMENT_OTHER): Payer: PRIVATE HEALTH INSURANCE

## 2016-08-07 DIAGNOSIS — F17210 Nicotine dependence, cigarettes, uncomplicated: Secondary | ICD-10-CM | POA: Insufficient documentation

## 2016-08-07 DIAGNOSIS — Z79899 Other long term (current) drug therapy: Secondary | ICD-10-CM | POA: Insufficient documentation

## 2016-08-07 DIAGNOSIS — B349 Viral infection, unspecified: Secondary | ICD-10-CM | POA: Insufficient documentation

## 2016-08-07 LAB — URINALYSIS, ROUTINE W REFLEX MICROSCOPIC
Bilirubin Urine: NEGATIVE
Glucose, UA: NEGATIVE mg/dL
Hgb urine dipstick: NEGATIVE
Ketones, ur: NEGATIVE mg/dL
Leukocytes, UA: NEGATIVE
Nitrite: NEGATIVE
Protein, ur: 30 mg/dL — AB
Specific Gravity, Urine: 1.028 (ref 1.005–1.030)
pH: 6 (ref 5.0–8.0)

## 2016-08-07 LAB — URINALYSIS, MICROSCOPIC (REFLEX)

## 2016-08-07 LAB — PREGNANCY, URINE: Preg Test, Ur: NEGATIVE

## 2016-08-07 MED ORDER — ONDANSETRON 4 MG PO TBDP: 4.0000 mg | ORAL_TABLET | Freq: Three times a day (TID) | ORAL | 0 refills | Status: DC | PRN

## 2016-08-07 MED ORDER — OXYMETAZOLINE HCL 0.05 % NA SOLN: 1.0000 | Freq: Two times a day (BID) | NASAL | 0 refills | Status: DC

## 2016-08-07 MED ORDER — ALBUTEROL SULFATE HFA 108 (90 BASE) MCG/ACT IN AERS
2.0000 | INHALATION_SPRAY | Freq: Once | RESPIRATORY_TRACT | Status: AC
  Administered 2016-08-07: 2 via RESPIRATORY_TRACT
  Filled 2016-08-07: qty 6.7

## 2016-08-07 MED ORDER — IBUPROFEN 400 MG PO TABS
400.0000 mg | ORAL_TABLET | Freq: Once | ORAL | Status: AC
  Administered 2016-08-07: 400 mg via ORAL
  Filled 2016-08-07: qty 1

## 2016-08-07 MED ORDER — BENZONATATE 100 MG PO CAPS: 200.0000 mg | ORAL_CAPSULE | Freq: Two times a day (BID) | ORAL | 0 refills | Status: DC | PRN

## 2016-08-07 NOTE — ED Triage Notes (Signed)
Fever, cough and body aches x 2 days. She took Ibuprofen last night.

## 2016-08-07 NOTE — ED Notes (Signed)
Patient transported to X-ray 

## 2016-08-07 NOTE — ED Notes (Signed)
Pt reports inhaler has helped her feel better. Pt also has drank 8 oz of water and has had no emesis or nausea.

## 2016-08-07 NOTE — ED Provider Notes (Signed)
[facility] Provider Note   CSN: [id number] Arrival date & time: 08/07/16  1224     History   Chief Complaint Chief Complaint  Patient presents with  . Cough  . Fever    HPI CECYLIA BRAZILL is a 36 y.o. female.  HPI   Patient is a 36 year old female with history of bipolar disorder presents the ED with multiple complaints. Patient reports over the past 2-3 days she has had fever, chills, generalized body aches, sinus pressure, nasal congestion, rhinorrhea, sore throat, nonproductive cough. Patient reports having one episode of posttussive emesis yesterday. Reports mild intermittent associated nausea today. She also reports having multiple episodes of nonbloody diarrhea yesterday which has since resolved. Denies headache, visual changes, neck stiffness, hemoptysis, wheezing, shortness of breath, chest pain, abdominal pain, hematemesis, urinary symptoms, blood in urine or stool, vaginal bleeding, vaginal discharge. Patient states she has been taking ibuprofen and DayQuil at home without relief. Denies any known sick contacts. Denies any recent hospitalizations or recent antibiotic use.  Past Medical History:  Diagnosis Date  . Bipolar disorder (HCC)   . Chronic back pain   . Ectopic pregnancy    June 2013  . Ovarian cyst   . Panic attacks     There are no active problems to display for this patient.   Past Surgical History:  Procedure Laterality Date  . LAPAROSCOPY  12/29/2011   Procedure: LAPAROSCOPY OPERATIVE;  Surgeon: Tresa Endo A. Ernestina Penna, MD;  Location: [facility];  Service: Gynecology;  Laterality: Bilateral;  Right Salpingectomy    OB History    Gravida Para Term Preterm AB Living   2 0 0 0 0 0   SAB TAB Ectopic Multiple Live Births   0 0 0 0         Home Medications    Prior to Admission medications   Medication Sig Start Date End Date Taking? Authorizing Provider  albuterol (PROVENTIL HFA;VENTOLIN HFA) 108 (90 Base) MCG/ACT inhaler Inhale 1-2 puffs  into the lungs every 6 (six) hours as needed for wheezing. 11/12/15   Rolland Porter, MD  ALPRAZolam Prudy Feeler) 1 MG tablet Take 1 mg by mouth 3 (three) times daily as needed for anxiety.  08/11/15   Historical Provider, MD  benzonatate (TESSALON) 100 MG capsule Take 2 capsules (200 mg total) by mouth 2 (two) times daily as needed for cough. 08/07/16   Barrett Henle, PA-C  EPINEPHrine 0.3 mg/0.3 mL IJ SOAJ injection Inject 0.3 mLs (0.3 mg total) into the muscle once. 08/21/15   Raeford Razor, MD  HYDROcodone-acetaminophen (NORCO/VICODIN) 5-325 MG tablet Take 1-2 tablets by mouth every 4 (four) hours as needed. 07/16/16   Emi Holes, PA-C  ibuprofen (ADVIL,MOTRIN) 800 MG tablet Take 1 tablet (800 mg total) by mouth 3 (three) times daily. 07/16/16   Emi Holes, PA-C  methocarbamol (ROBAXIN) 500 MG tablet Take 1 tablet (500 mg total) by mouth 2 (two) times daily. 07/16/16   Emi Holes, PA-C  ondansetron (ZOFRAN ODT) 4 MG disintegrating tablet Take 1 tablet (4 mg total) by mouth every 8 (eight) hours as needed for nausea or vomiting. 08/07/16   Barrett Henle, PA-C  oxymetazoline (AFRIN NASAL SPRAY) 0.05 % nasal spray Place 1 spray into both nostrils 2 (two) times daily. Spray once into each nostril twice daily for up to the next 3 days. Do not use for more than 3 days to prevent rebound rhinorrhea. 08/07/16   Barrett Henle, PA-C  Family History No family history on file.  Social History Social History  Substance Use Topics  . Smoking status: Current Every Day Smoker    Packs/day: 0.50    Types: Cigarettes  . Smokeless tobacco: Never Used  . Alcohol use No     Allergies   Rocephin [ceftriaxone sodium in dextrose]   Review of Systems Review of Systems  Constitutional: Positive for chills and fever.  HENT: Positive for congestion, rhinorrhea, sinus pain and sore throat.   Respiratory: Positive for cough and wheezing.   Gastrointestinal: Positive for diarrhea  (resolved), nausea and vomiting (resolved).  Musculoskeletal: Positive for myalgias. Joint swelling: generalized.  All other systems reviewed and are negative.    Physical Exam Updated Vital Signs BP 104/68   Pulse 84   Temp 98.1 F (36.7 C) (Oral)   Resp 18   Ht 5\' 9"  (1.753 m)   Wt 117.9 kg   LMP 07/24/2016   SpO2 97%   BMI 38.40 kg/m   Physical Exam  Constitutional: She is oriented to person, place, and time. She appears well-developed and well-nourished. No distress.  HENT:  Head: Normocephalic and atraumatic.  Right Ear: Tympanic membrane normal.  Left Ear: Tympanic membrane normal.  Nose: Rhinorrhea present. Right sinus exhibits maxillary sinus tenderness and frontal sinus tenderness. Left sinus exhibits maxillary sinus tenderness and frontal sinus tenderness.  Mouth/Throat: Uvula is midline, oropharynx is clear and moist and mucous membranes are normal. No oropharyngeal exudate, posterior oropharyngeal edema, posterior oropharyngeal erythema or tonsillar abscesses. No tonsillar exudate.  Eyes: Conjunctivae and EOM are normal. Right eye exhibits no discharge. Left eye exhibits no discharge. No scleral icterus.  Neck: Normal range of motion. Neck supple.  Cardiovascular: Normal rate, regular rhythm, normal heart sounds and intact distal pulses.   HR 94  Pulmonary/Chest: Effort normal. No respiratory distress. She has wheezes (mild end-expiratory wheezes in bibailar lobes). She has no rales. She exhibits no tenderness.  Abdominal: Soft. Bowel sounds are normal. She exhibits no distension and no mass. There is no tenderness. There is no rebound and no guarding. No hernia.  Musculoskeletal: Normal range of motion. She exhibits no edema.  Lymphadenopathy:    She has cervical adenopathy (bilateral cervical lymphdenopathy).  Neurological: She is alert and oriented to person, place, and time.  Skin: Skin is warm and dry. She is not diaphoretic.  Nursing note and vitals  reviewed.    ED Treatments / Results  Labs (all labs ordered are listed, but only abnormal results are displayed) Labs Reviewed  URINALYSIS, ROUTINE W REFLEX MICROSCOPIC - Abnormal; Notable for the following:       Result Value   APPearance CLOUDY (*)    Protein, ur 30 (*)    All other components within normal limits  URINALYSIS, MICROSCOPIC (REFLEX) - Abnormal; Notable for the following:    Bacteria, UA MANY (*)    Squamous Epithelial / LPF 6-30 (*)    All other components within normal limits  PREGNANCY, URINE    EKG  EKG Interpretation None       Radiology Dg Chest 2 View  Result Date: 08/07/2016 CLINICAL DATA:  Cough and congestion with fever EXAM: CHEST  2 VIEW COMPARISON:  July 16, 2016 FINDINGS: There is no edema or consolidation. Heart size and pulmonary vascularity are normal. No adenopathy. There is slight degenerative change in the thoracic spine. IMPRESSION: No edema or consolidation. Electronically Signed   By: Bretta BangWilliam  Woodruff III M.D.   On: 08/07/2016 13:47  Procedures Procedures (including critical care time)  Medications Ordered in ED Medications  ibuprofen (ADVIL,MOTRIN) tablet 400 mg (400 mg Oral Given 08/07/16 1240)  albuterol (PROVENTIL HFA;VENTOLIN HFA) 108 (90 Base) MCG/ACT inhaler 2 puff (2 puffs Inhalation Given 08/07/16 1456)     Initial Impression / Assessment and Plan / ED Course  I have reviewed the triage vital signs and the nursing notes.  Pertinent labs & imaging results that were available during my care of the patient were reviewed by me and considered in my medical decision making (see chart for details).     Patient with symptoms consistent with influenza.  Vitals are stable, low-grade fever.  No signs of dehydration, tolerating PO's.  Lungs are clear. CXR  Discussed the cost versus benefit of Tamiflu treatment with the patient.  The patient understands that symptoms are greater than the recommended 24-48 hour window of  treatment.  Patient will be discharged with instructions to orally hydrate, rest, and use over-the-counter medications such as anti-inflammatories ibuprofen and Aleve for muscle aches and Tylenol for fever.  Patient will also be given a cough suppressant, decongestant, antiemetic. Discussed return precautions with pt.     Final Clinical Impressions(s) / ED Diagnoses   Final diagnoses:  Viral illness    New Prescriptions New Prescriptions   BENZONATATE (TESSALON) 100 MG CAPSULE    Take 2 capsules (200 mg total) by mouth 2 (two) times daily as needed for cough.   ONDANSETRON (ZOFRAN ODT) 4 MG DISINTEGRATING TABLET    Take 1 tablet (4 mg total) by mouth every 8 (eight) hours as needed for nausea or vomiting.   OXYMETAZOLINE (AFRIN NASAL SPRAY) 0.05 % NASAL SPRAY    Place 1 spray into both nostrils 2 (two) times daily. Spray once into each nostril twice daily for up to the next 3 days. Do not use for more than 3 days to prevent rebound rhinorrhea.     Satira Sark [place], [place] 08/07/16 1555    Lavera Guise, MD 08/07/16 [phone]

## 2016-08-07 NOTE — Discharge Instructions (Signed)
Take your medications as prescribed. I also recommend taking Tylenol and ibuprofen as prescribed over-the-counter, alternating between doses every 3-4 hours. Continue drinking fluids at home to remain hydrated. I recommend eating a bland diet for the next few days and taper symptoms have improved. °Follow-up with your primary care provider in the next 3-4 days if symptoms have not improved. °Return to the emergency department if symptoms worsen or new onset of headache, neck stiffness, difficulty breathing, coughing up blood, chest pain, abdominal pain, vomiting, unable to keep fluids down.  °

## 2016-10-14 ENCOUNTER — Emergency Department (HOSPITAL_COMMUNITY): Payer: 59

## 2016-10-14 ENCOUNTER — Emergency Department (HOSPITAL_COMMUNITY)
Admission: EM | Admit: 2016-10-14 | Discharge: 2016-10-14 | Disposition: A | Discharge status: Home / Self Care | Payer: 59 | Attending: Emergency Medicine | Admitting: Emergency Medicine

## 2016-10-14 ENCOUNTER — Encounter (HOSPITAL_COMMUNITY): Payer: Self-pay | Admitting: Nurse Practitioner

## 2016-10-14 DIAGNOSIS — R1084 Generalized abdominal pain: Secondary | ICD-10-CM | POA: Insufficient documentation

## 2016-10-14 DIAGNOSIS — R109 Unspecified abdominal pain: Secondary | ICD-10-CM

## 2016-10-14 DIAGNOSIS — Z79899 Other long term (current) drug therapy: Secondary | ICD-10-CM | POA: Insufficient documentation

## 2016-10-14 HISTORY — PX: COLONOSCOPY: SHX174

## 2016-10-14 LAB — URINALYSIS, ROUTINE W REFLEX MICROSCOPIC
Bilirubin Urine: NEGATIVE
Glucose, UA: NEGATIVE mg/dL
Hgb urine dipstick: NEGATIVE
Ketones, ur: NEGATIVE mg/dL
Nitrite: NEGATIVE
Protein, ur: NEGATIVE mg/dL
Specific Gravity, Urine: 1.003 — ABNORMAL LOW (ref 1.005–1.030)
pH: 6 (ref 5.0–8.0)

## 2016-10-14 LAB — COMPREHENSIVE METABOLIC PANEL
ALT: 9 U/L — ABNORMAL LOW (ref 14–54)
AST: 17 U/L (ref 15–41)
Albumin: 3.4 g/dL — ABNORMAL LOW (ref 3.5–5.0)
Alkaline Phosphatase: 57 U/L (ref 38–126)
Anion gap: 4 — ABNORMAL LOW (ref 5–15)
BUN: 10 mg/dL (ref 6–20)
CO2: 25 mmol/L (ref 22–32)
Calcium: 8.6 mg/dL — ABNORMAL LOW (ref 8.9–10.3)
Chloride: 108 mmol/L (ref 101–111)
Creatinine, Ser: 0.92 mg/dL (ref 0.44–1.00)
GFR calc Af Amer: >60 mL/min (ref >60)
GFR calc non Af Amer: >60 mL/min (ref >60)
Glucose, Bld: 91 mg/dL (ref 65–99)
Potassium: 4 mmol/L (ref 3.5–5.1)
Sodium: 137 mmol/L (ref 135–145)
Total Bilirubin: 0.3 mg/dL (ref 0.3–1.2)
Total Protein: 6.3 g/dL — ABNORMAL LOW (ref 6.5–8.1)

## 2016-10-14 LAB — CBC
HCT: 34 % — ABNORMAL LOW (ref 36.0–46.0)
Hemoglobin: 9.9 g/dL — ABNORMAL LOW (ref 12.0–15.0)
MCH: 21.2 pg — ABNORMAL LOW (ref 26.0–34.0)
MCHC: 29.1 g/dL — ABNORMAL LOW (ref 30.0–36.0)
MCV: 73 fL — ABNORMAL LOW (ref 78.0–100.0)
Platelets: 255 10*3/uL (ref 150–400)
RBC: 4.66 MIL/uL (ref 3.87–5.11)
RDW: 17.4 % — ABNORMAL HIGH (ref 11.5–15.5)
WBC: 7.4 10*3/uL (ref 4.0–10.5)

## 2016-10-14 LAB — LIPASE, BLOOD: Lipase: 25 U/L (ref 11–51)

## 2016-10-14 LAB — I-STAT BETA HCG BLOOD, ED (MC, WL, AP ONLY): I-stat hCG, quantitative: <5 m[IU]/mL (ref <5)

## 2016-10-14 LAB — POC OCCULT BLOOD, ED: Fecal Occult Bld: NEGATIVE

## 2016-10-14 MED ORDER — ONDANSETRON 4 MG PO TBDP: 4.0000 mg | ORAL_TABLET | Freq: Three times a day (TID) | ORAL | 0 refills | Status: DC | PRN

## 2016-10-14 MED ORDER — IOPAMIDOL (ISOVUE-300) INJECTION 61%
INTRAVENOUS | Status: AC
  Administered 2016-10-14: 100 mL
  Filled 2016-10-14: qty 100

## 2016-10-14 MED ORDER — ONDANSETRON HCL 4 MG/2ML IJ SOLN
4.0000 mg | Freq: Once | INTRAMUSCULAR | Status: AC
  Administered 2016-10-14: 4 mg via INTRAVENOUS
  Filled 2016-10-14: qty 2

## 2016-10-14 MED ORDER — NAPROXEN 500 MG PO TABS: 500.0000 mg | ORAL_TABLET | Freq: Two times a day (BID) | ORAL | 0 refills | Status: DC

## 2016-10-14 MED ORDER — FERROUS SULFATE 325 (65 FE) MG PO TABS: 325.0000 mg | ORAL_TABLET | Freq: Every day | ORAL | 0 refills | Status: DC

## 2016-10-14 NOTE — Discharge Instructions (Signed)
Increase hydration. Follow a high fiber diet.   Take iron supplement daily. Zofran as needed for nausea.   Please follow up with your primary care provider. I would also like you to follow up with the GI clinic listed. Please call them today or tomorrow to schedule a follow up appointment.   Please seek immediate care if you develop any of the following symptoms: The pain does not go away.  You have a fever.  You keep throwing up and can't keep fluids down. You pass bloody or black tarry stools.  You do not seem to be getting better.  You have any questions or concerns.

## 2016-10-14 NOTE — ED Provider Notes (Signed)
[facility] Provider Note   CSN: [id number] Arrival date & time: 10/14/16  0930     History   Chief Complaint Chief Complaint  Patient presents with  . Abdominal Pain    HPI ALEXIS REBER is a 36 y.o. female.  The history is provided by the patient and medical records. No language interpreter was used.  Abdominal Pain   Associated symptoms include nausea, vomiting and constipation. Pertinent negatives include diarrhea and dysuria.   JANARA KLETT is a 36 y.o. female  with a PMH of bipolar disorder, chronic back pain,prior  ectopic pregnancy with right salpingoopherectomy in 2012 who presents to the Emergency Department complaining of progressively worsening generalized abdominal pain, most significantly in left upper quadrant and lower abdomen. Pain is constant and sharp, stabbing in nature. Has been occurring for around 6 weeks, but acutely worsened over the last 4-5 days. She has not had a BM in two days, but prior to this, she did have bright red blood on firm stool. Hx of hemorrhoids and feel that this could be contributory. Pain has progressively worsened despite tylenol, ibuprofen and heating packs. She has been very nauseous and unable to keep food down for the last 3-4 days. She continues to vomit after any food, however she has kept water down last night and this morning. She endorses feeling very weak over the last two weeks. She went to pcp office today for appointment and states that when she got there, she was signing in and "nearly passed out". No vaginal discharge, fevers, chest pain or trouble breathing.    Past Medical History:  Diagnosis Date  . Bipolar disorder (HCC)   . Chronic back pain   . Ectopic pregnancy    June 2013  . Ovarian cyst   . Panic attacks     There are no active problems to display for this patient.   Past Surgical History:  Procedure Laterality Date  . LAPAROSCOPY  12/29/2011   Procedure: LAPAROSCOPY OPERATIVE;  Surgeon: Tresa Endo A.  Ernestina Penna, MD;  Location: [facility];  Service: Gynecology;  Laterality: Bilateral;  Right Salpingectomy    OB History    Gravida Para Term Preterm AB Living   2 0 0 0 0 0   SAB TAB Ectopic Multiple Live Births   0 0 0 0         Home Medications    Prior to Admission medications   Medication Sig Start Date End Date Taking? Authorizing Provider  ALPRAZolam Prudy Feeler) 1 MG tablet Take 1 mg by mouth 3 (three) times daily as needed for anxiety.  08/11/15  Yes Historical Provider, MD  ibuprofen (ADVIL,MOTRIN) 800 MG tablet Take 1 tablet (800 mg total) by mouth 3 (three) times daily. 07/16/16  Yes Alexandra M Law, PA-C  risperiDONE (RISPERDAL) 0.25 MG tablet Take by mouth at bedtime.   Yes Historical Provider, MD  traZODone (DESYREL) 100 MG tablet Take 200 mg by mouth at bedtime.   Yes Historical Provider, MD  albuterol (PROVENTIL HFA;VENTOLIN HFA) 108 (90 Base) MCG/ACT inhaler Inhale 1-2 puffs into the lungs every 6 (six) hours as needed for wheezing. Patient not taking: Reported on 10/14/2016 11/12/15   Rolland Porter, MD  benzonatate (TESSALON) 100 MG capsule Take 2 capsules (200 mg total) by mouth 2 (two) times daily as needed for cough. Patient not taking: Reported on 10/14/2016 08/07/16   Barrett Henle, PA-C  EPINEPHrine 0.3 mg/0.3 mL IJ SOAJ injection Inject 0.3 mLs (0.3 mg total) into  the muscle once. 08/21/15   Raeford Razor, MD  ferrous sulfate 325 (65 FE) MG tablet Take 1 tablet (325 mg total) by mouth daily. 10/14/16   Chase Picket Ward, PA-C  HYDROcodone-acetaminophen (NORCO/VICODIN) 5-325 MG tablet Take 1-2 tablets by mouth every 4 (four) hours as needed. Patient not taking: Reported on 10/14/2016 07/16/16   Emi Holes, PA-C  methocarbamol (ROBAXIN) 500 MG tablet Take 1 tablet (500 mg total) by mouth 2 (two) times daily. Patient not taking: Reported on 10/14/2016 07/16/16   Waylan Boga Law, PA-C  ondansetron (ZOFRAN ODT) 4 MG disintegrating tablet Take 1 tablet (4 mg total) by mouth every 8  (eight) hours as needed for nausea or vomiting. 10/14/16   Chase Picket Ward, PA-C  oxymetazoline (AFRIN NASAL SPRAY) 0.05 % nasal spray Place 1 spray into both nostrils 2 (two) times daily. Spray once into each nostril twice daily for up to the next 3 days. Do not use for more than 3 days to prevent rebound rhinorrhea. Patient not taking: Reported on 10/14/2016 08/07/16   Barrett Henle, PA-C    Family History No family history on file.  Social History Social History  Substance Use Topics  . Smoking status: Current Every Day Smoker    Packs/day: 0.25    Types: Cigarettes  . Smokeless tobacco: Never Used  . Alcohol use No     Comment: ocassionally      Allergies   Rocephin [ceftriaxone sodium in dextrose]   Review of Systems Review of Systems  Constitutional: Positive for chills and fatigue.  Gastrointestinal: Positive for abdominal pain, blood in stool, constipation, nausea and vomiting. Negative for diarrhea.  Genitourinary: Negative for dysuria, vaginal bleeding and vaginal discharge.  Neurological: Positive for weakness.  All other systems reviewed and are negative.    Physical Exam Updated Vital Signs BP 112/71   Pulse (!) 58   Temp 97.5 F (36.4 C) (Oral)   Resp 18   Ht  (1.753 m)   Wt 117.9 kg   LMP 09/27/2016   SpO2 99%   BMI 38.40 kg/m   Physical Exam  Constitutional: She is oriented to person, place, and time. She appears well-developed and well-nourished. No distress.  HENT:  Head: Normocephalic and atraumatic.  Cardiovascular: Normal rate, regular rhythm and normal heart sounds.   No murmur heard. Pulmonary/Chest: Effort normal and breath sounds normal. No respiratory distress.  Abdominal: Soft. Bowel sounds are normal. She exhibits no distension.  Generalized abdominal tenderness. No rebound tenderness. No CVA tenderness.   Musculoskeletal: Normal range of motion.  Neurological: She is alert and oriented to person, place, and time.    Skin: Skin is warm and dry.  Nursing note and vitals reviewed.    ED Treatments / Results  Labs (all labs ordered are listed, but only abnormal results are displayed) Labs Reviewed  COMPREHENSIVE METABOLIC PANEL - Abnormal; Notable for the following:       Result Value   Calcium 8.6 (*)    Total Protein 6.3 (*)    Albumin 3.4 (*)    ALT 9 (*)    Anion gap 4 (*)    All other components within normal limits  CBC - Abnormal; Notable for the following:    Hemoglobin 9.9 (*)    HCT 34.0 (*)    MCV 73.0 (*)    MCH 21.2 (*)    MCHC 29.1 (*)    RDW 17.4 (*)    All other components within normal limits  URINALYSIS, ROUTINE W REFLEX MICROSCOPIC - Abnormal; Notable for the following:    APPearance CLOUDY (*)    Specific Gravity, Urine 1.003 (*)    Leukocytes, UA TRACE (*)    Bacteria, UA RARE (*)    Squamous Epithelial / LPF 6-30 (*)    All other components within normal limits  LIPASE, BLOOD  I-STAT BETA HCG BLOOD, ED ([facility], [facility], [facility] ONLY)  POC OCCULT BLOOD, ED    EKG  EKG Interpretation None       Radiology Ct Abdomen Pelvis W Contrast  Result Date: 10/14/2016 CLINICAL DATA:  36 year old female with lower abdominal pain, nausea and bloating for the last 6 weeks EXAM: CT ABDOMEN AND PELVIS WITH CONTRAST TECHNIQUE: Multidetector CT imaging of the abdomen and pelvis was performed using the standard protocol following bolus administration of intravenous contrast. CONTRAST:  ISOVUE-300 IOPAMIDOL (ISOVUE-300) INJECTION 61% COMPARISON:  Prior CT scan of the abdomen and pelvis 01/05/2007 FINDINGS: Lower chest: The lung bases are clear. Visualized cardiac structures are within normal limits for size. No pericardial effusion. Unremarkable visualized distal thoracic esophagus. Hepatobiliary: Normal hepatic contour and morphology. No discrete hepatic lesions. Normal appearance of the gallbladder. No intra or extrahepatic biliary ductal dilatation. Pancreas: Unremarkable. No pancreatic  ductal dilatation or surrounding inflammatory changes. Spleen: Normal in size without focal abnormality. Adrenals/Urinary Tract: Adrenal glands are unremarkable. Kidneys are normal, without renal calculi, focal lesion, or hydronephrosis. Bladder is unremarkable. Stomach/Bowel: No evidence of obstruction or focal bowel wall thickening. Normal appendix in the right lower quadrant. The terminal ileum is unremarkable. Vascular/Lymphatic: No significant vascular findings are present. No enlarged abdominal or pelvic lymph nodes. Reproductive: Normal appearance of the uterus. Water attenuation cyst affiliated with the right ovary measures 5.2 x 4.1 x 3.6 cm. The left ovary is unremarkable. Other: No abdominal wall hernia or abnormality. No abdominopelvic ascites. Musculoskeletal: No acute fracture or aggressive appearing lytic or blastic osseous lesion. IMPRESSION: 1. Simple right ovarian cyst measuring 5.2 x 4.1 x 3.6 cm which might represent a source for lower, or right lower quadrant abdominal pain. This is almost certainly benign, and no specific imaging follow up is recommended according to the Society of Radiologists in Ultrasound2010 Consensus Conference Statement (D Lenis Noon et al. Management of Asymptomatic Ovarian and Other Adnexal Cysts Imaged at [place]: Society of Radiologists in Ultrasound Consensus Conference Statement 2010. Radiology 256 (Sept 2010): 943-954.). 2. Otherwise, unremarkable CT scan of the abdomen and pelvis. Electronically Signed   By: Malachy Moan M.D.   On: 10/14/2016 11:49    Procedures Procedures (including critical care time)  Medications Ordered in ED Medications  iopamidol (ISOVUE-300) 61 % injection (100 mLs  Contrast Given 10/14/16 1120)  ondansetron (ZOFRAN) injection 4 mg (4 mg Intravenous Given 10/14/16 1209)     Initial Impression / Assessment and Plan / ED Course  I have reviewed the triage vital signs and the nursing notes.  Pertinent labs & imaging results that were  available during my care of the patient were reviewed by me and considered in my medical decision making (see chart for details).    SHANEEKA SCARBORO is a 36 y.o. female who presents to ED for generalized abdominal pain for 6 weeks, acutely worsening over the last 4-5 days. On exam, patient is afebrile, hemodynamically stable with no peritoneal signs. Urine does not appear infectious. CBC with anemia (H&H 9.9/34). Hemoccult negative. CT shows right ovarian cyst, otherwise negative. Patient is not overtly tender to the right lower quadrant as  her complaints are mostly upper and left-sided. Doubt this is cause of her pain. CT shows no other explanation for her abdominal pain today. She is tolerating PO and appears stable for discharge to home with outpatient follow-up. Given anemia, will start patient on iron. She is able to ambulate in department without difficulty. Understands to follow up if worse. All questions answered.    Final Clinical Impressions(s) / ED Diagnoses   Final diagnoses:  Abdominal pain    New Prescriptions New Prescriptions   FERROUS SULFATE 325 (65 FE) MG TABLET    Take 1 tablet (325 mg total) by mouth daily.   ONDANSETRON (ZOFRAN ODT) 4 MG DISINTEGRATING TABLET    Take 1 tablet (4 mg total) by mouth every 8 (eight) hours as needed for nausea or vomiting.     [facility] Ward, PA-C 10/14/16 1218    Azalia Bilis, MD 10/14/16 1539

## 2016-10-14 NOTE — ED Notes (Signed)
Pt to CT

## 2016-10-14 NOTE — ED Triage Notes (Signed)
Per EMS pt from PCP due to abdominal pain and bloating over 6 weeks but getting severe over the last week. Was seen for this last Thursday with negative pregnancy test (pt actively trying to get pregnant). Patient states has been feeling lethargic since Friday but was able to drive her self to doctors appointment today. Patient states that she has had to pull over when driving this past week to sleep due to lethargy and fatigue. Endorses nausea, mild chills, vomits when she tries to eat. Patient denies diarrhea, last BM 2 days ago- with straining. Patient takes OTC tums and pantoprazole for GERD.

## 2017-03-18 ENCOUNTER — Emergency Department (HOSPITAL_BASED_OUTPATIENT_CLINIC_OR_DEPARTMENT_OTHER)
Admission: EM | Admit: 2017-03-18 | Discharge: 2017-03-18 | Disposition: A | Discharge status: Home / Self Care | Payer: Self-pay | Attending: Physician Assistant | Admitting: Physician Assistant

## 2017-03-18 ENCOUNTER — Emergency Department (HOSPITAL_BASED_OUTPATIENT_CLINIC_OR_DEPARTMENT_OTHER): Payer: Self-pay

## 2017-03-18 ENCOUNTER — Encounter (HOSPITAL_BASED_OUTPATIENT_CLINIC_OR_DEPARTMENT_OTHER): Payer: Self-pay | Admitting: *Deleted

## 2017-03-18 DIAGNOSIS — R05 Cough: Secondary | ICD-10-CM | POA: Insufficient documentation

## 2017-03-18 DIAGNOSIS — Z79899 Other long term (current) drug therapy: Secondary | ICD-10-CM | POA: Insufficient documentation

## 2017-03-18 DIAGNOSIS — F17210 Nicotine dependence, cigarettes, uncomplicated: Secondary | ICD-10-CM | POA: Insufficient documentation

## 2017-03-18 DIAGNOSIS — R55 Syncope and collapse: Secondary | ICD-10-CM | POA: Insufficient documentation

## 2017-03-18 DIAGNOSIS — R5382 Chronic fatigue, unspecified: Secondary | ICD-10-CM | POA: Insufficient documentation

## 2017-03-18 LAB — CBC
HCT: 37.6 % (ref 36.0–46.0)
Hemoglobin: 12 g/dL (ref 12.0–15.0)
MCH: 26.5 pg (ref 26.0–34.0)
MCHC: 31.9 g/dL (ref 30.0–36.0)
MCV: 83 fL (ref 78.0–100.0)
Platelets: 270 10*3/uL (ref 150–400)
RBC: 4.53 MIL/uL (ref 3.87–5.11)
RDW: 17 % — ABNORMAL HIGH (ref 11.5–15.5)
WBC: 6.5 10*3/uL (ref 4.0–10.5)

## 2017-03-18 LAB — BASIC METABOLIC PANEL
Anion gap: 6 (ref 5–15)
BUN: 11 mg/dL (ref 6–20)
CO2: 24 mmol/L (ref 22–32)
Calcium: 9 mg/dL (ref 8.9–10.3)
Chloride: 105 mmol/L (ref 101–111)
Creatinine, Ser: 0.95 mg/dL (ref 0.44–1.00)
GFR calc Af Amer: >60 mL/min (ref >60)
GFR calc non Af Amer: >60 mL/min (ref >60)
Glucose, Bld: 98 mg/dL (ref 65–99)
Potassium: 4.2 mmol/L (ref 3.5–5.1)
Sodium: 135 mmol/L (ref 135–145)

## 2017-03-18 LAB — URINALYSIS, ROUTINE W REFLEX MICROSCOPIC
Bilirubin Urine: NEGATIVE
Glucose, UA: NEGATIVE mg/dL
Hgb urine dipstick: NEGATIVE
Ketones, ur: NEGATIVE mg/dL
Leukocytes, UA: NEGATIVE
Nitrite: NEGATIVE
Protein, ur: NEGATIVE mg/dL
Specific Gravity, Urine: <1.005 — ABNORMAL LOW (ref 1.005–1.030)
pH: 6 (ref 5.0–8.0)

## 2017-03-18 LAB — TSH: TSH: 0.93 u[IU]/mL (ref 0.350–4.500)

## 2017-03-18 LAB — PREGNANCY, URINE: Preg Test, Ur: NEGATIVE

## 2017-03-18 LAB — CBG MONITORING, ED: Glucose-Capillary: 86 mg/dL (ref 65–99)

## 2017-03-18 NOTE — ED Triage Notes (Addendum)
States for the past few days she can't stay awake. States she keeps passing out. She is awake and giving information with her eyes closed at triage. Alert oriented. Eyelid fluttering at triage while temp was being obtained. She leaned to the left with eyes closed but stopped leaning to prevent herself from falling out of the chair. Opens her eyes immediately upon name call. Ambulatory to waiting room.

## 2017-03-18 NOTE — Discharge Instructions (Signed)
We are unsure why feeling so tired. Your hemoglobin today is 12. All of your labs are normal as are your vital signs . We suspect that you may have a virus. Please follow-up with her primary care physician. You may need to be tested for obstructive sleep apnea as an outpatient. You should follow-up about your thyroid studies as well.

## 2017-03-18 NOTE — ED Provider Notes (Signed)
[facility] Provider Note   CSN: [id number] Arrival date & time: 03/18/17  1424     History   Chief Complaint Chief Complaint  Patient presents with  . Fatigue  . Loss of Consciousness    HPI Evelyn Lopez is a 36 y.o. female.  HPI    36 year old female with history of chronic back pain bipolar disorder presenting today with fatigue. She reports very exhausted at home. She reports she was diagnosed with anemia 6 months ago and started on iron pills. She does have mild cough. No congestion no fevers no nausea no vomiting no other symptoms.  Diagnosis apnea. She reports restorative sleep at home.  Past Medical History:  Diagnosis Date  . Bipolar disorder (HCC)   . Chronic back pain   . Ectopic pregnancy    June 2013  . Ovarian cyst   . Panic attacks     There are no active problems to display for this patient.   Past Surgical History:  Procedure Laterality Date  . LAPAROSCOPY  12/29/2011   Procedure: LAPAROSCOPY OPERATIVE;  Surgeon: Tresa Endo A. Ernestina Penna, MD;  Location: [facility];  Service: Gynecology;  Laterality: Bilateral;  Right Salpingectomy    OB History    Gravida Para Term Preterm AB Living   2 0 0 0 0 0   SAB TAB Ectopic Multiple Live Births   0 0 0 0         Home Medications    Prior to Admission medications   Medication Sig Start Date End Date Taking? Authorizing Provider  albuterol (PROVENTIL HFA;VENTOLIN HFA) 108 (90 Base) MCG/ACT inhaler Inhale 1-2 puffs into the lungs every 6 (six) hours as needed for wheezing. Patient not taking: Reported on 10/14/2016 11/12/15   Rolland Porter, MD  ALPRAZolam Prudy Feeler) 1 MG tablet Take 1 mg by mouth 3 (three) times daily as needed for anxiety.  08/11/15   [provider]  benzonatate (TESSALON) 100 MG capsule Take 2 capsules (200 mg total) by mouth 2 (two) times daily as needed for cough. Patient not taking: Reported on 10/14/2016 08/07/16   Barrett Henle, PA-C  EPINEPHrine 0.3 mg/0.3 mL IJ  SOAJ injection Inject 0.3 mLs (0.3 mg total) into the muscle once. 08/21/15   Raeford Razor, MD  ferrous sulfate 325 (65 FE) MG tablet Take 1 tablet (325 mg total) by mouth daily. 10/14/16   Ward, Chase Picket, PA-C  HYDROcodone-acetaminophen (NORCO/VICODIN) 5-325 MG tablet Take 1-2 tablets by mouth every 4 (four) hours as needed. Patient not taking: Reported on 10/14/2016 07/16/16   Emi Holes, PA-C  ibuprofen (ADVIL,MOTRIN) 800 MG tablet Take 1 tablet (800 mg total) by mouth 3 (three) times daily. 07/16/16   Law, Waylan Boga, PA-C  methocarbamol (ROBAXIN) 500 MG tablet Take 1 tablet (500 mg total) by mouth 2 (two) times daily. Patient not taking: Reported on 10/14/2016 07/16/16   Emi Holes, PA-C  naproxen (NAPROSYN) 500 MG tablet Take 1 tablet (500 mg total) by mouth 2 (two) times daily. 10/14/16   Ward, Chase Picket, PA-C  ondansetron (ZOFRAN ODT) 4 MG disintegrating tablet Take 1 tablet (4 mg total) by mouth every 8 (eight) hours as needed for nausea or vomiting. 10/14/16   Ward, Chase Picket, PA-C  oxymetazoline (AFRIN NASAL SPRAY) 0.05 % nasal spray Place 1 spray into both nostrils 2 (two) times daily. Spray once into each nostril twice daily for up to the next 3 days. Do not use for more than 3 days  to prevent rebound rhinorrhea. Patient not taking: Reported on 10/14/2016 08/07/16   Barrett Henle, PA-C  risperiDONE (RISPERDAL) 0.25 MG tablet Take by mouth at bedtime.    [provider]  traZODone (DESYREL) 100 MG tablet Take 200 mg by mouth at bedtime.    [provider]    Family History No family history on file.  Social History Social History  Substance Use Topics  . Smoking status: Current Every Day Smoker    Packs/day: 0.25    Types: Cigarettes  . Smokeless tobacco: Never Used  . Alcohol use No     Comment: ocassionally      Allergies   Rocephin [ceftriaxone sodium in dextrose]   Review of Systems Review of Systems  Constitutional: Positive for  fatigue. Negative for activity change.  Respiratory: Negative for shortness of breath.   Cardiovascular: Negative for chest pain.  Gastrointestinal: Negative for abdominal pain.     Physical Exam Updated Vital Signs BP (!) 142/112   Pulse 86   Temp 97.7 F (36.5 C) (Oral)   Resp 14   Ht  (1.753 m)   Wt 104.3 kg (230 lb)   LMP 02/28/2017   SpO2 100%   BMI 33.97 kg/m   Physical Exam  Constitutional: She is oriented to person, place, and time. She appears well-developed and well-nourished.  HENT:  Head: Normocephalic and atraumatic.  Eyes: Right eye exhibits no discharge.  Cardiovascular: Normal rate and regular rhythm.   No murmur heard. Pulmonary/Chest: Effort normal and breath sounds normal. No respiratory distress. She has no wheezes.  Abdominal: Soft. She exhibits no distension. There is no tenderness.  Musculoskeletal: She exhibits no edema.  Neurological: She is oriented to person, place, and time.  Skin: Skin is warm and dry. She is not diaphoretic.  Psychiatric: She has a normal mood and affect.  Nursing note and vitals reviewed.    ED Treatments / Results  Labs (all labs ordered are listed, but only abnormal results are displayed) Labs Reviewed  CBC - Abnormal; Notable for the following:       Result Value   RDW 17.0 (*)    All other components within normal limits  BASIC METABOLIC PANEL  URINALYSIS, ROUTINE W REFLEX MICROSCOPIC  PREGNANCY, URINE  TSH  CBG MONITORING, ED    EKG  EKG Interpretation  Date/Time:  Thursday March 18 2017 15:03:22 EDT Ventricular Rate:  83 PR Interval:  138 QRS Duration: 80 QT Interval:  384 QTC Calculation: 451 R Axis:   64 Text Interpretation:  Normal sinus rhythm T wave abnormality, consider anterior ischemia Abnormal ECG No significant change since last tracing Confirmed by Corlis Leak, Conny Situ ([id number]) on 03/18/2017 4:21:49 PM       Radiology No results found.  Procedures Procedures (including critical  care time)  Medications Ordered in ED Medications - No data to display   Initial Impression / Assessment and Plan / ED Course  I have reviewed the triage vital signs and the nursing notes.  Pertinent labs & imaging results that were available during my care of the patient were reviewed by me and considered in my medical decision making (see chart for details).     Well-appearing 36 year old female presenting with fatigue. We'll check patient's hemoglobin, white count. Will get chest x-ray given her recent cough. Otherwise patient has no focal symptoms. We'll send thyroid and have patient primary care following up.  Labs are all reassuring. Chest x-ray normal. We'll provide a work note as  requested. Have her follow-up with her primary care physician.  Final Clinical Impressions(s) / ED Diagnoses   Final diagnoses:  None    New Prescriptions New Prescriptions   No medications on file     Abelino Derrick, MD 03/18/17 2319

## 2017-03-18 NOTE — ED Notes (Signed)
Pt is able to text with no difficulty while waiting for an exam room.

## 2017-05-28 ENCOUNTER — Encounter (HOSPITAL_BASED_OUTPATIENT_CLINIC_OR_DEPARTMENT_OTHER): Payer: Self-pay | Admitting: *Deleted

## 2017-05-28 ENCOUNTER — Other Ambulatory Visit: Payer: Self-pay

## 2017-05-28 ENCOUNTER — Emergency Department (HOSPITAL_BASED_OUTPATIENT_CLINIC_OR_DEPARTMENT_OTHER)
Admission: EM | Admit: 2017-05-28 | Discharge: 2017-05-28 | Disposition: A | Discharge status: Home / Self Care | Payer: PRIVATE HEALTH INSURANCE | Attending: Emergency Medicine | Admitting: Emergency Medicine

## 2017-05-28 ENCOUNTER — Emergency Department (HOSPITAL_BASED_OUTPATIENT_CLINIC_OR_DEPARTMENT_OTHER): Payer: PRIVATE HEALTH INSURANCE

## 2017-05-28 DIAGNOSIS — Z79899 Other long term (current) drug therapy: Secondary | ICD-10-CM | POA: Insufficient documentation

## 2017-05-28 DIAGNOSIS — N730 Acute parametritis and pelvic cellulitis: Secondary | ICD-10-CM | POA: Diagnosis not present

## 2017-05-28 DIAGNOSIS — N939 Abnormal uterine and vaginal bleeding, unspecified: Secondary | ICD-10-CM

## 2017-05-28 DIAGNOSIS — R109 Unspecified abdominal pain: Secondary | ICD-10-CM | POA: Diagnosis not present

## 2017-05-28 DIAGNOSIS — F17210 Nicotine dependence, cigarettes, uncomplicated: Secondary | ICD-10-CM | POA: Insufficient documentation

## 2017-05-28 LAB — URINALYSIS, ROUTINE W REFLEX MICROSCOPIC
Bilirubin Urine: NEGATIVE
Glucose, UA: NEGATIVE mg/dL
Ketones, ur: NEGATIVE mg/dL
Leukocytes, UA: NEGATIVE
Nitrite: NEGATIVE
Protein, ur: NEGATIVE mg/dL
Specific Gravity, Urine: 1.025 (ref 1.005–1.030)
pH: 6 (ref 5.0–8.0)

## 2017-05-28 LAB — COMPREHENSIVE METABOLIC PANEL
ALT: 10 U/L — ABNORMAL LOW (ref 14–54)
AST: 23 U/L (ref 15–41)
Albumin: 4 g/dL (ref 3.5–5.0)
Alkaline Phosphatase: 72 U/L (ref 38–126)
Anion gap: 7 (ref 5–15)
BUN: 13 mg/dL (ref 6–20)
CO2: 27 mmol/L (ref 22–32)
Calcium: 8.7 mg/dL — ABNORMAL LOW (ref 8.9–10.3)
Chloride: 104 mmol/L (ref 101–111)
Creatinine, Ser: 0.88 mg/dL (ref 0.44–1.00)
GFR calc Af Amer: >60 mL/min (ref >60)
GFR calc non Af Amer: >60 mL/min (ref >60)
Glucose, Bld: 90 mg/dL (ref 65–99)
Potassium: 4.2 mmol/L (ref 3.5–5.1)
Sodium: 138 mmol/L (ref 135–145)
Total Bilirubin: 0.5 mg/dL (ref 0.3–1.2)
Total Protein: 7.5 g/dL (ref 6.5–8.1)

## 2017-05-28 LAB — LIPASE, BLOOD: Lipase: 24 U/L (ref 11–51)

## 2017-05-28 LAB — CBC WITH DIFFERENTIAL/PLATELET
Basophils Absolute: 0 10*3/uL (ref 0.0–0.1)
Basophils Relative: 1 %
Eosinophils Absolute: 0.2 10*3/uL (ref 0.0–0.7)
Eosinophils Relative: 4 %
HCT: 39.1 % (ref 36.0–46.0)
Hemoglobin: 12.6 g/dL (ref 12.0–15.0)
Lymphocytes Relative: 24 %
Lymphs Abs: 1.5 10*3/uL (ref 0.7–4.0)
MCH: 26.9 pg (ref 26.0–34.0)
MCHC: 32.2 g/dL (ref 30.0–36.0)
MCV: 83.4 fL (ref 78.0–100.0)
Monocytes Absolute: 0.4 10*3/uL (ref 0.1–1.0)
Monocytes Relative: 7 %
Neutro Abs: 4 10*3/uL (ref 1.7–7.7)
Neutrophils Relative %: 64 %
Platelets: 325 10*3/uL (ref 150–400)
RBC: 4.69 MIL/uL (ref 3.87–5.11)
RDW: 14.9 % (ref 11.5–15.5)
WBC: 6.1 10*3/uL (ref 4.0–10.5)

## 2017-05-28 LAB — WET PREP, GENITAL
Sperm: NONE SEEN
Trich, Wet Prep: NONE SEEN
Yeast Wet Prep HPF POC: NONE SEEN

## 2017-05-28 LAB — URINALYSIS, MICROSCOPIC (REFLEX): WBC, UA: NONE SEEN WBC/hpf (ref 0–5)

## 2017-05-28 LAB — PREGNANCY, URINE: Preg Test, Ur: NEGATIVE

## 2017-05-28 MED ORDER — SODIUM CHLORIDE 0.9 % IV BOLUS (SEPSIS)
1000.0000 mL | Freq: Once | INTRAVENOUS | Status: AC
  Administered 2017-05-28: 1000 mL via INTRAVENOUS

## 2017-05-28 MED ORDER — DOXYCYCLINE HYCLATE 100 MG PO CAPS: 100.0000 mg | ORAL_CAPSULE | Freq: Two times a day (BID) | ORAL | 0 refills | Status: DC

## 2017-05-28 MED ORDER — HYDROMORPHONE HCL 1 MG/ML IJ SOLN
0.5000 mg | INTRAMUSCULAR | Status: DC | PRN
  Administered 2017-05-28 (×2): 0.5 mg via INTRAVENOUS
  Filled 2017-05-28 (×2): qty 1

## 2017-05-28 MED ORDER — AZITHROMYCIN 250 MG PO TABS
1000.0000 mg | ORAL_TABLET | Freq: Once | ORAL | Status: AC
  Administered 2017-05-28: 1000 mg via ORAL
  Filled 2017-05-28: qty 4

## 2017-05-28 MED ORDER — NAPROXEN 500 MG PO TABS: 500.0000 mg | ORAL_TABLET | Freq: Two times a day (BID) | ORAL | 0 refills | Status: DC

## 2017-05-28 MED ORDER — GENTAMICIN SULFATE 40 MG/ML IJ SOLN
240.0000 mg | Freq: Once | INTRAMUSCULAR | Status: AC
  Administered 2017-05-28: 240 mg via INTRAMUSCULAR
  Filled 2017-05-28: qty 6

## 2017-05-28 MED ORDER — ONDANSETRON HCL 4 MG/2ML IJ SOLN
4.0000 mg | Freq: Once | INTRAMUSCULAR | Status: AC
  Administered 2017-05-28: 4 mg via INTRAVENOUS
  Filled 2017-05-28: qty 2

## 2017-05-28 MED ORDER — SODIUM CHLORIDE 0.9 % IV SOLN: INTRAVENOUS | Status: DC

## 2017-05-28 MED ORDER — KETOROLAC TROMETHAMINE 30 MG/ML IJ SOLN
30.0000 mg | Freq: Once | INTRAMUSCULAR | Status: AC
  Administered 2017-05-28: 30 mg via INTRAVENOUS
  Filled 2017-05-28: qty 1

## 2017-05-28 NOTE — ED Notes (Signed)
Will watch Pt. For approx. 30 mins before discharging her.

## 2017-05-28 NOTE — ED Triage Notes (Signed)
Abdominal pain and vaginal bleeding since yesterday. She is 2 weeks late on her menses. She has had 4 home pregnancy test. 2 were negative and 2 were inconclusive.

## 2017-05-28 NOTE — ED Notes (Signed)
No adverse reaction noted to antibiotics given in ED.  Pt discharged home with instructions and two prescriptions.

## 2017-05-28 NOTE — Discharge Instructions (Signed)
Take the medications as prescribed, follow-up with your OB/GYN doctor to be rechecked in the next week

## 2017-05-28 NOTE — ED Provider Notes (Signed)
[facility] EMERGENCY DEPARTMENT Provider Note   CSN: [id number] Arrival date & time: 05/28/17  1036     History   Chief Complaint Chief Complaint  Patient presents with  . Abdominal Pain  . Vaginal Bleeding    HPI Evelyn Lopez is a 36 y.o. female.  HPI Patient presents to the emergency room for evaluation of lower abdominal pain and vaginal bleeding.  Patient states she is about 2 weeks late on her menstrual period.  Yesterday she started having heavy vaginal bleeding.  She also starting having severe pain diffusely throughout her abdomen.  She is noticed blood in her urine and also has been passing clots of blood from her vaginal area.  She denies any diarrhea.  No fever. Past Medical History:  Diagnosis Date  . Bipolar disorder (HCC)   . Chronic back pain   . Ectopic pregnancy    June 2013  . Ovarian cyst   . Panic attacks     There are no active problems to display for this patient.   Past Surgical History:  Procedure Laterality Date  . LAPAROSCOPY  12/29/2011   Procedure: LAPAROSCOPY OPERATIVE;  Surgeon: Tresa Endo A. Ernestina Penna, MD;  Location: [facility];  Service: Gynecology;  Laterality: Bilateral;  Right Salpingectomy    OB History    Gravida Para Term Preterm AB Living   2 0 0 0 0 0   SAB TAB Ectopic Multiple Live Births   0 0 0 0         Home Medications    Prior to Admission medications   Medication Sig Start Date End Date Taking? Authorizing Provider  albuterol (PROVENTIL HFA;VENTOLIN HFA) 108 (90 Base) MCG/ACT inhaler Inhale 1-2 puffs into the lungs every 6 (six) hours as needed for wheezing. Patient not taking: Reported on 10/14/2016 11/12/15   Rolland Porter, MD  ALPRAZolam Prudy Feeler) 1 MG tablet Take 1 mg by mouth 3 (three) times daily as needed for anxiety.  08/11/15   [provider]  benzonatate (TESSALON) 100 MG capsule Take 2 capsules (200 mg total) by mouth 2 (two) times daily as needed for cough. Patient not taking: Reported on 10/14/2016  08/07/16   Barrett Henle, PA-C  doxycycline (VIBRAMYCIN) 100 MG capsule Take 1 capsule (100 mg total) by mouth 2 (two) times daily. 05/28/17   Linwood Dibbles, MD  EPINEPHrine 0.3 mg/0.3 mL IJ SOAJ injection Inject 0.3 mLs (0.3 mg total) into the muscle once. 08/21/15   Raeford Razor, MD  ferrous sulfate 325 (65 FE) MG tablet Take 1 tablet (325 mg total) by mouth daily. 10/14/16   Ward, Chase Picket, PA-C  HYDROcodone-acetaminophen (NORCO/VICODIN) 5-325 MG tablet Take 1-2 tablets by mouth every 4 (four) hours as needed. Patient not taking: Reported on 10/14/2016 07/16/16   Emi Holes, PA-C  ibuprofen (ADVIL,MOTRIN) 800 MG tablet Take 1 tablet (800 mg total) by mouth 3 (three) times daily. 07/16/16   Law, Waylan Boga, PA-C  methocarbamol (ROBAXIN) 500 MG tablet Take 1 tablet (500 mg total) by mouth 2 (two) times daily. Patient not taking: Reported on 10/14/2016 07/16/16   Emi Holes, PA-C  naproxen (NAPROSYN) 500 MG tablet Take 1 tablet (500 mg total) by mouth 2 (two) times daily. 05/28/17   Linwood Dibbles, MD  ondansetron (ZOFRAN ODT) 4 MG disintegrating tablet Take 1 tablet (4 mg total) by mouth every 8 (eight) hours as needed for nausea or vomiting. 10/14/16   Ward, Chase Picket, PA-C  oxymetazoline (AFRIN NASAL SPRAY)  0.05 % nasal spray Place 1 spray into both nostrils 2 (two) times daily. Spray once into each nostril twice daily for up to the next 3 days. Do not use for more than 3 days to prevent rebound rhinorrhea. Patient not taking: Reported on 10/14/2016 08/07/16   Barrett HenleNadeau, Nicole Elizabeth, PA-C  risperiDONE (RISPERDAL) 0.25 MG tablet Take by mouth at bedtime.    [provider]  traZODone (DESYREL) 100 MG tablet Take 200 mg by mouth at bedtime.    [provider]    Family History No family history on file.  Social History Social History   Tobacco Use  . Smoking status: Current Every Day Smoker    Packs/day: 0.25    Types: Cigarettes  . Smokeless tobacco: Never  Used  Substance Use Topics  . Alcohol use: No    Comment: ocassionally   . Drug use: No     Allergies   Rocephin [ceftriaxone sodium in dextrose]   Review of Systems Review of Systems  All other systems reviewed and are negative.    Physical Exam Updated Vital Signs BP 132/80 (BP Location: Right Arm)   Pulse (!) 55   Temp 97.9 F (36.6 C) (Oral)   Resp 18   Ht 1.753 m (5\' 9" )   Wt 106.6 kg (235 lb)   LMP 04/15/2017   SpO2 99%   BMI 34.70 kg/m   Physical Exam  Constitutional: She appears distressed.  HENT:  Head: Normocephalic and atraumatic.  Right Ear: External ear normal.  Left Ear: External ear normal.  Eyes: Conjunctivae are normal. Right eye exhibits no discharge. Left eye exhibits no discharge. No scleral icterus.  Neck: Neck supple. No tracheal deviation present.  Cardiovascular: Normal rate, regular rhythm and intact distal pulses.  Pulmonary/Chest: Effort normal and breath sounds normal. No stridor. No respiratory distress. She has no wheezes. She has no rales.  Abdominal: Soft. Bowel sounds are normal. She exhibits no distension. There is generalized tenderness. There is no rebound and no guarding.  Genitourinary: There is no tenderness or lesion on the right labia. There is no tenderness on the left labia. Uterus is tender. Cervix exhibits motion tenderness. Right adnexum displays tenderness. Left adnexum displays tenderness. There is tenderness in the vagina. No vaginal discharge found.  Genitourinary Comments: No mucopurulent discharge, small amount of blood in the vaginal vault  Musculoskeletal: She exhibits no edema or tenderness.  Neurological: She is alert. She has normal strength. No cranial nerve deficit (no facial droop, extraocular movements intact, no slurred speech) or sensory deficit. She exhibits normal muscle tone. She displays no seizure activity. Coordination normal.  Skin: Skin is warm and dry. No rash noted.  Psychiatric: She has a normal  mood and affect.  Nursing note and vitals reviewed.    ED Treatments / Results  Labs (all labs ordered are listed, but only abnormal results are displayed) Labs Reviewed  WET PREP, GENITAL - Abnormal; Notable for the following components:      Result Value   Clue Cells Wet Prep HPF POC PRESENT (*)    WBC, Wet Prep HPF POC MODERATE (*)    All other components within normal limits  URINALYSIS, ROUTINE W REFLEX MICROSCOPIC - Abnormal; Notable for the following components:   Hgb urine dipstick LARGE (*)    All other components within normal limits  URINALYSIS, MICROSCOPIC (REFLEX) - Abnormal; Notable for the following components:   Bacteria, UA RARE (*)    Squamous Epithelial / LPF 0-5 (*)  All other components within normal limits  COMPREHENSIVE METABOLIC PANEL - Abnormal; Notable for the following components:   Calcium 8.7 (*)    ALT 10 (*)    All other components within normal limits  PREGNANCY, URINE  LIPASE, BLOOD  CBC WITH DIFFERENTIAL/PLATELET  RPR  HIV ANTIBODY (ROUTINE TESTING)  GC/CHLAMYDIA PROBE AMP ([place]    EKG  EKG Interpretation None       Radiology Ct Renal Stone Study  Result Date: 05/28/2017 CLINICAL DATA:  Left flank pain and pelvic pain.  Hematuria. EXAM: CT ABDOMEN AND PELVIS WITHOUT CONTRAST TECHNIQUE: Multidetector CT imaging of the abdomen and pelvis was performed following the standard protocol without IV contrast. COMPARISON:  10/14/2016 FINDINGS: Lower chest: The visualized lung bases are clear. Hepatobiliary: The hepatic dome was incompletely imaged. A 3 mm low-density lesion in the posterior right hepatic lobe is too small to fully characterize. The gallbladder is unremarkable. There is no biliary dilatation. Pancreas: Unremarkable. Spleen: Unremarkable. Adrenals/Urinary Tract: Unremarkable adrenal glands. No evidence of renal mass on this unenhanced examination. Nonobstructing 2 mm calculus in the lower pole of the left  kidney. No ureteral calculi or hydroureteronephrosis. Unremarkable bladder. Stomach/Bowel: The stomach is within normal limits. No bowel dilatation. Scattered left-sided colonic diverticula without evidence of diverticulitis. Unremarkable appendix. Vascular/Lymphatic: Normal caliber of the abdominal aorta. No enlarged lymph nodes. Reproductive: 3.9 x 2.7 cm low-density right ovarian structure is consistent with a cyst. Unremarkable uterus and left ovary. Other: No intraperitoneal free fluid. No abdominal wall mass or hernia. Musculoskeletal: No acute osseous abnormality or suspicious osseous lesion. Mild degenerative spurring at the hips. IMPRESSION: 1. Nonobstructing left renal calculus. 2. 3.9 cm benign-appearing right ovarian cyst. No follow-up recommended. This recommendation follows ACR consensus guidelines: White Paper of the ACR Incidental Findings Committee II on Adnexal Findings. J Am Coll Radiol [phone]. Electronically Signed   By: Sebastian AcheAllen  Grady M.D.   On: 05/28/2017 13:06    Procedures Procedures (including critical care time)  Medications Ordered in ED Medications  sodium chloride 0.9 % bolus 1,000 mL (0 mLs Intravenous Stopped 05/28/17 1331)    And  0.9 %  sodium chloride infusion ( Intravenous Stopped 05/28/17 1531)  HYDROmorphone (DILAUDID) injection 0.5 mg (0.5 mg Intravenous Given 05/28/17 1332)  gentamicin (GARAMYCIN) injection 240 mg (not administered)  azithromycin (ZITHROMAX) tablet 1,000 mg (not administered)  ondansetron (ZOFRAN) injection 4 mg (4 mg Intravenous Given 05/28/17 1155)  azithromycin (ZITHROMAX) tablet 1,000 mg (1,000 mg Oral Given 05/28/17 1436)  ketorolac (TORADOL) 30 MG/ML injection 30 mg (30 mg Intravenous Given 05/28/17 1531)     Initial Impression / Assessment and Plan / ED Course  I have reviewed the triage vital signs and the nursing notes.  Pertinent labs & imaging results that were available during my care of the patient were reviewed by me  and considered in my medical decision making (see chart for details).  Clinical Course as of May 29 1551  Fri May 28, 2017  1314 Patient is still having pain.  [JK]  1347 Patient does have cervical motion tenderness and significant uterine tenderness.  I will cover her for GC and chlamydia.  Patient has a ceftriaxone allergy.  Gentamicin 240 mg IM once is indicated as an alternative.  [JK]    Clinical Course User Index [JK] Linwood DibblesKnapp, Irania Durell, MD    Patient presented with abdominal pain and hematuria.  He was concerned about the possibility ureteral colic.  CT scan was performed.  Patient  does have an incidental kidney stone but no signs of ureteral stone.  Incidental ovarian cyst also noted but I do not think this is related to her pain.  Patient does have a significant amount of uterine tenderness.  I will treat her for cervicitis.  Patient is allergic to ceftriaxone so the alternative is gentamicin and azithromycin.  We will have the patient continue pain medications.  Follow-up with your OB/GYN doctor.  Final Clinical Impressions(s) / ED Diagnoses   Final diagnoses:  Abnormal vaginal bleeding  PID (acute pelvic inflammatory disease)    ED Discharge Orders        Ordered    naproxen (NAPROSYN) 500 MG tablet  2 times daily     05/28/17 1548    doxycycline (VIBRAMYCIN) 100 MG capsule  2 times daily     05/28/17 1551       Linwood Dibbles, MD 05/28/17 1552

## 2017-05-29 LAB — RPR: RPR Ser Ql: NONREACTIVE

## 2017-05-29 LAB — HIV ANTIBODY (ROUTINE TESTING W REFLEX): HIV Screen 4th Generation wRfx: NONREACTIVE

## 2017-05-31 LAB — GC/CHLAMYDIA PROBE AMP (~~LOC~~) NOT AT ARMC
Chlamydia: NEGATIVE
Neisseria Gonorrhea: NEGATIVE

## 2017-08-01 ENCOUNTER — Emergency Department (HOSPITAL_BASED_OUTPATIENT_CLINIC_OR_DEPARTMENT_OTHER)
Admission: EM | Admit: 2017-08-01 | Discharge: 2017-08-01 | Discharge status: Absent without leave | Payer: PRIVATE HEALTH INSURANCE

## 2017-08-01 ENCOUNTER — Encounter (HOSPITAL_BASED_OUTPATIENT_CLINIC_OR_DEPARTMENT_OTHER): Payer: Self-pay | Admitting: Emergency Medicine

## 2017-08-01 ENCOUNTER — Emergency Department (HOSPITAL_BASED_OUTPATIENT_CLINIC_OR_DEPARTMENT_OTHER)
Admission: EM | Admit: 2017-08-01 | Discharge: 2017-08-01 | Disposition: A | Discharge status: Other Acute Inpatient Hospital | Payer: PRIVATE HEALTH INSURANCE | Attending: Emergency Medicine | Admitting: Emergency Medicine

## 2017-08-01 ENCOUNTER — Inpatient Hospital Stay (HOSPITAL_COMMUNITY)
Admission: AD | Admit: 2017-08-01 | Discharge: 2017-08-05 | DRG: 885 | Disposition: A | Discharge status: Home / Self Care | Payer: Federal, State, Local not specified - Other | Source: Intra-hospital | Attending: Psychiatry | Admitting: Psychiatry

## 2017-08-01 ENCOUNTER — Encounter (HOSPITAL_COMMUNITY): Payer: Self-pay

## 2017-08-01 ENCOUNTER — Other Ambulatory Visit: Payer: Self-pay

## 2017-08-01 DIAGNOSIS — F1020 Alcohol dependence, uncomplicated: Secondary | ICD-10-CM | POA: Insufficient documentation

## 2017-08-01 DIAGNOSIS — F3181 Bipolar II disorder: Secondary | ICD-10-CM | POA: Diagnosis not present

## 2017-08-01 DIAGNOSIS — D649 Anemia, unspecified: Secondary | ICD-10-CM | POA: Diagnosis present

## 2017-08-01 DIAGNOSIS — Z79899 Other long term (current) drug therapy: Secondary | ICD-10-CM

## 2017-08-01 DIAGNOSIS — F17210 Nicotine dependence, cigarettes, uncomplicated: Secondary | ICD-10-CM | POA: Diagnosis present

## 2017-08-01 DIAGNOSIS — F332 Major depressive disorder, recurrent severe without psychotic features: Principal | ICD-10-CM | POA: Diagnosis present

## 2017-08-01 DIAGNOSIS — F410 Panic disorder [episodic paroxysmal anxiety] without agoraphobia: Secondary | ICD-10-CM

## 2017-08-01 DIAGNOSIS — R45851 Suicidal ideations: Secondary | ICD-10-CM | POA: Insufficient documentation

## 2017-08-01 DIAGNOSIS — Z3202 Encounter for pregnancy test, result negative: Secondary | ICD-10-CM | POA: Insufficient documentation

## 2017-08-01 DIAGNOSIS — F411 Generalized anxiety disorder: Secondary | ICD-10-CM | POA: Diagnosis present

## 2017-08-01 DIAGNOSIS — F419 Anxiety disorder, unspecified: Secondary | ICD-10-CM | POA: Diagnosis not present

## 2017-08-01 DIAGNOSIS — J45909 Unspecified asthma, uncomplicated: Secondary | ICD-10-CM | POA: Diagnosis present

## 2017-08-01 DIAGNOSIS — F3189 Other bipolar disorder: Secondary | ICD-10-CM

## 2017-08-01 DIAGNOSIS — G4700 Insomnia, unspecified: Secondary | ICD-10-CM | POA: Diagnosis present

## 2017-08-01 DIAGNOSIS — F4310 Post-traumatic stress disorder, unspecified: Secondary | ICD-10-CM | POA: Diagnosis present

## 2017-08-01 DIAGNOSIS — F1990 Other psychoactive substance use, unspecified, uncomplicated: Secondary | ICD-10-CM

## 2017-08-01 DIAGNOSIS — Z62810 Personal history of physical and sexual abuse in childhood: Secondary | ICD-10-CM | POA: Diagnosis present

## 2017-08-01 DIAGNOSIS — F4481 Dissociative identity disorder: Secondary | ICD-10-CM | POA: Diagnosis present

## 2017-08-01 DIAGNOSIS — R000 Tachycardia, unspecified: Secondary | ICD-10-CM | POA: Diagnosis not present

## 2017-08-01 DIAGNOSIS — Z598 Other problems related to housing and economic circumstances: Secondary | ICD-10-CM | POA: Diagnosis not present

## 2017-08-01 HISTORY — DX: Post-traumatic stress disorder, unspecified: F43.10

## 2017-08-01 HISTORY — DX: Dissociative identity disorder: F44.81

## 2017-08-01 LAB — COMPREHENSIVE METABOLIC PANEL
ALT: 8 U/L — ABNORMAL LOW (ref 14–54)
AST: 22 U/L (ref 15–41)
Albumin: 3.7 g/dL (ref 3.5–5.0)
Alkaline Phosphatase: 75 U/L (ref 38–126)
Anion gap: 11 (ref 5–15)
BUN: 16 mg/dL (ref 6–20)
CO2: 24 mmol/L (ref 22–32)
Calcium: 8.3 mg/dL — ABNORMAL LOW (ref 8.9–10.3)
Chloride: 103 mmol/L (ref 101–111)
Creatinine, Ser: 0.8 mg/dL (ref 0.44–1.00)
GFR calc Af Amer: >60 mL/min (ref >60)
GFR calc non Af Amer: >60 mL/min (ref >60)
Glucose, Bld: 93 mg/dL (ref 65–99)
Potassium: 4 mmol/L (ref 3.5–5.1)
Sodium: 138 mmol/L (ref 135–145)
Total Bilirubin: 0.7 mg/dL (ref 0.3–1.2)
Total Protein: 6.9 g/dL (ref 6.5–8.1)

## 2017-08-01 LAB — RAPID URINE DRUG SCREEN, HOSP PERFORMED
Amphetamines: NOT DETECTED
Barbiturates: NOT DETECTED
Benzodiazepines: POSITIVE — AB
Cocaine: NOT DETECTED
Opiates: NOT DETECTED
Tetrahydrocannabinol: POSITIVE — AB

## 2017-08-01 LAB — CBC
HCT: 35.7 % — ABNORMAL LOW (ref 36.0–46.0)
Hemoglobin: 11.3 g/dL — ABNORMAL LOW (ref 12.0–15.0)
MCH: 27.4 pg (ref 26.0–34.0)
MCHC: 31.7 g/dL (ref 30.0–36.0)
MCV: 86.7 fL (ref 78.0–100.0)
Platelets: 250 10*3/uL (ref 150–400)
RBC: 4.12 MIL/uL (ref 3.87–5.11)
RDW: 15.6 % — ABNORMAL HIGH (ref 11.5–15.5)
WBC: 7.7 10*3/uL (ref 4.0–10.5)

## 2017-08-01 LAB — ETHANOL: Alcohol, Ethyl (B): <10 mg/dL (ref <10)

## 2017-08-01 LAB — CBG MONITORING, ED: Glucose-Capillary: 83 mg/dL (ref 65–99)

## 2017-08-01 LAB — SALICYLATE LEVEL: Salicylate Lvl: <7 mg/dL (ref 2.8–30.0)

## 2017-08-01 LAB — PREGNANCY, URINE: Preg Test, Ur: NEGATIVE

## 2017-08-01 LAB — ACETAMINOPHEN LEVEL: Acetaminophen (Tylenol), Serum: <10 ug/mL — ABNORMAL LOW (ref 10–30)

## 2017-08-01 MED ORDER — RISPERIDONE 0.5 MG PO TABS: 0.2500 mg | ORAL_TABLET | Freq: Every day | ORAL | Status: DC

## 2017-08-01 MED ORDER — LAMOTRIGINE 25 MG PO TABS
25.0000 mg | ORAL_TABLET | Freq: Every day | ORAL | Status: DC
  Filled 2017-08-01: qty 1

## 2017-08-01 MED ORDER — HYDROXYZINE HCL 25 MG PO TABS
25.0000 mg | ORAL_TABLET | Freq: Three times a day (TID) | ORAL | Status: DC | PRN
  Administered 2017-08-01: 25 mg via ORAL
  Filled 2017-08-01: qty 1

## 2017-08-01 MED ORDER — NICOTINE POLACRILEX 2 MG MT GUM: 2.0000 mg | CHEWING_GUM | OROMUCOSAL | Status: DC | PRN

## 2017-08-01 MED ORDER — LORAZEPAM 1 MG PO TABS
1.0000 mg | ORAL_TABLET | Freq: Once | ORAL | Status: AC
  Administered 2017-08-01: 1 mg via ORAL
  Filled 2017-08-01: qty 1

## 2017-08-01 MED ORDER — MAGNESIUM HYDROXIDE 400 MG/5ML PO SUSP: 30.0000 mL | Freq: Every day | ORAL | Status: DC | PRN

## 2017-08-01 MED ORDER — ACETAMINOPHEN 325 MG PO TABS
650.0000 mg | ORAL_TABLET | ORAL | Status: DC | PRN
  Administered 2017-08-02 – 2017-08-04 (×5): 650 mg via ORAL
  Filled 2017-08-01 (×5): qty 2

## 2017-08-01 MED ORDER — NICOTINE 21 MG/24HR TD PT24: 21.0000 mg | MEDICATED_PATCH | Freq: Every day | TRANSDERMAL | Status: DC

## 2017-08-01 MED ORDER — ACETAMINOPHEN 325 MG PO TABS: 650.0000 mg | ORAL_TABLET | Freq: Four times a day (QID) | ORAL | Status: DC | PRN

## 2017-08-01 MED ORDER — FERROUS SULFATE 325 (65 FE) MG PO TABS
325.0000 mg | ORAL_TABLET | Freq: Every day | ORAL | Status: DC
  Filled 2017-08-01: qty 1

## 2017-08-01 MED ORDER — ALPRAZOLAM 0.5 MG PO TABS
1.0000 mg | ORAL_TABLET | Freq: Three times a day (TID) | ORAL | Status: DC | PRN
  Administered 2017-08-01: 1 mg via ORAL
  Filled 2017-08-01: qty 2

## 2017-08-01 MED ORDER — NICOTINE 21 MG/24HR TD PT24
21.0000 mg | MEDICATED_PATCH | Freq: Every day | TRANSDERMAL | Status: DC
  Administered 2017-08-02 – 2017-08-05 (×4): 21 mg via TRANSDERMAL
  Filled 2017-08-01 (×7): qty 1

## 2017-08-01 MED ORDER — ACETAMINOPHEN 325 MG PO TABS: 650.0000 mg | ORAL_TABLET | ORAL | Status: DC | PRN

## 2017-08-01 MED ORDER — ONDANSETRON HCL 8 MG PO TABS: 4.0000 mg | ORAL_TABLET | Freq: Three times a day (TID) | ORAL | Status: DC | PRN

## 2017-08-01 MED ORDER — RISPERIDONE 0.25 MG PO TABS
0.2500 mg | ORAL_TABLET | Freq: Every day | ORAL | Status: DC
  Administered 2017-08-01 – 2017-08-02 (×2): 0.25 mg via ORAL
  Filled 2017-08-01 (×4): qty 1

## 2017-08-01 MED ORDER — ACETAMINOPHEN 500 MG PO TABS
ORAL_TABLET | ORAL | Status: AC
  Filled 2017-08-01: qty 2

## 2017-08-01 MED ORDER — TRAZODONE HCL 100 MG PO TABS
200.0000 mg | ORAL_TABLET | Freq: Every day | ORAL | Status: DC
  Filled 2017-08-01: qty 2

## 2017-08-01 MED ORDER — TRAZODONE HCL 100 MG PO TABS
200.0000 mg | ORAL_TABLET | Freq: Every day | ORAL | Status: DC
  Filled 2017-08-01 (×3): qty 2

## 2017-08-01 MED ORDER — FERROUS SULFATE 325 (65 FE) MG PO TABS
325.0000 mg | ORAL_TABLET | Freq: Every day | ORAL | Status: DC
  Administered 2017-08-02 – 2017-08-05 (×4): 325 mg via ORAL
  Filled 2017-08-01 (×2): qty 1
  Filled 2017-08-01: qty 7
  Filled 2017-08-01 (×4): qty 1

## 2017-08-01 MED ORDER — ONDANSETRON HCL 4 MG PO TABS: 4.0000 mg | ORAL_TABLET | Freq: Three times a day (TID) | ORAL | Status: DC | PRN

## 2017-08-01 MED ORDER — ALUM & MAG HYDROXIDE-SIMETH 200-200-20 MG/5ML PO SUSP: 30.0000 mL | ORAL | Status: DC | PRN

## 2017-08-01 MED ORDER — PRAZOSIN HCL 1 MG PO CAPS
1.0000 mg | ORAL_CAPSULE | Freq: Once | ORAL | Status: AC
  Administered 2017-08-01: 1 mg via ORAL
  Filled 2017-08-01 (×2): qty 1

## 2017-08-01 MED ORDER — LAMOTRIGINE 25 MG PO TABS
25.0000 mg | ORAL_TABLET | Freq: Every day | ORAL | Status: DC
  Filled 2017-08-01 (×3): qty 1

## 2017-08-01 NOTE — Tx Team (Addendum)
Initial Treatment Plan 08/01/2017 11:04 PM Evelyn Lopez [id number]    PATIENT STRESSORS: Medication change or noncompliance Substance abuse Other: anxiety, panic attacks with seizure like activity   PATIENT STRENGTHS: Ability for insight Average or above average intelligence General fund of knowledge Supportive family/friends   PATIENT IDENTIFIED PROBLEMS: Depression Anxiety - panic attacks with seizure like acitivity  PTSD  "I want to walk out of here on Wednesday with a clear head"  "I want to feel like the old Evelyn Lopez"  "I can't work, feeling like this, I need disability"             DISCHARGE CRITERIA:  Ability to meet basic life and health needs Improved stabilization in mood, thinking, and/or behavior Safe-care adequate arrangements made Verbal commitment to aftercare and medication compliance  PRELIMINARY DISCHARGE PLAN: Outpatient therapy Return to previous living arrangement  PATIENT/FAMILY INVOLVEMENT: This treatment plan has been presented to and reviewed with the patient, Evelyn Donndrea N Custer. The patient and family have been given the opportunity to ask questions and make suggestions.  Aurora Maskwyman, Kylani Wires E, RN 08/01/2017, 11:04 PM

## 2017-08-01 NOTE — ED Notes (Signed)
Spoke to [place] at staffing office regarding request for sitter. Advised none available.
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
## 2017-08-01 NOTE — ED Notes (Signed)
ED Provider at bedside. 

## 2017-08-01 NOTE — ED Triage Notes (Addendum)
Pt drove self to ED and during check in pt began shaking and stopped answering questions. Pt brought immediately to rm 14, EDP to bedside. Pt able to talk and advise she was checking in due to anxiety. Pt CAO at this time. Pt also advising she has been suicidal for 1 month with thought of cutting her wrist and jumping off of a bridge.

## 2017-08-01 NOTE — Progress Notes (Signed)
Patient ID: Evelyn Lopez, female   DOB: Nov 16, 1980, 37 y.o.   MRN: [id number]  37 [facility]/o ongoing anxiety and panic attacks with seizure like activity. Pt seen by psychiatrist Dr. Ready who recently diagnosed her with PTSD and seizure like activity. Per pt she was started on a medication that starts with a "Cit" to treat these episodes. Reports that she is unable to tell when the "seizure is about to occur but it mostly happens when I have a panic attacks." Pt reports recent insomnia, only sleeping 2 hours a night. Pt reports she also has gained 20 lbs in the past two months due to binge eating. Endorses tobacco and alcohol use only. UDS positive for benzos and THC. ETOH <5. Pt reports she drinks 2-3 beers/night and then monthly will drink 6+ beers. Reports her fiance told her he is worried about her drinking. Pt currently reports she wishes to "get a clear head" and "feel like [name] again." Pt also reports she is interested in getting disability for her anxiety.   Pt currently denies SI/HI and A/V hallucinations. Pt verbally agrees to seek staff if SI/HI or A/VH occurs and to consult with staff before acting on any harmful thoughts.Consents signed, skin/belongings search completed and pt oriented to unit. Pt stable at this time. Pt given the opportunity to express concerns and ask questions. Pt given toiletries. Will continue to monitor.
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
## 2017-08-01 NOTE — ED Notes (Signed)
Pt on with TTS. Pt being monitored via CCTV while with TTS.

## 2017-08-01 NOTE — ED Notes (Signed)
Pt mother [place] SinkRita [phone]

## 2017-08-01 NOTE — ED Notes (Signed)
PT'S CLOTHING PLACED IN PT BELONGINGS BAGS AND PLACED IN THE PROPER CABINETS.  PT'S PHONE, KEYS, AND CARDS PLACED IN A SPECIMEN BAG, SEALED AND GIVEN TO SECURITY TO BE PLACED IN THE SAFE.  CARDS INCLUDE 1 MASTER CARD, TWO VISA CARDS, ncdl, AND INSURANCE CARD.

## 2017-08-01 NOTE — Progress Notes (Signed)
[facility] Bed available at 7:30 p.m. Accepted to the services of Dr. Jama Flavorsobos, MD. Please call report prior to transfer at [phone]. Rosey BathKelly Darolyn Double, RN

## 2017-08-01 NOTE — ED Notes (Signed)
Pt wanded by security. 

## 2017-08-02 DIAGNOSIS — Z818 Family history of other mental and behavioral disorders: Secondary | ICD-10-CM

## 2017-08-02 DIAGNOSIS — R45851 Suicidal ideations: Secondary | ICD-10-CM

## 2017-08-02 DIAGNOSIS — F3189 Other bipolar disorder: Secondary | ICD-10-CM

## 2017-08-02 DIAGNOSIS — F17210 Nicotine dependence, cigarettes, uncomplicated: Secondary | ICD-10-CM

## 2017-08-02 DIAGNOSIS — R000 Tachycardia, unspecified: Secondary | ICD-10-CM

## 2017-08-02 DIAGNOSIS — F1990 Other psychoactive substance use, unspecified, uncomplicated: Secondary | ICD-10-CM

## 2017-08-02 DIAGNOSIS — F410 Panic disorder [episodic paroxysmal anxiety] without agoraphobia: Secondary | ICD-10-CM

## 2017-08-02 DIAGNOSIS — Z62810 Personal history of physical and sexual abuse in childhood: Secondary | ICD-10-CM

## 2017-08-02 DIAGNOSIS — Z598 Other problems related to housing and economic circumstances: Secondary | ICD-10-CM

## 2017-08-02 MED ORDER — PRAZOSIN HCL 1 MG PO CAPS
1.0000 mg | ORAL_CAPSULE | Freq: Every day | ORAL | Status: DC
  Administered 2017-08-02 – 2017-08-04 (×3): 1 mg via ORAL
  Filled 2017-08-02: qty 1
  Filled 2017-08-02: qty 7
  Filled 2017-08-02 (×4): qty 1

## 2017-08-02 MED ORDER — LITHIUM CARBONATE 300 MG PO CAPS
900.0000 mg | ORAL_CAPSULE | Freq: Every day | ORAL | Status: DC
  Administered 2017-08-02 – 2017-08-04 (×3): 900 mg via ORAL
  Filled 2017-08-02 (×3): qty 3
  Filled 2017-08-02: qty 21
  Filled 2017-08-02: qty 3

## 2017-08-02 MED ORDER — CHLORDIAZEPOXIDE HCL 25 MG PO CAPS
25.0000 mg | ORAL_CAPSULE | Freq: Once | ORAL | Status: AC
  Administered 2017-08-02: 25 mg via ORAL
  Filled 2017-08-02: qty 1

## 2017-08-02 MED ORDER — CHLORDIAZEPOXIDE HCL 25 MG PO CAPS
25.0000 mg | ORAL_CAPSULE | Freq: Four times a day (QID) | ORAL | Status: DC | PRN
  Administered 2017-08-02: 25 mg via ORAL
  Filled 2017-08-02: qty 1

## 2017-08-02 MED ORDER — LAMOTRIGINE 25 MG PO TABS
25.0000 mg | ORAL_TABLET | Freq: Two times a day (BID) | ORAL | Status: DC
  Administered 2017-08-02 – 2017-08-04 (×4): 25 mg via ORAL
  Filled 2017-08-02 (×6): qty 1

## 2017-08-02 NOTE — Progress Notes (Signed)
Recreation Therapy Notes  Date: 08/01/17 Time: 0930 Location: 300 Hall Dayroom  Group Topic: Stress Management  Goal Area(s) Addresses:  Patient will verbalize importance of using healthy stress management.  Patient will identify positive emotions associated with healthy stress management.   Intervention: Stress Management  Activity :  [employer]ountain Meditation.  LRT played a meditation on the power of mountains to be resilient in the face of change.  Patients were to follow along with the meditation as it played.  Education: Stress Management, Discharge Planning.   Education Outcome: Acknowledges edcuation/In group clarification offered/Needs additional education  Clinical Observations/Feedback: Pt did not attend group.     Caroll RancherMarjette Parneet Glantz, LRT/CTRS         Caroll RancherLindsay, Calieb Lichtman A 08/02/2017 12:08 PM

## 2017-08-02 NOTE — Progress Notes (Signed)
Adult Psychoeducational Group Note  Date:  08/02/2017 Time:  2:21 PM  Group Topic/Focus:  Wellness Toolbox:   The focus of this group is to discuss various aspects of wellness, balancing those aspects and exploring ways to increase the ability to experience wellness.  Patients will create a wellness toolbox for use upon discharge.  Participation Level:  Active  Participation Quality:  Appropriate  Affect:  Appropriate  Cognitive:  Alert and Appropriate  Insight: Appropriate, Good and Improving  Engagement in Group:  Engaged  Modes of Intervention:  Activity and Discussion  Additional Comments:  Pt did participate in all group activities and discussions today.  Linnet Bottari R Aradhana Gin 08/02/2017, 2:21 PM

## 2017-08-02 NOTE — [facility] Group Notes (Addendum)
Group was facilitated and Clinical research associatewriter discussed rules and regulations of the unit, explained the schedule for the rest of the evening(phone times, med times, vitals and breakfast in the am.) Writer ask each patient how their day was on a scale of 1-10. Pt stated her day was an 8. Pt stated she was out of the room more and thinking about things and talking with others and seeing family. Writer spoke with patients about taking care of themselves and replacing negative thoughts with positive thoughts. Writer encourage patients to write down their accomplishments and celebrate them as they complete each one no matter how big or small. Writer spoke about setting boundaries and not feeling guilty for doing what is best for them.
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
## 2017-08-02 NOTE — [facility] Suicide Risk Assessment (Signed)
[facility] INPATIENT:  Family/Significant Other Suicide Prevention Education  Suicide Prevention Education:  Education Completed; Evelyn PollackRita Lopez (mother, [phone]) has been identified by the patient as the family member/significant other with whom the patient will be residing, and identified as the person(s) who will aid the patient in the event of a mental health crisis (suicidal ideations/suicide attempt).  With written consent from the patient, the family member/significant other has been provided the following suicide prevention education, prior to the and/or following the discharge of the patient.  The suicide prevention education provided includes the following:  Suicide risk factors  Suicide prevention and interventions  National Suicide Hotline telephone number  [facility] assessment telephone number  [facility] 911  [facility] and/or [facility]idential [facility] Crisis [facility] telephone number  Request made of family/significant other to:  Remove weapons (e.g., guns, rifles, knives), all items previously/currently identified as safety concern.    Remove drugs/medications (over-the-counter, prescriptions, illicit drugs), all items previously/currently identified as a safety concern.  The family member/significant other verbalizes understanding of the suicide prevention education information provided.  The family member/significant other agrees to remove the items of safety concern listed above.  Evelyn SarahJolan E Daisja Lopez 08/02/2017, 12:49 PM

## 2017-08-02 NOTE — [facility] Suicide Risk Assessment (Signed)
[facility] Admission Suicide Risk Assessment   Nursing information obtained from:    Demographic factors:    Current Mental Status:    Loss Factors:    Historical Factors:    Risk Reduction Factors:     Total Time spent with patient: 45 minutes Principal Problem: Other bipolar disorder (HCC) Diagnosis:   Patient Active Problem List   Diagnosis Date Noted  . Panic disorder [F41.0] 08/02/2017  . Substance use disorder [F19.90] 08/02/2017  . Other bipolar disorder [facility]) [F31.89] 08/01/2017   Subjective Data:  37 y.o Caucasian female, lives with her fiancee and her parents, employed. Background history of Early life trauma, anxiety attacks and PTSD. Presented to the ER via emergency services. Reported to be trembling and very anxious at presentation. She was in a panic state. Reported seeing shadows of a man around the corner of her eyes. She had thoughts of slitting her wrist and thoughts of jumping off a height. Patient reports financial stressors. Routine labs significant for anemia. ECG shows sinus tachycardia. QTc 484. Toxicology is negative. UDS is positive for THC and Benzodiazepines. BAL<10 mg/dl. No past suicidal behavior, no family history of suicide, no evidence of psychosis. No evidence of mania. No cognitive impairment. No access to weapons. She is cooperative with care. She has agreed to treatment recommendations. She has agreed to communicate suicidal thoughts to staff if the thoughts becomes overwhelming.     Continued Clinical Symptoms:  Alcohol Use Disorder Identification Test Final Score (AUDIT): 15 The "Alcohol Use Disorders Identification Test", Guidelines for Use in Primary Care, Second Edition.  World Science writer [facility]). Score between 0-7:  no or low risk or alcohol related problems. Score between 8-15:  moderate risk of alcohol related problems. Score between 16-19:  high risk of alcohol related problems. Score 20 or above:  warrants further diagnostic evaluation for  alcohol dependence and treatment.   CLINICAL FACTORS:   Panic Attacks Bipolar Disorder:   Mixed State Alcohol/Substance Abuse/Dependencies   Musculoskeletal: Strength & Muscle Tone: within normal limits Gait & Station: normal Patient leans: N/A  Psychiatric Specialty Exam: Physical Exam  ROS  Blood pressure 122/79, pulse (!) 105, temperature 98.1 F (36.7 C), temperature source Oral, resp. rate 18, height 5\' 9"  (1.753 m), weight 111.1 kg (245 lb), last menstrual period 07/17/2017, SpO2 98 %.Body mass index is 36.18 kg/m.  General Appearance: As in H&P  Eye Contact:    Speech:    Volume:    Mood:    Affect:    Thought Process:    Orientation:  As in H&P  Thought Content:    Suicidal Thoughts:    Homicidal Thoughts:    Memory:    Judgement:    Insight:    Psychomotor Activity:    Concentration:  As in H&P  Recall:    Fund of Knowledge:    Language:    Akathisia:    Handed:    AIMS (if indicated):     Assets:    ADL's:    Cognition: As in H&P  Sleep:  Number of Hours: 6.5      COGNITIVE FEATURES THAT CONTRIBUTE TO RISK:  None    SUICIDE RISK:   Minimal: No identifiable suicidal ideation.  Patients presenting with no risk factors but with morbid ruminations; may be classified as minimal risk based on the severity of the depressive symptoms  PLAN OF CARE:  As in H&P  I certify that inpatient services furnished can reasonably be expected to improve the  patient's condition.   Georgiann CockerVincent A Izediuno, MD 08/02/2017, 12:28 PM

## 2017-08-02 NOTE — Progress Notes (Signed)
D: Pt was in the dayroom upon initial approach.  Pt presents with anxious, depressed affect and mood.  She reports her day has been "good" and her goal was "being in more of a positive space."  She reports she met her goal and she has been "laughing, participating, feeling pretty good."  Pt denies SI/HI, denies hallucinations, denies pain.  She reports having a "good, happy visit" with her mother and fiance.  Pt has been visible in milieu interacting with peers and staff appropriately.  Pt attended evening group.    A: Introduced self to pt.  Actively listened to pt and offered support and encouragement. Medications offered per order.  Pt reports Trazodone is not helpful.  She reports having "flashbacks and nightmares" at night.  She reports "whatever they gave me last night helped a lot."  On-site provider notified and minipress 1 mg QHS was ordered and administered.  PRN medication administered for anxiety.  Q15 minute safety checks maintained.  R: Pt is safe on the unit.  Pt is compliant with medications except for Trazodone, which she refused.  Pt verbally contracts for safety.  Will continue to monitor and assess.

## 2017-08-02 NOTE — Progress Notes (Signed)
D: Patient came up to medication and expressed her concern about her medication.  She states, "I take xanax 4 times a day."  Informed patient that I would relay that information to the MD.  MD ordered librium 25 mg one time dose at 1200.  She rates her depression as a 4; denies any hopelessness or thoughts of self harm.  She rates her anxiety as a 5.  Her goal today is "to be ale to get through a day with positivity and no demons attacks (episodes)."  She is sleeping and eating well; her energy level is normal and her concentration is good.  Patient wants to get "rid of my panic attacks. I feel like there is a demon is me.  It's becoming an issue with me, my fiance and family and my job."  Patient is pleasant and is observed interacting well with her peers.  She is attending groups and participating.  A: Continue to monitor medication management and MD orders.  Safety checks continued every 15 minutes per protocol.  Offer support and encouragement as needed.  R: Patient is receptive to staff; her behavior is appropriate.

## 2017-08-02 NOTE — Tx Team (Signed)
Interdisciplinary Treatment and Diagnostic Plan Update  08/02/2017 Time of Session: 0936 Evelyn Lopez MRN: [id number]  Principal Diagnosis: Other bipolar disorder [facility])  Secondary Diagnoses: Principal Problem:   Other bipolar disorder (HCC) Active Problems:   Panic disorder   Substance use disorder   Current Medications:  Current Facility-Administered Medications  Medication Dose Route Frequency Provider Last Rate Last Dose  . acetaminophen (TYLENOL) tablet 650 mg  650 mg Oral Q4H PRN Okonkwo, Justina A, NP      . alum & mag hydroxide-simeth (MAALOX/MYLANTA) 200-200-20 MG/5ML suspension 30 mL  30 mL Oral Q4H PRN Okonkwo, Justina A, NP      . chlordiazePOXIDE (LIBRIUM) capsule 25 mg  25 mg Oral QID PRN Izediuno, Vincent A, MD      . ferrous sulfate tablet 325 mg  325 mg Oral Daily Okonkwo, Justina A, NP   325 mg at 08/02/17 0803  . hydrOXYzine (ATARAX/VISTARIL) tablet 25 mg  25 mg Oral TID PRN Ferne Reus A, NP   25 mg at 08/01/17 2159  . lamoTRIgine (LAMICTAL) tablet 25 mg  25 mg Oral BID Izediuno, Vincent A, MD      . lithium carbonate capsule 900 mg  900 mg Oral QHS Izediuno, Vincent A, MD      . magnesium hydroxide (MILK OF MAGNESIA) suspension 30 mL  30 mL Oral Daily PRN Okonkwo, Justina A, NP      . nicotine (NICODERM CQ - dosed in mg/24 hours) patch 21 mg  21 mg Transdermal Daily Okonkwo, Justina A, NP   21 mg at 08/02/17 0805  . nicotine polacrilex (NICORETTE) gum 2 mg  2 mg Oral PRN Nira Conn A, NP      . ondansetron (ZOFRAN) tablet 4 mg  4 mg Oral Q8H PRN Okonkwo, Justina A, NP      . risperiDONE (RISPERDAL) tablet 0.25 mg  0.25 mg Oral QHS Okonkwo, Justina A, NP   0.25 mg at 08/01/17 2159  . traZODone (DESYREL) tablet 200 mg  200 mg Oral QHS Okonkwo, Justina A, NP       PTA Medications: Medications Prior to Admission  Medication Sig Dispense Refill Last Dose  . albuterol (PROVENTIL HFA;VENTOLIN HFA) 108 (90 Base) MCG/ACT inhaler Inhale 1-2 puffs into the lungs  every 6 (six) hours as needed for wheezing. 1 Inhaler 0 Past Week at Unknown time  . ALPRAZolam (XANAX) 1 MG tablet Take 1 mg by mouth 3 (three) times daily as needed for anxiety.   1 Past Week at Unknown time  . ferrous sulfate 325 (65 FE) MG tablet Take 1 tablet (325 mg total) by mouth daily. 30 tablet 0 Past Week at Unknown time  . ibuprofen (ADVIL,MOTRIN) 800 MG tablet Take 1 tablet (800 mg total) by mouth 3 (three) times daily. 21 tablet 0 07/31/2017 at Unknown time  . lamoTRIgine (LAMICTAL) 25 MG tablet Take 25 mg by mouth daily.   08/01/2017 at Unknown time  . risperiDONE (RISPERDAL) 0.25 MG tablet Take by mouth at bedtime.   08/01/2017 at Unknown time  . benzonatate (TESSALON) 100 MG capsule Take 2 capsules (200 mg total) by mouth 2 (two) times daily as needed for cough. (Patient not taking: Reported on 10/14/2016) 20 capsule 0 Not Taking at Unknown time  . doxycycline (VIBRAMYCIN) 100 MG capsule Take 1 capsule (100 mg total) by mouth 2 (two) times daily. 20 capsule 0   . EPINEPHrine 0.3 mg/0.3 mL IJ SOAJ injection Inject 0.3 mLs (0.3 mg total) into the  muscle once. 2 Device 0 unk  . HYDROcodone-acetaminophen (NORCO/VICODIN) 5-325 MG tablet Take 1-2 tablets by mouth every 4 (four) hours as needed. (Patient not taking: Reported on 10/14/2016) 10 tablet 0 Not Taking at Unknown time  . methocarbamol (ROBAXIN) 500 MG tablet Take 1 tablet (500 mg total) by mouth 2 (two) times daily. (Patient not taking: Reported on 10/14/2016) 20 tablet 0 Not Taking at Unknown time  . naproxen (NAPROSYN) 500 MG tablet Take 1 tablet (500 mg total) by mouth 2 (two) times daily. 30 tablet 0   . ondansetron (ZOFRAN ODT) 4 MG disintegrating tablet Take 1 tablet (4 mg total) by mouth every 8 (eight) hours as needed for nausea or vomiting. 10 tablet 0   . oxymetazoline (AFRIN NASAL SPRAY) 0.05 % nasal spray Place 1 spray into both nostrils 2 (two) times daily. Spray once into each nostril twice daily for up to the next 3 days. Do  not use for more than 3 days to prevent rebound rhinorrhea. (Patient not taking: Reported on 10/14/2016) 30 mL 0 Not Taking at Unknown time  . traZODone (DESYREL) 100 MG tablet Take 200 mg by mouth at bedtime.   10/13/2016 at Unknown time    Patient Stressors: Medication change or noncompliance Substance abuse Other: anxiety, panic attacks with seizure like activity  Patient Strengths: Ability for insight Average or above average intelligence General fund of knowledge Supportive family/friends  Treatment Modalities: Medication Management, Group therapy, Case management,  1 to 1 session with clinician, Psychoeducation, Recreational therapy.   Physician Treatment Plan for Primary Diagnosis: Other bipolar disorder (HCC) Long Term Goal(s): Improvement in symptoms so as ready for discharge Improvement in symptoms so as ready for discharge   Short Term Goals: Ability to identify changes in lifestyle to reduce recurrence of condition will improve Ability to verbalize feelings will improve Ability to disclose and discuss suicidal ideas Ability to demonstrate self-control will improve Ability to identify and develop effective coping behaviors will improve Ability to maintain clinical measurements within normal limits will improve Compliance with prescribed medications will improve Ability to identify triggers associated with substance abuse/mental health issues will improve Ability to identify changes in lifestyle to reduce recurrence of condition will improve Ability to verbalize feelings will improve Ability to disclose and discuss suicidal ideas Ability to demonstrate self-control will improve Ability to identify and develop effective coping behaviors will improve Ability to maintain clinical measurements within normal limits will improve Compliance with prescribed medications will improve Ability to identify triggers associated with substance abuse/mental health issues will  improve  Medication Management: Evaluate patient's response, side effects, and tolerance of medication regimen.  Therapeutic Interventions: 1 to 1 sessions, Unit Group sessions and Medication administration.  Evaluation of Outcomes: Progressing  Physician Treatment Plan for Secondary Diagnosis: Principal Problem:   Other bipolar disorder (HCC) Active Problems:   Panic disorder   Substance use disorder  Long Term Goal(s): Improvement in symptoms so as ready for discharge Improvement in symptoms so as ready for discharge   Short Term Goals: Ability to identify changes in lifestyle to reduce recurrence of condition will improve Ability to verbalize feelings will improve Ability to disclose and discuss suicidal ideas Ability to demonstrate self-control will improve Ability to identify and develop effective coping behaviors will improve Ability to maintain clinical measurements within normal limits will improve Compliance with prescribed medications will improve Ability to identify triggers associated with substance abuse/mental health issues will improve Ability to identify changes in lifestyle to reduce recurrence of  condition will improve Ability to verbalize feelings will improve Ability to disclose and discuss suicidal ideas Ability to demonstrate self-control will improve Ability to identify and develop effective coping behaviors will improve Ability to maintain clinical measurements within normal limits will improve Compliance with prescribed medications will improve Ability to identify triggers associated with substance abuse/mental health issues will improve     Medication Management: Evaluate patient's response, side effects, and tolerance of medication regimen.  Therapeutic Interventions: 1 to 1 sessions, Unit Group sessions and Medication administration.  Evaluation of Outcomes: Progressing   RN Treatment Plan for Primary Diagnosis: Other bipolar disorder (HCC) Long  Term Goal(s): Knowledge of disease and therapeutic regimen to maintain health will improve  Short Term Goals: Ability to identify and develop effective coping behaviors will improve and Compliance with prescribed medications will improve  Medication Management: RN will administer medications as ordered by provider, will assess and evaluate patient's response and provide education to patient for prescribed medication. RN will report any adverse and/or side effects to prescribing provider.  Therapeutic Interventions: 1 on 1 counseling sessions, Psychoeducation, Medication administration, Evaluate responses to treatment, Monitor vital signs and CBGs as ordered, Perform/monitor CIWA, COWS, AIMS and Fall Risk screenings as ordered, Perform wound care treatments as ordered.  Evaluation of Outcomes: Progressing   LCSW Treatment Plan for Primary Diagnosis: Other bipolar disorder (HCC) Long Term Goal(s): Safe transition to appropriate next level of care at discharge, Engage patient in therapeutic group addressing interpersonal concerns.  Short Term Goals: Engage patient in aftercare planning with referrals and resources, Increase social support and Increase skills for wellness and recovery  Therapeutic Interventions: Assess for all discharge needs, 1 to 1 time with Social worker, Explore available resources and support systems, Assess for adequacy in community support network, Educate family and significant other(s) on suicide prevention, Complete Psychosocial Assessment, Interpersonal group therapy.  Evaluation of Outcomes: Progressing   Progress in Treatment: Attending groups: Yes. Participating in groups: Yes. Taking medication as prescribed: Yes. Toleration medication: Yes. Family/Significant other contact made: No, will contact:  mother Patient understands diagnosis: Yes. Discussing patient identified problems/goals with staff: Yes. Medical problems stabilized or resolved: Yes. Denies  suicidal/homicidal ideation: Yes. Issues/concerns per patient self-inventory: No. Other: none  New problem(s) identified: No, Describe:  none  New Short Term/Long Term Goal(s):Pt goal: "get rid of my panic attacks."  Discharge Plan or Barriers: outpt services with [facility]  Reason for Continuation of Hospitalization: Depression Medication stabilization  Estimated Length of Stay: 3-5 days.  Attendees: Patient:Evelyn Lopez 08/02/2017   Physician: Dr. Jackquline Berlin 08/02/2017   Nursing: Joslyn Devon, RN 08/02/2017  RN Care Manager: 08/02/2017   Social Worker: Daleen Squibb 08/02/2017   Recreational Therapist:  08/02/2017   Other:  08/02/2017   Other:  08/02/2017   Other: 08/02/2017        Scribe for Treatment Team: Lorri Frederick, LCSW 08/02/2017 2:21 PM
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
## 2017-08-02 NOTE — [facility] Group Notes (Signed)
[facility]  Date/Time: 08/02/17, 1315  Type of Therapy and Topic:  Group Therapy:  Overcoming Obstacles  Participation Level:  actove  Description of Group:    In this group patients will be encouraged to explore what they see as obstacles to their own wellness and recovery. They will be guided to discuss their thoughts, feelings, and behaviors related to these obstacles. The group will process together ways to Acord with barriers, with attention given to specific choices patients can make. Each patient will be challenged to identify changes they are motivated to make in order to overcome their obstacles. This group will be process-oriented, with patients participating in exploration of their own experiences as well as giving and receiving support and challenge from other group members.  Therapeutic Goals: 1. Patient will identify personal and current obstacles as they relate to admission. 2. Patient will identify barriers that currently interfere with their wellness or overcoming obstacles.  3. Patient will identify feelings, thought process and behaviors related to these barriers. 4. Patient will identify two changes they are willing to make to overcome these obstacles:    Summary of Patient Progress: Pt identified her mental health diagnosis as the main obstacle in her life currently.  Pt was attentive in group and participated in the group discussion about ways to overcome obstacles in life.      Therapeutic Modalities:   Cognitive Behavioral Therapy Solution Focused Therapy Motivational Interviewing Relapse Prevention Therapy  Daleen SquibbGreg Yitzchak Kothari, LCSW

## 2017-08-03 DIAGNOSIS — F39 Unspecified mood [affective] disorder: Secondary | ICD-10-CM

## 2017-08-03 DIAGNOSIS — F419 Anxiety disorder, unspecified: Secondary | ICD-10-CM

## 2017-08-03 DIAGNOSIS — G4700 Insomnia, unspecified: Secondary | ICD-10-CM

## 2017-08-03 MED ORDER — HYDROXYZINE HCL 25 MG PO TABS
25.0000 mg | ORAL_TABLET | Freq: Four times a day (QID) | ORAL | Status: DC | PRN
  Administered 2017-08-03 – 2017-08-05 (×4): 25 mg via ORAL
  Filled 2017-08-03: qty 1
  Filled 2017-08-03: qty 10
  Filled 2017-08-03 (×3): qty 1

## 2017-08-03 MED ORDER — CHLORDIAZEPOXIDE HCL 25 MG PO CAPS
25.0000 mg | ORAL_CAPSULE | Freq: Four times a day (QID) | ORAL | Status: DC | PRN
  Administered 2017-08-04: 25 mg via ORAL
  Filled 2017-08-03: qty 1

## 2017-08-03 MED ORDER — RISPERIDONE 0.5 MG PO TABS
0.5000 mg | ORAL_TABLET | Freq: Every day | ORAL | Status: DC
  Administered 2017-08-03: 0.5 mg via ORAL
  Filled 2017-08-03 (×2): qty 1

## 2017-08-03 NOTE — Progress Notes (Signed)
Adult Psychoeducational Group Note  Date:  08/03/2017 Time:  1615 Group Topic/Focus:  Overcoming Stress:   The focus of this group is to define stress and help patients assess their triggers.  Participation Level:  Active  Participation Quality:  Appropriate  Affect:  Appropriate  Cognitive:  Appropriate  Insight: Appropriate  Engagement in Group:  Engaged  Modes of Intervention:  Discussion, Education and Support  Additional Comments:  Ground focus on grounding exercises   Gwenevere Ghazili, Tomara Youngberg [name] 08/03/2017, 4:55 PM

## 2017-08-03 NOTE — Progress Notes (Signed)
[facility] MD Progress Note  08/03/2017 11:00 AM Evelyn Lopez  MRN:  [id number] Subjective:  Patient reports she is feeling better . At this time she is future oriented, and states she is hopeful for discharge soon.  Denies suicidal ideations . Denies medication side effects. Objective : I have discussed case with treatment team and have met with patient. Patient is a 37 year old female , lives with fiance and parents. Reports long history of Mood Disorder, and states she has been told she has Bipolar Disorder, with frequent , significant mood swings and mood lability, states she frequently has significant mood variations within a day. Also reports history of PTSD, and states she has night time flashbacks, insomnia and nightmares. At this time reports improving mood , denies suicidal ideations , denies current psychotic symptoms and does not appear internally preoccupied ( States that she feels recent psychotic symptoms ( seeing a human figure out of the corner of her eyes) were  related to PTSD, flashbacks). Denies medication side effects, except for Trazodone . States Trazodone causes her to feel jittery , overmedicated and wants to stop this medication. Reports Vistaril PRNs more effective for insomnia than Trazodone. Currently on Li, which is a new trial -denies side effects thus far .  States she has a strong social support system and is looking forward to being visited by family this evening . Visible on unit , going to groups.  Principal Problem: Other bipolar disorder ([place]) Diagnosis:   Patient Active Problem List   Diagnosis Date Noted  . Panic disorder [F41.0] 08/02/2017  . Substance use disorder [F19.90] 08/02/2017  . Other bipolar disorder ([place]) [F31.89] 08/01/2017   Total Time spent with patient: 20 minutes  Past Medical History:  Past Medical History:  Diagnosis Date  . Bipolar disorder ([place])   . Chronic back pain   . Ectopic pregnancy    June 2013  . 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 Ovarian cyst   . Panic attacks   . PTSD (post-traumatic stress disorder)     Past Surgical History:  Procedure Laterality Date  . LAPAROSCOPY  12/29/2011   Procedure: LAPAROSCOPY OPERATIVE;  Surgeon: Claiborne Billings A. Pamala Hurry, MD;  Location: [facility];  Service: Gynecology;  Laterality: Bilateral;  Right Salpingectomy   Family History: History reviewed. No pertinent family history. Social History:  Social History   Substance and Sexual Activity  Alcohol Use No   Comment: ocassionally      Social History   Substance and Sexual Activity  Drug Use No    Social History   Socioeconomic History  . Marital status: Single    Spouse name: None  . Number of children: None  . Years of education: None  . Highest education level: None  Social Needs  . Financial resource strain: None  . Food insecurity - worry: None  . Food insecurity - inability: None  . Transportation needs - medical: None  . Transportation needs - non-medical: None  Occupational History  . None  Tobacco Use  . Smoking status: Current Every Day Smoker    Packs/day: 0.50    Types: Cigarettes  . Smokeless tobacco: Never Used  Substance and Sexual Activity  . Alcohol use: No    Comment: ocassionally   . Drug use: No  . Sexual activity: Yes    Birth control/protection: None  Other Topics Concern  . None  Social History Narrative  . None   Additional Social History:   Sleep: fair- improving  Appetite:  Good  Current Medications: Current Facility-Administered Medications  Medication Dose Route Frequency Provider Last Rate Last Dose  . acetaminophen (TYLENOL) tablet 650 mg  650 mg Oral Q4H PRN Okonkwo, Justina A, NP   650 mg at 08/03/17 0619  . alum & mag hydroxide-simeth (MAALOX/MYLANTA) 200-200-20 MG/5ML suspension 30 mL  30 mL Oral Q4H PRN Okonkwo, Justina A, NP      . chlordiazePOXIDE (LIBRIUM) capsule 25 mg  25 mg Oral QID PRN Artist Beach, MD   25 mg at 08/02/17 2213  . ferrous sulfate  tablet 325 mg  325 mg Oral Daily Okonkwo, Justina A, NP   325 mg at 08/03/17 0747  . hydrOXYzine (ATARAX/VISTARIL) tablet 25 mg  25 mg Oral Q6H PRN Cobos, Myer Peer, MD      . lamoTRIgine (LAMICTAL) tablet 25 mg  25 mg Oral BID Artist Beach, MD   25 mg at 08/03/17 0747  . lithium carbonate capsule 900 mg  900 mg Oral QHS Artist Beach, MD   900 mg at 08/02/17 2213  . magnesium hydroxide (MILK OF MAGNESIA) suspension 30 mL  30 mL Oral Daily PRN Okonkwo, Justina A, NP      . nicotine (NICODERM CQ - dosed in mg/24 hours) patch 21 mg  21 mg Transdermal Daily Okonkwo, Justina A, NP   21 mg at 08/03/17 0747  . nicotine polacrilex (NICORETTE) gum 2 mg  2 mg Oral PRN Lindon Romp A, NP      . ondansetron (ZOFRAN) tablet 4 mg  4 mg Oral Q8H PRN Okonkwo, Justina A, NP      . prazosin (MINIPRESS) capsule 1 mg  1 mg Oral QHS Patriciaann Clan E, PA-C   1 mg at 08/02/17 2221  . risperiDONE (RISPERDAL) tablet 0.5 mg  0.5 mg Oral QHS Cobos, Myer Peer, MD        Lab Results: No results found for this or any previous visit (from the past 48 hour(s)).  Blood Alcohol level:  Lab Results  Component Value Date   ETH <10 67/20/9470    Metabolic Disorder Labs: No results found for: HGBA1C, MPG No results found for: PROLACTIN No results found for: CHOL, TRIG, HDL, CHOLHDL, VLDL, LDLCALC  Physical Findings: AIMS: Facial and Oral Movements Muscles of Facial Expression: None, normal Lips and Perioral Area: None, normal Jaw: None, normal Tongue: None, normal,Extremity Movements Upper (arms, wrists, hands, fingers): None, normal Lower (legs, knees, ankles, toes): None, normal, Trunk Movements Neck, shoulders, hips: None, normal, Overall Severity Severity of abnormal movements (highest score from questions above): None, normal Incapacitation due to abnormal movements: None, normal Patient's awareness of abnormal movements (rate only patient's report): No Awareness, Dental Status Current problems  with teeth and/or dentures?: No Does patient usually wear dentures?: No  CIWA:  CIWA-Ar Total: 6 COWS:  COWS Total Score: 4  Musculoskeletal: Strength & Muscle Tone: within normal limits- no tremors, no diaphoresis, no acute distress or psychomotor restlessness  Gait & Station: normal Patient leans: N/A  Psychiatric Specialty Exam: Physical Exam  ROS no chest pain, no shortness of breath, no vomiting   Blood pressure 130/90, pulse (!) 103, temperature 98.1 F (36.7 C), temperature source Oral, resp. rate 18, height '5\' 9"'$  (1.753 m), weight 111.1 kg (245 lb), last menstrual period 07/17/2017, SpO2 98 %.Body mass index is 36.18 kg/m.  General Appearance: Fairly Groomed  Eye Contact:  Good  Speech:  Normal Rate  Volume:  Normal  Mood:  reports she  is feeling better  Affect:  reactive, smiles at times appropriately   Thought Process:  Linear and Descriptions of Associations: Intact  Orientation:  Other:  fully alert and attentive   Thought Content:  denies hallucinations, no delusions, not internally preoccupied at this time  Suicidal Thoughts:  denies suicidal or self injurious ideations , contracts for safety  Homicidal Thoughts:  No denies homicidal or violent ideations  Memory:  recent and remote grossly intact   Judgement:  Other:  improving   Insight:  improving   Psychomotor Activity:  Normal- no tremors, no psychomotor agitation noted   Concentration:  Concentration: Good and Attention Span: Good  Recall:  Good  Fund of Knowledge:  Good  Language:  Good  Akathisia:  Negative  Handed:  Right  AIMS (if indicated):     Assets:  Communication Skills Desire for Improvement Resilience  ADL's:  Intact  Cognition:  WNL  Sleep:  Number of Hours: 6.5   Assessment - patient reports feeling better than she did prior to admission, and is hoping for discharge soon- currently reports improved mood and denies SI. Denies psychotic symptoms. Reports prior diagnosis of Bipolar Disorder  and endorses a  long history of mood disorder, mood instability. Also endorses history of PTSD symptoms . States nightmares have subsided ( on Minipress)  Thus far tolerating Lithium ( new medication ) well, no side effects. Requests to stop Trazodone as poorly tolerated .  Treatment Plan Summary: Daily contact with patient to assess and evaluate symptoms and progress in treatment, Medication management, Plan inpatient treatment and medications as below Encourage group and milieu participation to work on coping skills and symptom reduction Treatment team working on disposition planning options Discontinue Trazodone due to side effects Continue Vistaril 25 mgrs Q 6 hours PRN for anxiety as needed  Continue Lithium 900 mgrs QHS for mood disorder  Continue Lamictal 25 mgrs BID for mood disorder  Continue Minipress 1 mgr QHS for PTSD related nightmares  Continue Risperidone at 0.5 mgrs QHS for mood disorder, insomnia    Jenne Campus, MD 08/03/2017, 11:00 AM

## 2017-08-03 NOTE — Progress Notes (Signed)
Adult Psychoeducational Group Note  Date:  08/03/2017 Time:  1515 Group Topic/Focus:  Relaxation activities.   The purpose of this group is to help patients identify activities for coping with anxiety and depression.   Participation Level:  Active  Participation Quality:  Appropriate  Affect:  Appropriate  Cognitive:  Appropriate  Insight: Appropriate  Engagement in Group:  Engaged  Modes of Intervention:  Activity  Additional Comments:    Morrisa Aldaba L 08/03/2017  

## 2017-08-03 NOTE — Progress Notes (Signed)
Recreation Therapy Notes  Date: 2.19.19 Time: 2:45 pm  [facility]   AAA/T Program Assumption of Risk Form signed by Patient/ or Parent Legal Guardian Yes  Patient is free of allergies or sever asthma Yes  Patient reports no fear of animals Yes  Patient reports no history of cruelty to animals Yes  Patient understands his/her participation is voluntary Yes  Patient washes hands before animal contact Yes  Patient washes hands after animal contact Yes  Behavioral Response: Engaged   Education:Hand Washing, Appropriate Animal Interaction   Education Outcome: Acknowledges education.   Clinical Observations/Feedback: Patient attended session and interacted appropriately with therapy dog and peers. Patient asked appropriate questions about therapy dog and his training. Patient shared stories about their pets at home with group.   Sheryle Hailarian Edker Punt, Recreation Therapy Intern   Sheryle HailDarian Melissa Pulido 08/03/2017 2:59 PM

## 2017-08-03 NOTE — Progress Notes (Signed)
Patient denies SI, HI and AVH this shift.  Patient reports feeling a little anxious, but denies all other symptoms.  Patient has been participating in groups and compliant with medications. Patient has exhibited no behavioral dsycontrol.   Assess patient for safety, offer medications as prescribed, engage patient in 1:1 staff talks.   Continue to monitor as planned.   Patient able to contract for safety.

## 2017-08-03 NOTE — Plan of Care (Signed)
  Progressing Activity: Sleeping patterns will improve 08/03/2017 [id number] - Progressing by Arrie Aranhurch, Everlyn Farabaugh J, RN Note Pt slept 6.5 hours last night.

## 2017-08-03 NOTE — Progress Notes (Signed)
Adult Psychoeducational Group Note  Date:  08/03/2017 Time:  9:42 PM  Group Topic/Focus:  Wrap-Up Group:   The focus of this group is to help patients review their daily goal of treatment and discuss progress on daily workbooks.  Participation Level:  Active  Participation Quality:  Appropriate  Affect:  Appropriate  Cognitive:  Alert  Insight: Appropriate  Engagement in Group:  Engaged  Modes of Intervention:  Discussion  Additional Comments:  Patient stated having a busy day. Patient also stated her day has been positive in so many ways. Patient's goal for today was to learn something new.  Fabiana Dromgoole L Darious Rehman 08/03/2017, 9:42 PM

## 2017-08-03 NOTE — Progress Notes (Signed)
Adult Psychoeducational Group Note  Date:  08/03/2017 Time:  0830 Group Topic/Focus:  Goals Group:   The focus of this group is to help patients establish daily goals to achieve during treatment and discuss how the patient can incorporate goal setting into their daily lives to aide in recovery.  Participation Level:  Active  Participation Quality:  Appropriate  Affect:  Appropriate  Cognitive:  Alert  Insight: Appropriate  Engagement in Group:  Engaged  Modes of Intervention:  Discussion and Orientation  Additional Comments:    Maretta Losli, Seniah Lawrence [name] 08/03/2017, 9:21 AM

## 2017-08-03 NOTE — [facility] Group Notes (Signed)
[facility] LCSW Group Therapy Note  Date/Time: 08/03/17, 1315  Type of Therapy/Topic:  Group Therapy:  Feelings about Diagnosis  Participation Level:  Active   Mood:pleasant   Description of Group:    This group will allow patients to explore their thoughts and feelings about diagnoses they have received. Patients will be guided to explore their level of understanding and acceptance of these diagnoses. Facilitator will encourage patients to process their thoughts and feelings about the reactions of others to their diagnosis, and will guide patients in identifying ways to discuss their diagnosis with significant others in their lives. This group will be process-oriented, with patients participating in exploration of their own experiences as well as giving and receiving support and challenge from other group members.   Therapeutic Goals: 1. Patient will demonstrate understanding of diagnosis as evidence by identifying two or more symptoms of the disorder:  2. Patient will be able to express two feelings regarding the diagnosis 3. Patient will demonstrate ability to communicate their needs through discussion and/or role plays  Summary of Patient Progress:Pt very active in group discussion today regarding diagnoses, symptoms, and mental health stigma.  Good participation.          Therapeutic Modalities:   Cognitive Behavioral Therapy Brief Therapy Feelings Identification   Greg Mya Suell, LCSW 

## 2017-08-04 DIAGNOSIS — F515 Nightmare disorder: Secondary | ICD-10-CM

## 2017-08-04 DIAGNOSIS — F3181 Bipolar II disorder: Secondary | ICD-10-CM

## 2017-08-04 DIAGNOSIS — F1990 Other psychoactive substance use, unspecified, uncomplicated: Secondary | ICD-10-CM

## 2017-08-04 MED ORDER — LAMOTRIGINE 25 MG PO TABS
50.0000 mg | ORAL_TABLET | Freq: Two times a day (BID) | ORAL | Status: DC
  Administered 2017-08-04 – 2017-08-05 (×2): 50 mg via ORAL
  Filled 2017-08-04: qty 28
  Filled 2017-08-04: qty 2
  Filled 2017-08-04: qty 28
  Filled 2017-08-04 (×3): qty 2

## 2017-08-04 NOTE — Progress Notes (Signed)
Patient does not want to take lithium again, concerned about side effects.

## 2017-08-04 NOTE — Progress Notes (Addendum)
[facility] MD Progress Note  08/04/2017 12:44 PM Evelyn Lopez  MRN:  [id number]   Subjective:  Patient reports that she was concerned about being on the Lithium, but it was explained better to her in group and wants to continue it, but something made her feel bad last night. She reports that after she took all of her QHS medications she felt as if she was going to pass out and had to be walked to her room and rest in her bed for a while and cool off. She stated that the only thing that was changed last night was the Risperidone. Denies any SI/HI/AVH contracts for safety. Sh estates that she is improving and hopes to be discharged soon.   Objective: Patient's chart and findings reviewed and discussed with treatment team. Patient present I the day room and is pleasant and cooperative. After reviewing medications, will increase Lamictal to 50 mg BID since she has been ion it since January, discontinue the Risperidone and discontinue the Librium as she does not want any controlled substances when she leaves the hospital.   Principal Problem: Bipolar II disorder ([facility]) Diagnosis:   Patient Active Problem List   Diagnosis Date Noted  . Panic disorder [F41.0] 08/02/2017  . Substance use disorder [F19.90] 08/02/2017  . Bipolar II disorder ([facility]) [F31.81] 08/01/2017   Total Time spent with patient: 25 minutes  Past Psychiatric History: See H&P  Past Medical History:  Past Medical History:  Diagnosis Date  . Bipolar disorder ([facility])   . Chronic back pain   . Ectopic pregnancy    June 2013  . Multiple personalities ([facility])   . Ovarian cyst   . Panic attacks   . PTSD (post-traumatic stress disorder)     Past Surgical History:  Procedure Laterality Date  . LAPAROSCOPY  12/29/2011   Procedure: LAPAROSCOPY OPERATIVE;  Surgeon: Tresa Endo A. Ernestina Penna, MD;  Location: [facility];  Service: Gynecology;  Laterality: Bilateral;  Right Salpingectomy   Family History: History reviewed. No pertinent family history. Family  Psychiatric  History: See H&P Social History:  Social History   Substance and Sexual Activity  Alcohol Use No   Comment: ocassionally      Social History   Substance and Sexual Activity  Drug Use No    Social History   Socioeconomic History  . Marital status: Single    Spouse name: None  . Number of children: None  . Years of education: None  . Highest education level: None  Social Needs  . Financial resource strain: None  . Food insecurity - worry: None  . Food insecurity - inability: None  . Transportation needs - medical: None  . Transportation needs - non-medical: None  Occupational History  . None  Tobacco Use  . Smoking status: Current Every Day Smoker    Packs/day: 0.50    Types: Cigarettes  . Smokeless tobacco: Never Used  Substance and Sexual Activity  . Alcohol use: No    Comment: ocassionally   . Drug use: No  . Sexual activity: Yes    Birth control/protection: None  Other Topics Concern  . None  Social History Narrative  . None   Additional Social History:                         Sleep: Fair  Appetite:  Good  Current Medications: Current Facility-Administered Medications  Medication Dose Route Frequency Provider Last Rate Last Dose  . acetaminophen (TYLENOL) tablet 650  mg  650 mg Oral Q4H PRN Okonkwo, Justina A, NP   650 mg at 08/04/17 0746  . alum & mag hydroxide-simeth (MAALOX/MYLANTA) 200-200-20 MG/5ML suspension 30 mL  30 mL Oral Q4H PRN Okonkwo, Justina A, NP      . ferrous sulfate tablet 325 mg  325 mg Oral Daily Okonkwo, Justina A, NP   325 mg at 08/04/17 0741  . hydrOXYzine (ATARAX/VISTARIL) tablet 25 mg  25 mg Oral Q6H PRN Cobos, Rockey SituFernando A, MD   25 mg at 08/04/17 1144  . lamoTRIgine (LAMICTAL) tablet 50 mg  50 mg Oral BID Money, Feliz Beamravis B, FNP      . lithium carbonate capsule 900 mg  900 mg Oral QHS Izediuno, Delight OvensVincent A, MD   900 mg at 08/03/17 2144  . magnesium hydroxide (MILK OF MAGNESIA) suspension 30 mL  30 mL Oral Daily  PRN Okonkwo, Justina A, NP      . nicotine (NICODERM CQ - dosed in mg/24 hours) patch 21 mg  21 mg Transdermal Daily Okonkwo, Justina A, NP   21 mg at 08/04/17 0618  . nicotine polacrilex (NICORETTE) gum 2 mg  2 mg Oral PRN Nira ConnBerry, Jason A, NP      . ondansetron (ZOFRAN) tablet 4 mg  4 mg Oral Q8H PRN Okonkwo, Justina A, NP      . prazosin (MINIPRESS) capsule 1 mg  1 mg Oral QHS Donell SievertSimon, Spencer E, PA-C   1 mg at 08/03/17 2144    Lab Results: No results found for this or any previous visit (from the past 48 hour(s)).  Blood Alcohol level:  Lab Results  Component Value Date   ETH <10 08/01/2017    Metabolic Disorder Labs: No results found for: HGBA1C, MPG No results found for: PROLACTIN No results found for: CHOL, TRIG, HDL, CHOLHDL, VLDL, LDLCALC  Physical Findings: AIMS: Facial and Oral Movements Muscles of Facial Expression: None, normal Lips and Perioral Area: None, normal Jaw: None, normal Tongue: None, normal,Extremity Movements Upper (arms, wrists, hands, fingers): None, normal Lower (legs, knees, ankles, toes): None, normal, Trunk Movements Neck, shoulders, hips: None, normal, Overall Severity Severity of abnormal movements (highest score from questions above): None, normal Incapacitation due to abnormal movements: None, normal Patient's awareness of abnormal movements (rate only patient's report): No Awareness, Dental Status Current problems with teeth and/or dentures?: No Does patient usually wear dentures?: No  CIWA:  CIWA-Ar Total: 6 COWS:  COWS Total Score: 4  Musculoskeletal: Strength & Muscle Tone: within normal limits Gait & Station: normal Patient leans: N/A  Psychiatric Specialty Exam: Physical Exam  Nursing note and vitals reviewed. Constitutional: She is oriented to person, place, and time. She appears well-developed and well-nourished.  Cardiovascular: Normal rate.  Respiratory: Effort normal.  Musculoskeletal: Normal range of motion.  Neurological:  She is alert and oriented to person, place, and time.  Skin: Skin is warm.    Review of Systems  Constitutional: Negative.   HENT: Negative.   Eyes: Negative.   Respiratory: Negative.   Cardiovascular: Negative.   Gastrointestinal: Negative.   Genitourinary: Negative.   Musculoskeletal: Negative.   Neurological: Negative.   Endo/Heme/Allergies: Negative.   Psychiatric/Behavioral: Negative.     Blood pressure (!) 132/92, pulse 96, temperature 97.8 F (36.6 C), temperature source Oral, resp. rate 16, height 5\' 9"  (1.753 m), weight 111.1 kg (245 lb), last menstrual period 07/17/2017, SpO2 98 %.Body mass index is 36.18 kg/m.  General Appearance: Casual  Eye Contact:  Good  Speech:  Clear  and Coherent and Normal Rate  Volume:  Normal  Mood:  Euthymic  Affect:  Flat  Thought Process:  Goal Directed and Descriptions of Associations: Intact  Orientation:  Full (Time, Place, and Person)  Thought Content:  WDL  Suicidal Thoughts:  No  Homicidal Thoughts:  No  Memory:  Immediate;   Good Recent;   Good Remote;   Good  Judgement:  Good  Insight:  Good  Psychomotor Activity:  Normal  Concentration:  Concentration: Good and Attention Span: Good  Recall:  Good  Fund of Knowledge:  Good  Language:  Good  Akathisia:  No  Handed:  Right  AIMS (if indicated):     Assets:  Communication Skills Desire for Improvement Financial Resources/Insurance Housing Physical Health Social Support Transportation  ADL's:  Intact  Cognition:  WNL  Sleep:  Number of Hours: 6.5   Problems Addressed: Bipolar II  Treatment Plan Summary: Daily contact with patient to assess and evaluate symptoms and progress in treatment, Medication management and Plan is to:  -Discontinue Risperidone -Increase Lamictal 50 mg PO BID for mood stability -Continue Lithium Carbonate 900 mg PO QHS for mood stability and sleep -Continue Prazosin 1 mg PO QHS for PTSD nightmares -Continue Vistaril 25 mg PO Q6H PRN for  anxiety -Discontinue Librium -Encourage group therapy participation  Maryfrances Bunnell, FNP 08/04/2017, 12:44 PM   Agree with NP Progress Note

## 2017-08-04 NOTE — [facility] Group Notes (Signed)
[facility] Group Therapy                                                             08/04/2017  2:51 PM  Type of Therapy: [facility] Presentation  Participation Level: Active   Participation Quality: Attentive  Affect: Appropriate  Cognitive: Oriented  Insight: Developing/Improving  Engagement in Therapy: Engaged  Modes of Intervention: Discussion, Education and Socialization  Summary of Progress/Problems: Pateint was attentive and involved with the group  Suhayb Anzalone, SW Intern  

## 2017-08-04 NOTE — Progress Notes (Signed)
D: Pt was in her room with visitors upon initial approach.  Pt presents with anxious affect and mood.  Her goal today was to "be a sponge and that's what I did."  She reports "the pharmacist came and I took a whole page of notes on Lithium, then I had a 1:1 with the nurse practitioner and that went really well."  She reports she feels much better than she did 2 days ago and her family has noticed a positive difference as well.   Pt denies SI/HI, denies hallucinations, denies pain.  Pt has been visible in milieu interacting with peers and staff appropriately.  Pt attended evening group.    A: Introduced self to pt.  Actively listened to pt and offered support and encouragement. Medications administered per order.  PRN medication administered for anxiety per request.  Q15 minute safety checks maintained.  R: Pt is safe on the unit.  Pt is compliant with medications.  Pt verbally contracts for safety.  Will continue to monitor and assess.

## 2017-08-04 NOTE — Progress Notes (Signed)
Adult Psychoeducational Group Note  Date:  08/04/2017 Time:  1600 Group Topic/Focus:  Identifying Needs:   The focus of this group is to help patients identify their personal needs that have been historically problematic and identify healthy behaviors to address their needs.  Participation Level:  Active  Participation Quality:  Appropriate and Attentive  Affect:  Appropriate  Cognitive:  Appropriate  Insight: Appropriate and Good  Engagement in Group:  Engaged  Modes of Intervention:  Discussion, Role-play and Support  Additional Comments:  Pt participated in group discussion   Gwenevere Ghazili, Nataki Mccrumb [name] 08/04/2017, 4:57 PM

## 2017-08-04 NOTE — Plan of Care (Signed)
Nurse discussed anxiety, depression, coping skills with patient. 

## 2017-08-04 NOTE — Progress Notes (Signed)
Pt reports better sleep last night.  Complains of feeling "jittery" this morning.  Mild hand tremor noted.  PRN medication administered for withdrawal.  Pt educated on Lithium and pt provided with printout.  She expressed some reservations related to being on Lithium after reading her handout and pt was encouraged to discuss further with provider today.

## 2017-08-04 NOTE — Progress Notes (Signed)
D: Pt was in the dayroom with visitors upon initial approach.  Pt presents with anxious affect and mood.  Her goal is to sleep better tonight.  Pt denies SI/HI, denies hallucinations, reports pain from headache of 7/10.  She reports she had a "wonderful" visit with her mother and fiance tonight.  Pt has been visible in milieu interacting with peers and staff appropriately.  Pt attended evening group.    A: Introduced self to pt.  Actively listened to pt and offered support and encouragement. Medications administered per order.  PRN medication administered for anxiety and pain.  Q15 minute safety checks maintained.  R: Pt is safe on the unit.  Pt is compliant with medications.  Pt verbally contracts for safety.  Will continue to monitor and assess.

## 2017-08-04 NOTE — Progress Notes (Signed)
Recreation Therapy Notes  Date: 08/04/17 Time: 0930 Location: 300 [address] Dayroom  Group Topic: Stress Management  Goal Area(s) Addresses:  Patient will verbalize importance of using healthy stress management.  Patient will identify positive emotions associated with healthy stress management.   Intervention: Stress Management  Activity :  [facility].  LRT introduced the stress management technique of guided imagery.  LRT read a script to allow patients to visualize the sights and sounds of being in the forest.  Patients were to follow along as the script was read to participate in activity.  Education: Stress Management, Discharge Planning.   Education Outcome: Acknowledges edcuation/In group clarification offered/Needs additional education  Clinical Observations/Feedback: Pt did not attend group.    Caroll RancherMarjette Nicola Quesnell, LRT/CTRS         Caroll RancherLindsay, Emelee Rodocker A 08/04/2017 1:06 PM

## 2017-08-04 NOTE — Progress Notes (Signed)
Adult Psychoeducational Group Note  Date:  08/04/2017 Time:  0830 Group Topic/Focus:  Goals Group:   The focus of this group is to help patients establish daily goals to achieve during treatment and discuss how the patient can incorporate goal setting into their daily lives to aide in recovery.  Participation Level:  Active  Participation Quality:  Appropriate  Affect:  Appropriate  Cognitive:  Appropriate  Insight: Appropriate  Engagement in Group:  Engaged  Modes of Intervention:  Discussion, Rapport Building and Support  Additional Comments: Pt attended orientation group   Gwenevere Ghazili, Kawhi Diebold [name] 08/04/2017, 9:53 AM

## 2017-08-04 NOTE — [facility] Group Notes (Signed)
[facility] Group Notes:  (Nursing/MHT/Case Management/Adjunct)  Date:  08/04/2017  Time:  1:30 p.m.  Type of Therapy:  Psychoeducational Skills  Participation Level:  Active  Participation Quality:  Appropriate  Affect:  Appropriate  Cognitive:  Appropriate  Insight:  Appropriate  Engagement in Group:  Engaged  Modes of Intervention:  Education  Summary of Progress/Problems:  Patient was attentive and involved with therapeutic relaxation group.   Earline MayotteKnight, Evan Osburn Shephard 08/04/2017, 2:43 PM

## 2017-08-04 NOTE — Progress Notes (Signed)
Adult Psychoeducational Group Note  Date:  08/04/2017 Time:  9:39 PM  Group Topic/Focus:  Wrap-Up Group:   The focus of this group is to help patients review their daily goal of treatment and discuss progress on daily workbooks.  Participation Level:  Active  Participation Quality:  Appropriate  Affect:  Appropriate  Cognitive:  Alert  Insight: Appropriate  Engagement in Group:  Engaged  Modes of Intervention:  Discussion  Additional Comments:  Patient stated having a good day. Patient's goal for today was to be a sponge.   Evelyn Lopez 08/04/2017, 9:39 PM

## 2017-08-04 NOTE — Progress Notes (Signed)
D:  Patient's self inventory sheet, patient sleeps good, sleep medication helpful.  Good appetite, normal energy level, good concentration.  Denied depression and hopeless, rated anxiety #3.  Denied withdrawals.  Denied SI.  Denied physical problems.  Denied physical pain.  Goal is to remain positive and look forward to bright future.  Plans to stay positive and be positive.  "I want to stop poisoning my body with pills."  Does have discharge plans. A:  Medications administered per MD orders.  Emotional support and encouragement given patient. R:  Denied SI and HI, contracts for safety.  Denied A/V hallucinations.  Safety maintained with 15 minute checks.

## 2017-08-05 LAB — LITHIUM LEVEL: Lithium Lvl: 0.64 mmol/L (ref 0.60–1.20)

## 2017-08-05 MED ORDER — HYDROXYZINE HCL 25 MG PO TABS: 25.0000 mg | ORAL_TABLET | Freq: Four times a day (QID) | ORAL | 0 refills | Status: DC | PRN

## 2017-08-05 MED ORDER — LITHIUM CARBONATE 300 MG PO CAPS: 900.0000 mg | ORAL_CAPSULE | Freq: Every day | ORAL | 0 refills | Status: DC

## 2017-08-05 MED ORDER — PRAZOSIN HCL 1 MG PO CAPS: 1.0000 mg | ORAL_CAPSULE | Freq: Every day | ORAL | 0 refills | Status: DC

## 2017-08-05 MED ORDER — LAMOTRIGINE 25 MG PO TABS: 50.0000 mg | ORAL_TABLET | Freq: Two times a day (BID) | ORAL | 0 refills | Status: DC
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
## 2017-08-05 NOTE — Progress Notes (Signed)
Discharge note:  Patient discharged home per MD order.  Patient will follow up with [facility].  AVS/transition record was reviewed with patient and she indicated understanding.  She received all personal belongings from unit locker.  She denies any thoughts of self harm.  Patient left ambulatory with her husband.

## 2017-08-05 NOTE — Progress Notes (Signed)
  [facility] Discharge [facility] :  Will you be returning to the same living situation after discharge:  Yes,  with parent At discharge, do you have transportation home?: Yes,  fiancee Do you have the ability to pay for your medications: Yes,  [facility]  Release of information consent forms completed and in the chart;  Patient's signature needed at discharge.  Patient to Follow up at: Follow-up Information    Center, [facility]. Go on 08/05/2017.   Specialty:  Behavioral Health Why:  Please attend your therapy appt with Almond LintLynn Norris on Monday, 08/16/17 at 9:00am.  Please attend your medication appt with Dr Betti Cruzeddy on Thursday, 09/02/17, at 1:10pm. Contact information: [address] [place] [address] [phone]           Next level of care provider has access to [facility] Link:no  Safety Planning and Suicide Prevention discussed: Yes,  with mother  Have you used any form of tobacco in the last 30 days? (Cigarettes, Smokeless Tobacco, Cigars, and/or Pipes): Yes  Has patient been referred to the Quitline?: Patient refused referral  Patient has been referred for addiction treatment: Yes  Lorri FrederickWierda, Joevon Holliman Jon, LCSW 08/05/2017, 11:37 AM

## 2017-08-05 NOTE — Plan of Care (Signed)
Completed/Met Activity: Interest or engagement in activities will improve 08/05/2017 1400 - Completed/Met by Joice Lofts, RN Sleeping patterns will improve 08/05/2017 1400 - Completed/Met by Joice Lofts, RN Education: Knowledge of [place] Education information/materials will improve 08/05/2017 1400 - Completed/Met by Joice Lofts, RN Emotional status will improve 08/05/2017 1400 - Completed/Met by Joice Lofts, RN Mental status will improve 08/05/2017 1400 - Completed/Met by Joice Lofts, RN Verbalization of understanding the information provided will improve 08/05/2017 1400 - Completed/Met by Joice Lofts, RN Coping: Ability to verbalize frustrations and anger appropriately will improve 08/05/2017 1400 - Completed/Met by Joice Lofts, RN Ability to demonstrate self-control will improve 08/05/2017 1400 - Completed/Met by Joice Lofts, RN Health Behavior/Discharge Planning: Identification of resources available to assist in meeting health care needs will improve 08/05/2017 1400 - Completed/Met by Joice Lofts, RN Compliance with treatment plan for underlying cause of condition will improve 08/05/2017 1400 - Completed/Met by Joice Lofts, RN Physical Regulation: Ability to maintain clinical measurements within normal limits will improve 08/05/2017 1400 - Completed/Met by Joice Lofts, RN Safety: Periods of time without injury will increase 08/05/2017 1400 - Completed/Met by Joice Lofts, RN Education: Ability to state activities that reduce stress will improve 08/05/2017 1400 - Completed/Met by Joice Lofts, RN Coping: Ability to identify and develop effective coping behavior will improve 08/05/2017 1400 - Completed/Met by Joice Lofts, RN Self-Concept: Ability to identify factors that promote anxiety will improve 08/05/2017 1400 - Completed/Met by Joice Lofts, RN Level of  anxiety will decrease 08/05/2017 1400 - Completed/Met by Joice Lofts, RN Ability to modify response to factors that promote anxiety will improve 08/05/2017 1400 - Completed/Met by Joice Lofts, RN Education: Knowledge of disease or condition will improve 08/05/2017 1400 - Completed/Met by Joice Lofts, RN Understanding of discharge needs will improve 08/05/2017 1400 - Completed/Met by [place], [place] Behavior/Discharge Planning: Ability to identify changes in lifestyle to reduce recurrence of condition will improve 08/05/2017 1400 - Completed/Met by Joice Lofts, RN Identification of resources available to assist in meeting health care needs will improve 08/05/2017 1400 - Completed/Met by Joice Lofts, RN Physical Regulation: Complications related to the disease process, condition or treatment will be avoided or minimized 08/05/2017 1400 - Completed/Met by Joice Lofts, RN Safety: Ability to remain free from injury will improve 08/05/2017 1400 - Completed/Met by Joice Lofts, RN Activity: Interest or engagement in leisure activities will improve 08/05/2017 1400 - Completed/Met by Joice Lofts, RN Imbalance in normal sleep/wake cycle will improve 08/05/2017 1400 - Completed/Met by Joice Lofts, RN Education: Utilization of techniques to improve thought processes will improve 08/05/2017 1400 - Completed/Met by Joice Lofts, RN Knowledge of the prescribed therapeutic regimen will improve 08/05/2017 1400 - Completed/Met by Joice Lofts, RN Coping: Ability to Girdler will improve 08/05/2017 1400 - Completed/Met by Joice Lofts, RN Ability to verbalize feelings will improve 08/05/2017 1400 - Completed/Met by Joice Lofts, [place] Behavior/Discharge Planning: Ability to make decisions will improve 08/05/2017 1400 - Completed/Met by Joice Lofts, RN Compliance with therapeutic regimen will  improve 08/05/2017 1400 - Completed/Met by Joice Lofts, RN Role Relationship: Ability to demonstrate positive changes in social behaviors and relationships will improve 08/05/2017 1400 - Completed/Met by Joice Lofts, RN Safety: Ability to disclose and discuss suicidal ideas will improve 08/05/2017 1400 - Completed/Met by Joice Lofts,  RN Ability to identify and utilize support systems that promote safety will improve 08/05/2017 1400 - Completed/Met by Joice Lofts, RN Self-Concept: Ability to verbalize positive feelings about self will improve 08/05/2017 1400 - Completed/Met by Joice Lofts, RN Level of anxiety will decrease 08/05/2017 1400 - Completed/Met by Joice Lofts, RN

## 2017-08-05 NOTE — [facility] Suicide Risk Assessment (Addendum)
[facility]   Principal Problem: Bipolar II disorder [facility](HCC) Discharge Diagnoses:  Patient Active Problem List   Diagnosis Date Noted  . Panic disorder [F41.0] 08/02/2017  . Substance use disorder [F19.90] 08/02/2017  . Bipolar II disorder (HCC) [F31.81] 08/01/2017    Total Time spent with patient: 30 minutes  Musculoskeletal: Strength & Muscle Tone: within normal limits Gait & Station: normal Patient leans: N/A  Psychiatric Specialty Exam: ROS denies headache, no chest pain, no shortness of breath, no vomiting, no fever   Blood pressure 126/77, pulse 75, temperature 98.1 F (36.7 C), temperature source Oral, resp. rate 16, height 5\' 9"  (1.753 m), weight 111.1 kg (245 lb), last menstrual period 07/17/2017, SpO2 98 %.Body mass index is 36.18 kg/m.  General Appearance: Well Groomed  Eye Contact::  Good  Speech:  Normal Rate409  Volume:  Normal  Mood:  reports she is feeling " much better", and presents euthymic  Affect:  Appropriate and Full Range  Thought Process:  Linear and Descriptions of Associations: Intact  Orientation:  Full (Time, Place, and Person)  Thought Content:  denies hallucinations,no delusions, not internally preoccupied   Suicidal Thoughts:  No no suicidal ideations, no homicidal or violent ideations  Homicidal Thoughts:  No  Memory:  recent and remote grossly intact   Judgement:  Other:  improving   Insight:  improving   Psychomotor Activity:  Normal  Concentration:  Good  Recall:  Good  Fund of Knowledge:Good  Language: Good  Akathisia:  Negative  Handed:  Right  AIMS (if indicated):     Assets:  Communication Skills Desire for Improvement Resilience  Sleep:  Number of Hours: 6.25  Cognition: WNL  ADL's:  Intact   Mental Status Per Nursing Assessment::   On Admission:     Demographic Factors:  37 year old female, lives with fiance/parents   Loss Factors: Some relationship stressors  Historical Factors: Reports  history of mood disorder and  history of PTSD symptoms, no prior psychiatric inpatient admissions , no history of prior suicidal attempts . Reports history of good response to Lithium/Lamictal .  Risk Reduction Factors:   Living with another person, especially a relative and Positive coping skills or problem solving skills  Continued Clinical Symptoms:  At this time patient is alert, attentive, well related, well groomed, states her mood is much improved, currently euthymic, with full range of affect. No thought disorder, no suicidal or self injurious ideations, no homicidal or violent ideations, no hallucinations, no delusions, future oriented . Denies medication side effects- no current tremors. Lithium level therapeutic at 0.64 . We reviewed side effects , to include potential symptoms of lithium toxicity and teratogenic effects of lithium- patient expressed understanding . Visible on unit, pleasant on approach.  Cognitive Features That Contribute To Risk:  No gross cognitive deficits noted upon discharge. Is alert , attentive, and oriented x 3   Suicide Risk:  Mild:  Suicidal ideation of limited frequency, intensity, duration, and specificity.  There are no identifiable plans, no associated intent, mild dysphoria and related symptoms, good self-control (both objective and subjective assessment), few other risk factors, and identifiable protective factors, including available and accessible social support.  Follow-up Information    [facility]. Go on 08/05/2017.   Specialty:  Behavioral Health Why:  Please attend your therapy appt with Almond LintLynn Norris on Monday, 08/16/17 at 9:00am.  Please attend your medication appt with Dr Betti Cruzeddy on Thursday, 09/02/17, at 1:10pm. Contact information: [address] [place] [address] [phone]           Plan Of Care/Follow-up recommendations:  Activity:  as tolerated  Diet:  regular Tests:  NA Other:  See  below  Patient is expressing readiness for discharge and is leaving in good spirits  Plans to return home Follow up as above .  Craige Cotta, MD 08/05/2017, 11:37 AM

## 2017-10-09 ENCOUNTER — Encounter (HOSPITAL_BASED_OUTPATIENT_CLINIC_OR_DEPARTMENT_OTHER): Payer: Self-pay | Admitting: *Deleted

## 2017-10-09 ENCOUNTER — Emergency Department (HOSPITAL_BASED_OUTPATIENT_CLINIC_OR_DEPARTMENT_OTHER): Payer: PRIVATE HEALTH INSURANCE

## 2017-10-09 ENCOUNTER — Other Ambulatory Visit: Payer: Self-pay

## 2017-10-09 ENCOUNTER — Emergency Department (HOSPITAL_BASED_OUTPATIENT_CLINIC_OR_DEPARTMENT_OTHER)
Admission: EM | Admit: 2017-10-09 | Discharge: 2017-10-09 | Disposition: A | Discharge status: Home / Self Care | Payer: PRIVATE HEALTH INSURANCE | Attending: Emergency Medicine | Admitting: Emergency Medicine

## 2017-10-09 DIAGNOSIS — M79674 Pain in right toe(s): Secondary | ICD-10-CM

## 2017-10-09 DIAGNOSIS — F17210 Nicotine dependence, cigarettes, uncomplicated: Secondary | ICD-10-CM | POA: Insufficient documentation

## 2017-10-09 DIAGNOSIS — W228XXA Striking against or struck by other objects, initial encounter: Secondary | ICD-10-CM | POA: Insufficient documentation

## 2017-10-09 DIAGNOSIS — Y9389 Activity, other specified: Secondary | ICD-10-CM | POA: Insufficient documentation

## 2017-10-09 DIAGNOSIS — Z79899 Other long term (current) drug therapy: Secondary | ICD-10-CM | POA: Insufficient documentation

## 2017-10-09 DIAGNOSIS — Y929 Unspecified place or not applicable: Secondary | ICD-10-CM | POA: Insufficient documentation

## 2017-10-09 DIAGNOSIS — S90411A Abrasion, right great toe, initial encounter: Secondary | ICD-10-CM | POA: Insufficient documentation

## 2017-10-09 DIAGNOSIS — T148XXA Other injury of unspecified body region, initial encounter: Secondary | ICD-10-CM

## 2017-10-09 DIAGNOSIS — Y999 Unspecified external cause status: Secondary | ICD-10-CM | POA: Insufficient documentation

## 2017-10-09 MED ORDER — IBUPROFEN 400 MG PO TABS
600.0000 mg | ORAL_TABLET | Freq: Once | ORAL | Status: AC
  Administered 2017-10-09: 20:00:00 600 mg via ORAL
  Filled 2017-10-09: qty 1

## 2017-10-09 MED ORDER — ACETAMINOPHEN 325 MG PO TABS
650.0000 mg | ORAL_TABLET | Freq: Once | ORAL | Status: AC
  Administered 2017-10-09: 650 mg via ORAL
  Filled 2017-10-09: qty 2

## 2017-10-09 NOTE — ED Notes (Signed)
Alert, NAD, calm, interactive, resps e/u, speaking in clear complete sentences, no dyspnea noted, skin W&D, initial VSS, here for R forefoot & toe injury (1-4), c/o 8/10 pain, (denies: numbness, tingling, nausea, fever, dizziness, or h/o DM), Tdap UTD. EDPA into room. Abrasions noted b/w toes 1&2, bruising & swelling noted to toes 2&3. Injured by kicking glass door causing shattered glass.

## 2017-10-09 NOTE — ED Triage Notes (Signed)
Pt states she nudged an older door yesterday that was stuck and now noticed bruising and a healing lac to the toes on her right foot.

## 2017-10-09 NOTE — ED Provider Notes (Signed)
[facility] EMERGENCY DEPARTMENT Provider Note   CSN: [id number] Arrival date & time: 10/09/17  1926     History   Chief Complaint Chief Complaint  Patient presents with  . Laceration    HPI Evelyn Lopez is a 37 y.o. female.  The history is provided by the patient and medical records. No language interpreter was used.  Laceration     Evelyn Lopez is a 38 y.o. female who presents to the Emergency Department complaining of pain to the right great toe second digit which occurred yesterday.  Patient states that she has an old glass store.  She was trying to get this open and pushed too hard, causing her to hit her foot.  She did sustain abrasions across the foot as well.  No medications taken prior to arrival for symptoms.  No numbness, tingling or weakness.  Her tetanus is up-to-date.  Pain is worse with ambulation or touching the bruised area.  Past Medical History:  Diagnosis Date  . Bipolar disorder (HCC)   . Chronic back pain   . Ectopic pregnancy    June 2013  . Multiple personalities (HCC)   . Ovarian cyst   . Panic attacks   . PTSD (post-traumatic stress disorder)     Patient Active Problem List   Diagnosis Date Noted  . Panic disorder 08/02/2017  . Substance use disorder 08/02/2017  . Other bipolar disorder (HCC) 08/01/2017    Past Surgical History:  Procedure Laterality Date  . LAPAROSCOPY  12/29/2011   Procedure: LAPAROSCOPY OPERATIVE;  Surgeon: Tresa Endo A. Ernestina Penna, MD;  Location: [facility];  Service: Gynecology;  Laterality: Bilateral;  Right Salpingectomy     OB History    Gravida  2   Para  0   Term  0   Preterm  0   AB  0   Living  0     SAB  0   TAB  0   Ectopic  0   Multiple  0   Live Births               Home Medications    Prior to Admission medications   Medication Sig Start Date End Date Taking? Authorizing Provider  albuterol (PROVENTIL HFA;VENTOLIN HFA) 108 (90 Base) MCG/ACT inhaler Inhale 1-2 puffs into the  lungs every 6 (six) hours as needed for wheezing. 11/12/15   Rolland Porter, MD  ferrous sulfate 325 (65 FE) MG tablet Take 1 tablet (325 mg total) by mouth daily. 10/14/16   Ward, Chase Picket, PA-C  hydrOXYzine (ATARAX/VISTARIL) 25 MG tablet Take 1 tablet (25 mg total) by mouth every 6 (six) hours as needed for anxiety. 08/05/17   Money, Gerlene Burdock, FNP  lamoTRIgine (LAMICTAL) 25 MG tablet Take 2 tablets (50 mg total) by mouth 2 (two) times daily. For mood control 08/05/17   Money, Feliz Beam B, FNP  lithium carbonate 300 MG capsule Take 3 capsules (900 mg total) by mouth at bedtime. For mood control 08/05/17   Money, Feliz Beam B, FNP  prazosin (MINIPRESS) 1 MG capsule Take 1 capsule (1 mg total) by mouth at bedtime. For nightmares 08/05/17   Money, Gerlene Burdock, FNP    Family History History reviewed. No pertinent family history.  Social History Social History   Tobacco Use  . Smoking status: Current Every Day Smoker    Packs/day: 0.50    Types: Cigarettes  . Smokeless tobacco: Never Used  Substance Use Topics  . Alcohol use: No  Comment: ocassionally   . Drug use: No     Allergies   Rocephin [ceftriaxone sodium in dextrose]   Review of Systems Review of Systems  Musculoskeletal: Positive for arthralgias and myalgias.  Skin: Positive for wound.  Neurological: Negative for weakness and numbness.     Physical Exam Updated Vital Signs BP (!) 142/100 (BP Location: Left Arm)   Pulse (!) 108   Temp 98.4 F (36.9 C) (Oral)   Resp 19   Ht  (1.753 m)   Wt 113.4 kg (250 lb)   LMP 09/06/2017 (Exact Date)   SpO2 100%   BMI 36.92 kg/m   Physical Exam  Constitutional: She appears well-developed and well-nourished. No distress.  HENT:  Head: Normocephalic and atraumatic.  Neck: Neck supple.  Cardiovascular: Normal rate, regular rhythm and normal heart sounds.  No murmur heard. Pulmonary/Chest: Effort normal and breath sounds normal. No respiratory distress. She has no wheezes. She has  no rales.  Musculoskeletal:  Full ROM of the right foot. Tenderness to palpation to great and 2nd toes with overlying ecchymosis. No tenderness to 5th metatarsal area or malleoli. 2+ DP. Sensation intact.   Neurological: She is alert.  Skin: Skin is warm and dry.  Superficial abrasions to the top of the right foot. No surrounding erythema. Good cap refill.  Nursing note and vitals reviewed.    ED Treatments / Results  Labs (all labs ordered are listed, but only abnormal results are displayed) Labs Reviewed - No data to display  EKG None  Radiology Dg Foot Complete Right  Result Date: 10/09/2017 CLINICAL DATA:  Patient put foot through glass door. Laceration noted between the first and second digits with bruising of the second digit. EXAM: RIGHT FOOT COMPLETE - 3+ VIEW COMPARISON:  None. FINDINGS: There is no evidence of fracture or dislocation. No radiopaque foreign body identified. Type 2 accessory navicular, an anatomic variant. Prominent plantar and dorsal calcaneal enthesophytes. There is no evidence of arthropathy or other focal bone abnormality. Soft tissues are unremarkable. IMPRESSION: Negative for radiopaque foreign body.  No fracture. Electronically Signed   By: Tollie Eth M.D.   On: 10/09/2017 20:35    Procedures Procedures (including critical care time)  Medications Ordered in ED Medications  acetaminophen (TYLENOL) tablet 650 mg (650 mg Oral Given 10/09/17 2000)  ibuprofen (ADVIL,MOTRIN) tablet 600 mg (600 mg Oral Given 10/09/17 2000)     Initial Impression / Assessment and Plan / ED Course  I have reviewed the triage vital signs and the nursing notes.  Pertinent labs & imaging results that were available during my care of the patient were reviewed by me and considered in my medical decision making (see chart for details).    Evelyn Lopez is a 37 y.o. female who presents to ED for right toe pain after hitting the area on glass door yesterday.  On exam, right foot  is neurovascularly intact.  She does have superficial abrasions without signs of infection.  No lacerations requiring repair.  Wounds were all thoroughly cleaned and irrigated in the emergency department today by nursing staff.  She does have tenderness to the first and second toes with overlying ecchymosis.  Full range of motion, painful.  X-ray was obtained and negative. Evaluation does not show pathology that would require ongoing emergent intervention or inpatient treatment.  Symptomatic home care and wound care instructions were discussed.  PCP follow-up no improvement.  Return precautions discussed and all questions answered.    Final Clinical  Impressions(s) / ED Diagnoses   Final diagnoses:  Pain of toe of right foot  Superficial abrasion    ED Discharge Orders    None       Ward, Chase Picket, PA-C 10/09/17 2054    Maia Plan, MD 10/09/17 2234

## 2017-10-09 NOTE — Discharge Instructions (Addendum)
It was my pleasure taking care of you today!   Fortunately, your x-ray was negative. Tylenol as needed for pain. Ice will help a great deal as well. Rest and elevate the foot to help with pain/swelling. Keep wounds clean and dry.   Follow up with your primary care doctor if your symptoms do not improve.   Return to ER for signs of infection around the wounds, new or worsening symptoms, any additional concerns.

## 2018-03-23 ENCOUNTER — Encounter: Payer: Self-pay | Admitting: Pulmonary Disease

## 2018-03-23 ENCOUNTER — Ambulatory Visit (INDEPENDENT_AMBULATORY_CARE_PROVIDER_SITE_OTHER): Payer: Self-pay | Admitting: Pulmonary Disease

## 2018-03-23 VITALS — BP 132/86 | HR 113 | Ht 70.0 in | Wt 293.0 lb

## 2018-03-23 DIAGNOSIS — R0609 Other forms of dyspnea: Secondary | ICD-10-CM

## 2018-03-23 DIAGNOSIS — R0683 Snoring: Secondary | ICD-10-CM

## 2018-03-23 DIAGNOSIS — Z720 Tobacco use: Secondary | ICD-10-CM

## 2018-03-23 DIAGNOSIS — J45909 Unspecified asthma, uncomplicated: Secondary | ICD-10-CM

## 2018-03-23 DIAGNOSIS — Z6841 Body Mass Index (BMI) 40.0 and over, adult: Secondary | ICD-10-CM

## 2018-03-23 MED ORDER — PREDNISONE 10 MG PO TABS: ORAL_TABLET | ORAL | 0 refills | Status: DC

## 2018-03-23 MED ORDER — AZITHROMYCIN 250 MG PO TABS: ORAL_TABLET | ORAL | 0 refills | Status: DC

## 2018-03-23 MED ORDER — ALBUTEROL SULFATE HFA 108 (90 BASE) MCG/ACT IN AERS: 2.0000 | INHALATION_SPRAY | Freq: Four times a day (QID) | RESPIRATORY_TRACT | 5 refills | Status: DC | PRN

## 2018-03-23 NOTE — Progress Notes (Signed)
[name]  Chief Complaint  Patient presents with  . pulm consult    Pt has increase of SOB while sitting, laying and with exertion. Pt has increase wheezing in last 2 weeks, productive cough - green thick mucus and some chest tightness. Pt has swelling in both legs, and some swelling in hands some days. Pt is requesting flu shot today.    Constitutional:  BP 132/86 (BP Location: Left Arm, Cuff Size: Normal)   Pulse (!) 113   Ht 5\' 10"  (1.778 m)   Wt 293 lb (132.9 kg)   SpO2 100%   BMI 42.04 kg/m   Past Medical History:  Bipolar, Schizophrenia, PTSD, Chronic back pain  Brief Summary:  Evelyn Lopez is a 37 y.o. female smoker with shortness of breath.  Her father passed away several months ago.  She has not been very good with her diet.  She has gained about 30 lbs.  With this weight gain her breathing has gotten worse.  She also has more ankle swelling and soreness in her knees.  She also still smokes.  She was smoking 1.5 ppd, but not down to 0.5 ppd.  She plans to continue gradually decreasing until she quits.  She gets short of breath after walking 15 steps.  She has cough with green sputum, chest tightness and wheezing.  Never told she has asthma before.  Has been on prednisone and albuterol when she gets bronchitis, and these help.  Has been getting sinus congestion more also.  She snores, and will have trouble breathing laying flat.  She has been told that she stops breathing while asleep.  She has trouble falling asleep and staying asleep.  She feels more sleepy during the day.   Physical Exam:   Appearance - well kempt  ENMT - clear nasal mucosa, midline nasal septum, no oral exudates, no LAN, trachea midline Respiratory - normal chest wall, normal respiratory effort, no accessory muscle use, faint b/l expiratory wheeze CV - s1s2 regular rate and rhythm, no murmurs, no peripheral edema, radial pulses symmetric GI - soft, non  tender, no masses Lymph - no adenopathy noted in neck and axillary areas MSK - normal muscle strength and tone, normal gait Ext - no cyanosis, clubbing, or joint inflammation noted Skin - healing superficial burn on left forearm Neuro - oriented to person, place, and time Psych - normal mood and affect  Discussion:  She has progressive dyspnea on exertion.  She has recent weight gain.  She continues to smoke cigarettes.  She has an acute asthmatic bronchitis at present, but might have underlying asthma also.  Assessment/Plan:   Acute asthmatic bronchitis. - prednisone, zithromax, prn albuterol  Possible asthma. - will need CXR, PFT when more stable - will need flu shot when more stable  Tobacco abuse. - reviewed options to help quit smoking - she will continue to gradually decrease smoking on her own - asked her to d/w her psychiatrist about whether she could try chantix or wellbutrin  Obesity. - discussed options to assist with weight loss, including exercise and diet modification  Snoring with sleep disruption and daytime sleepiness. - might need to do a sleep study at some point to assess for sleep apnea   Patient Instructions  Zithromax 250 mg pill >> 2 pills on day 1, then 1 pill daily for next 4 days Prednisone 10 mg pill >> 3 pills daily for 2 days, 2 pills daily for 2 days, 1  pill daily for 2 days Proair two puffs every 6 hours as needed for cough, wheeze, or chest congestion  Follow up in 2 weeks with Dr. Craige Cotta or Nurse Practitioner    Coralyn Helling, MD  Pulmonary/Critical Care Pager: [phone] 03/23/2018, 2:21 PM  Flow Sheet    Pulmonary tests:    Sleep tests:    Review of Systems:  Reviewed and negative.  Medications:   Allergies as of 03/23/2018      Reactions   Rocephin [ceftriaxone Sodium In Dextrose] Anaphylaxis      Medication List        Accurate as of 03/23/18  2:21 PM. Always use your most recent med list.            albuterol 108 (90 Base) MCG/ACT inhaler Commonly known as:  PROVENTIL HFA;VENTOLIN HFA Inhale 2 puffs into the lungs every 6 (six) hours as needed for wheezing or shortness of breath.   ALPRAZolam 1 MG tablet Commonly known as:  XANAX Take 1 mg by mouth 4 (four) times daily.   azithromycin 250 MG tablet Commonly known as:  ZITHROMAX 2 pills on day 1, then 1 pill daily   ferrous sulfate 325 (65 FE) MG tablet Take 1 tablet (325 mg total) by mouth daily.   hydrOXYzine 25 MG tablet Commonly known as:  ATARAX/VISTARIL Take 1 tablet (25 mg total) by mouth every 6 (six) hours as needed for anxiety.   lamoTRIgine 25 MG tablet Commonly known as:  LAMICTAL Take 2 tablets (50 mg total) by mouth 2 (two) times daily. For mood control   predniSONE 10 MG tablet Commonly known as:  DELTASONE 3 pills daily for 2 days, 2 pills daily for 2 days, 1 pill daily for 2 days   risperiDONE 1 MG disintegrating tablet Commonly known as:  RISPERDAL M-TABS Take 1 mg by mouth 2 (two) times daily.       Past Surgical History:  She  has a past surgical history that includes laparoscopy (12/29/2011).  Family History:  Her family history includes Diabetes in her father; Heart disease in her father; High blood pressure in her father and mother.  Social History:  She  reports that she has been smoking cigarettes. She has a 11.50 pack-year smoking history. She has never used smokeless tobacco. She reports that she drinks about 18.0 standard drinks of alcohol per week. She reports that she has current or past drug history.

## 2018-03-23 NOTE — Patient Instructions (Signed)
Zithromax 250 mg pill >> 2 pills on day 1, then 1 pill daily for next 4 days Prednisone 10 mg pill >> 3 pills daily for 2 days, 2 pills daily for 2 days, 1 pill daily for 2 days Proair two puffs every 6 hours as needed for cough, wheeze, or chest congestion  Follow up in 2 weeks with Dr. Craige Cotta or Nurse Practitioner

## 2018-03-24 ENCOUNTER — Institutional Professional Consult (permissible substitution): Payer: Self-pay | Admitting: Pulmonary Disease

## 2018-04-04 ENCOUNTER — Ambulatory Visit: Payer: Self-pay | Admitting: Pulmonary Disease

## 2018-04-13 ENCOUNTER — Ambulatory Visit: Payer: Self-pay | Admitting: Pulmonary Disease

## 2018-05-11 ENCOUNTER — Other Ambulatory Visit: Payer: Self-pay

## 2018-05-11 ENCOUNTER — Encounter (HOSPITAL_BASED_OUTPATIENT_CLINIC_OR_DEPARTMENT_OTHER): Payer: Self-pay | Admitting: Emergency Medicine

## 2018-05-11 ENCOUNTER — Emergency Department (HOSPITAL_BASED_OUTPATIENT_CLINIC_OR_DEPARTMENT_OTHER)
Admission: EM | Admit: 2018-05-11 | Discharge: 2018-05-11 | Disposition: A | Discharge status: Home / Self Care | Payer: Self-pay | Attending: Emergency Medicine | Admitting: Emergency Medicine

## 2018-05-11 DIAGNOSIS — Z79899 Other long term (current) drug therapy: Secondary | ICD-10-CM | POA: Insufficient documentation

## 2018-05-11 DIAGNOSIS — F17210 Nicotine dependence, cigarettes, uncomplicated: Secondary | ICD-10-CM | POA: Insufficient documentation

## 2018-05-11 DIAGNOSIS — K029 Dental caries, unspecified: Secondary | ICD-10-CM | POA: Insufficient documentation

## 2018-05-11 DIAGNOSIS — F4481 Dissociative identity disorder: Secondary | ICD-10-CM | POA: Insufficient documentation

## 2018-05-11 DIAGNOSIS — F3189 Other bipolar disorder: Secondary | ICD-10-CM | POA: Insufficient documentation

## 2018-05-11 MED ORDER — NAPROXEN 500 MG PO TABS: 500.0000 mg | ORAL_TABLET | Freq: Two times a day (BID) | ORAL | 0 refills | Status: DC

## 2018-05-11 MED ORDER — ONDANSETRON 4 MG PO TBDP
4.0000 mg | ORAL_TABLET | Freq: Once | ORAL | Status: AC | PRN
  Administered 2018-05-11: 4 mg via ORAL
  Filled 2018-05-11: qty 1

## 2018-05-11 MED ORDER — PENICILLIN V POTASSIUM 500 MG PO TABS: 500.0000 mg | ORAL_TABLET | Freq: Four times a day (QID) | ORAL | 0 refills | Status: AC

## 2018-05-11 MED ORDER — IBUPROFEN 400 MG PO TABS
400.0000 mg | ORAL_TABLET | Freq: Once | ORAL | Status: AC | PRN
  Administered 2018-05-11: 400 mg via ORAL
  Filled 2018-05-11: qty 1

## 2018-05-11 NOTE — ED Notes (Signed)
Pt's nausea is resolved.

## 2018-05-11 NOTE — Discharge Instructions (Signed)
Please read instructions below. °Take the antibiotic, Penicillin V, 4 times per day until they are gone. °You can take Naproxen up to 2 times per day with meals, as needed for pain. °Schedule an appointment with your dentist. °Return to the ER for difficulty swallowing or breathing, fever, or new or worsening symptoms. ° ° °

## 2018-05-11 NOTE — ED Provider Notes (Signed)
[facility] EMERGENCY DEPARTMENT Provider Note   CSN: [id number] Arrival date & time: 05/11/18  2015     History   Chief Complaint Chief Complaint  Patient presents with  . Dental Pain    HPI Evelyn Lopez is a 37 y.o. female presenting to the emergency department with complaint of gradually worsening right lower dental pain for a couple of weeks.  Patient reports her right lower last molar has a hole.  Pain has been radiating into her jaw and ear.  She has been treating her pain with Tylenol without relief.  States she has a dental appointment on Monday, however could not be seen sooner.  Pt denies difficulty swallowing or breathing, fevers or chills, drainage in her mouth.  States she is allergic to Rocephin, however has had no issue with penicillins.  The history is provided by the patient.    Past Medical History:  Diagnosis Date  . Bipolar disorder (HCC)   . Chronic back pain   . Ectopic pregnancy    June 2013  . Multiple personalities (HCC)   . Ovarian cyst   . Panic attacks   . PTSD (post-traumatic stress disorder)   . Seizure disorder [facility](HCC)     Patient Active Problem List   Diagnosis Date Noted  . Panic disorder 08/02/2017  . Substance use disorder 08/02/2017  . Other bipolar disorder (HCC) 08/01/2017    Past Surgical History:  Procedure Laterality Date  . LAPAROSCOPY  12/29/2011   Procedure: LAPAROSCOPY OPERATIVE;  Surgeon: Tresa EndoKelly A. Ernestina PennaFogleman, MD;  Location: [facility];  Service: Gynecology;  Laterality: Bilateral;  Right Salpingectomy     OB History    Gravida  2   Para  0   Term  0   Preterm  0   AB  0   Living  0     SAB  0   TAB  0   Ectopic  0   Multiple  0   Live Births               Home Medications    Prior to Admission medications   Medication Sig Start Date End Date Taking? Authorizing Provider  albuterol (PROAIR HFA) 108 (90 Base) MCG/ACT inhaler Inhale 2 puffs into the lungs every 6 (six) hours as needed for  wheezing or shortness of breath. 03/23/18   Coralyn HellingSood, Vineet, MD  ALPRAZolam Prudy Feeler(XANAX) 1 MG tablet Take 1 mg by mouth 4 (four) times daily.    [provider]  azithromycin (ZITHROMAX) 250 MG tablet 2 pills on day 1, then 1 pill daily 03/23/18   Coralyn HellingSood, Vineet, MD  ferrous sulfate 325 (65 FE) MG tablet Take 1 tablet (325 mg total) by mouth daily. 10/14/16   Ward, Chase PicketJaime Pilcher, PA-C  hydrOXYzine (ATARAX/VISTARIL) 25 MG tablet Take 1 tablet (25 mg total) by mouth every 6 (six) hours as needed for anxiety. 08/05/17   Money, Gerlene Burdockravis B, FNP  lamoTRIgine (LAMICTAL) 25 MG tablet Take 2 tablets (50 mg total) by mouth 2 (two) times daily. For mood control 08/05/17   Money, Gerlene Burdockravis B, FNP  naproxen (NAPROSYN) 500 MG tablet Take 1 tablet (500 mg total) by mouth 2 (two) times daily. 05/11/18   , [place] N, PA-C  penicillin v potassium (VEETID) 500 MG tablet Take 1 tablet (500 mg total) by mouth 4 (four) times daily for 7 days. 05/11/18 05/18/18  , [place] N, PA-C  predniSONE (DELTASONE) 10 MG tablet 3 pills daily for 2 days,  2 pills daily for 2 days, 1 pill daily for 2 days 03/23/18   Coralyn Helling, MD  risperiDONE (RISPERDAL M-TABS) 1 MG disintegrating tablet Take 1 mg by mouth 2 (two) times daily.    [provider]    Family History Family History  Problem Relation Age of Onset  . High blood pressure Mother   . Diabetes Father   . Heart disease Father   . High blood pressure Father     Social History Social History   Tobacco Use  . Smoking status: Current Every Day Smoker    Packs/day: 0.50    Years: 23.00    Pack years: 11.50    Types: Cigarettes  . Smokeless tobacco: Never Used  Substance Use Topics  . Alcohol use: Yes    Alcohol/week: 18.0 standard drinks    Types: 10 Cans of beer, 8 Shots of liquor per week    Comment: ocassionally   . Drug use: Not Currently     Allergies   Rocephin [ceftriaxone sodium in dextrose]   Review of Systems Review of Systems    Constitutional: Negative for chills and fever.  HENT: Positive for dental problem. Negative for trouble swallowing and voice change.   Respiratory: Negative for stridor.      Physical Exam Updated Vital Signs BP (!) 142/98 (BP Location: Left Arm)   Pulse 73   Temp 98.2 F (36.8 C) (Oral)   Resp 20   Ht 5\' 9"  (1.753 m)   Wt 117.9 kg   LMP 05/04/2018   SpO2 96%   BMI 38.40 kg/m   Physical Exam  Constitutional: She appears well-developed and well-nourished. No distress.  HENT:  Head: Normocephalic and atraumatic.  Mouth/Throat: Uvula is midline. No trismus in the jaw. No uvula swelling. No posterior oropharyngeal edema or posterior oropharyngeal erythema.  No sublingual edema or tenderness.  There is a to the right lower last molar along the medial aspect with associated tenderness.  Mild gingival erythema, however without fluctuance.  No drainage.  Pharynx is clear.  Tolerating secretions.  Eyes: Conjunctivae are normal.  Neck: Normal range of motion. Neck supple.  Cardiovascular: Normal rate.  Pulmonary/Chest: Effort normal.  Lymphadenopathy:    She has no cervical adenopathy.  Psychiatric: She has a normal mood and affect. Her behavior is normal.  Nursing note and vitals reviewed.    ED Treatments / Results  Labs (all labs ordered are listed, but only abnormal results are displayed) Labs Reviewed - No data to display  EKG None  Radiology No results found.  Procedures Procedures (including critical care time)  Medications Ordered in ED Medications  ondansetron (ZOFRAN-ODT) disintegrating tablet 4 mg (4 mg Oral Given 05/11/18 2145)  ibuprofen (ADVIL,MOTRIN) tablet 400 mg (400 mg Oral Given 05/11/18 2145)     Initial Impression / Assessment and Plan / ED Course  I have reviewed the triage vital signs and the nursing notes.  Pertinent labs & imaging results that were available during my care of the patient were reviewed by me and considered in my medical  decision making (see chart for details).    Patient with dental caries.  No gross abscess.  VSS, afebrile, tolerating secretions. Exam unconcerning for peritonsillar abscess, Ludwig's angina or spread of infection.  Offered dental block, however patient declined.  Pain treated in the emergency department with ibuprofen.  Will treat with penicillin and pain medicine.  Patient reports she has appointment on Monday with her dentist.  Return precautions discussed.  Pt safe for discharge.  Discussed results, findings, treatment and follow up. Patient advised of return precautions. Patient verbalized understanding and agreed with plan.  Final Clinical Impressions(s) / ED Diagnoses   Final diagnoses:  Pain due to dental caries    ED Discharge Orders         Ordered    penicillin v potassium (VEETID) 500 MG tablet  4 times daily     05/11/18 2315    naproxen (NAPROSYN) 500 MG tablet  2 times daily     05/11/18 2316           , [place] N, PA-C 05/11/18 2324    Little, Ambrose Finland, MD 05/16/18 [phone]

## 2018-05-11 NOTE — ED Notes (Signed)
ED Provider at bedside. 

## 2018-05-11 NOTE — ED Notes (Signed)
Pt states she is nauseated at this time.

## 2018-05-11 NOTE — ED Notes (Signed)
Pt teaching provided on medications that may cause drowsiness. Pt instructed not to drive or operate heavy machinery while taking the prescribed medication. Pt verbalized understanding.   

## 2018-05-11 NOTE — ED Triage Notes (Signed)
Pt c/o 10/10 right lower dental pain for the past 4 hours took some Tylenol with no relief.

## 2018-08-24 ENCOUNTER — Telehealth: Payer: Self-pay | Admitting: Pulmonary Disease

## 2018-08-24 MED ORDER — BUDESONIDE-FORMOTEROL FUMARATE 160-4.5 MCG/ACT IN AERO: 2.0000 | INHALATION_SPRAY | Freq: Two times a day (BID) | RESPIRATORY_TRACT | 0 refills | Status: DC

## 2018-08-24 NOTE — Telephone Encounter (Signed)
Patient called in and stated that she has been sick for the last 3 weeks with a productive cough consisting of yellow mucus. Runny nose, not feeling well at all. Patient is coming in to see TP in the [facility] location next week VS stated to provide her samples with symbicort 160 today 08/24/18.

## 2018-12-07 IMAGING — CT CT RENAL STONE PROTOCOL
2 of 4 series · 16 of 46 positions shown, 18 images · non-contrast
Comparison: 10/14/2016

CLINICAL DATA: Left flank pain and pelvic pain.  Hematuria.

EXAM:
CT ABDOMEN AND PELVIS WITHOUT CONTRAST
TECHNIQUE: Multidetector CT imaging of the abdomen and pelvis was performed
following the standard protocol without IV contrast.

[Series 2: axial st · axial · 0.83mm/px · z∈[-486,-46]mm · 13 of 96 slices shown, 15 images]
[im 4/96  soft-tissue]
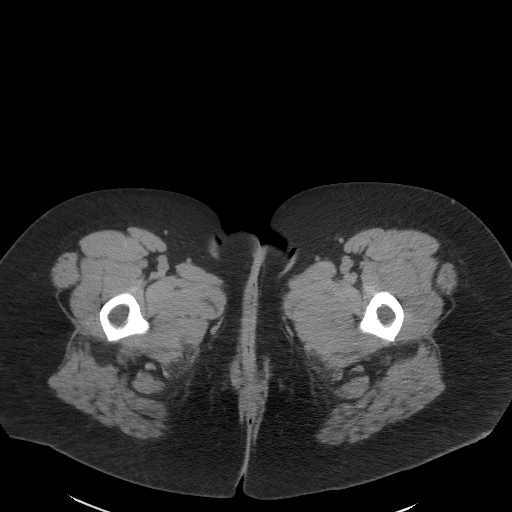
[im 4/96  bone]
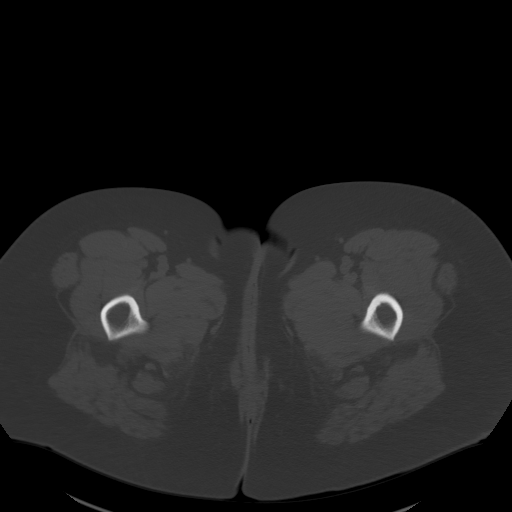
[im 11/96  soft-tissue]
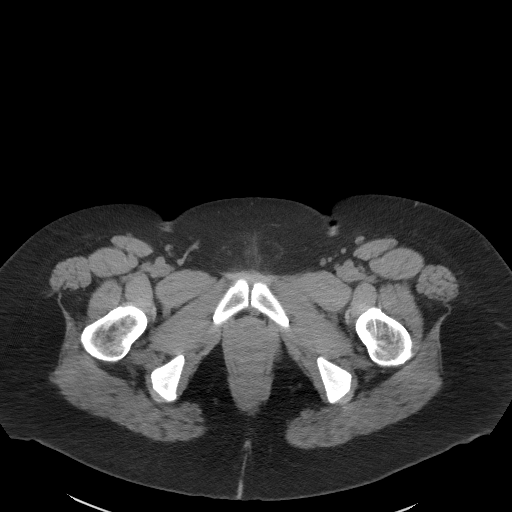
[im 19/96  soft-tissue]
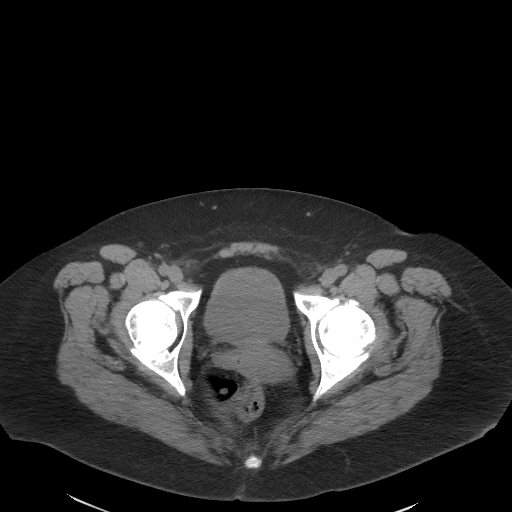
[im 26/96  soft-tissue]
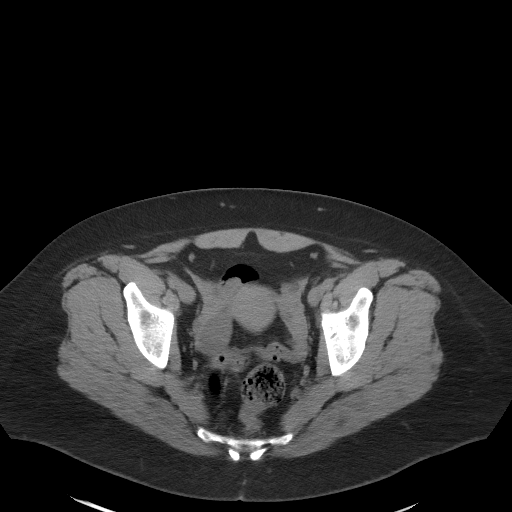
[im 33/96  soft-tissue]
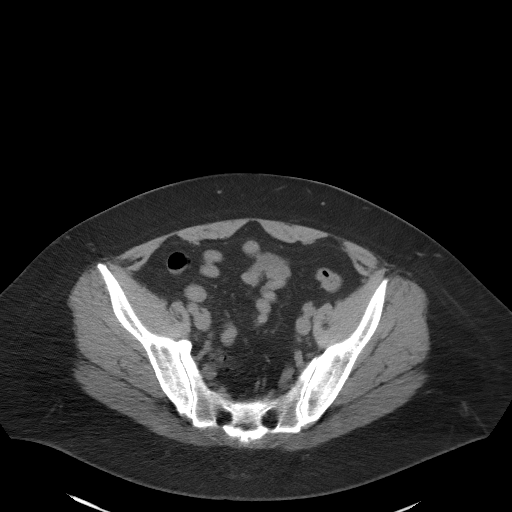
[im 41/96  soft-tissue]
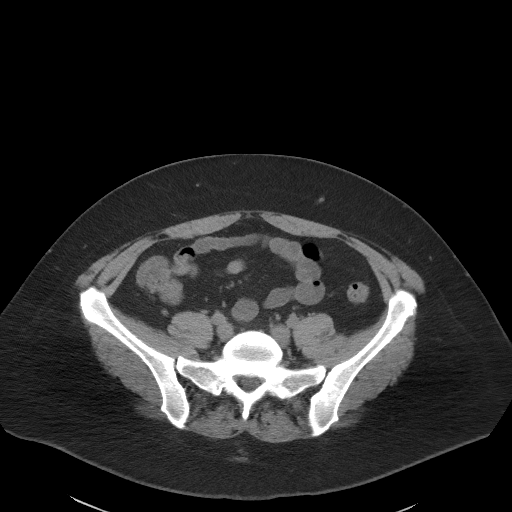
[im 48/96  soft-tissue]
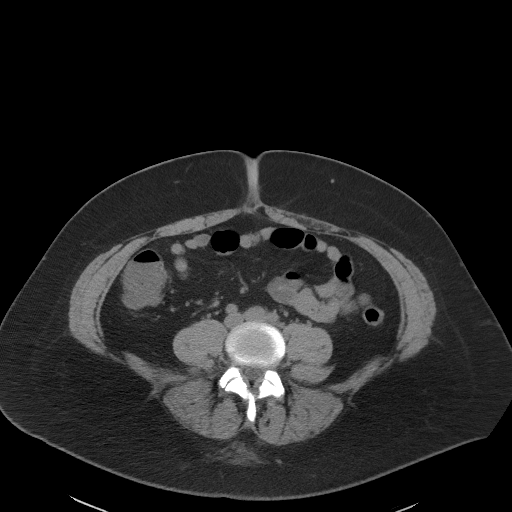
[im 55/96  soft-tissue]
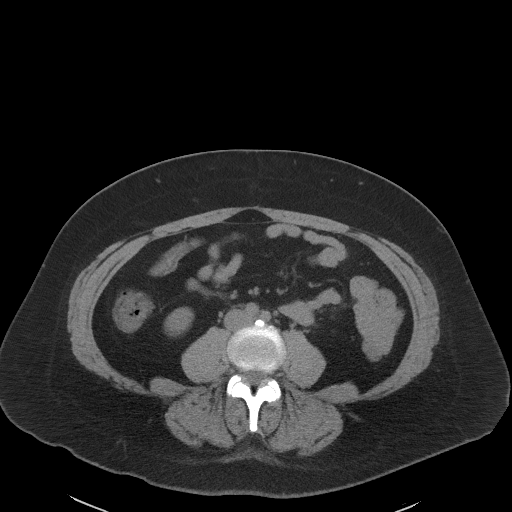
[im 63/96  soft-tissue]
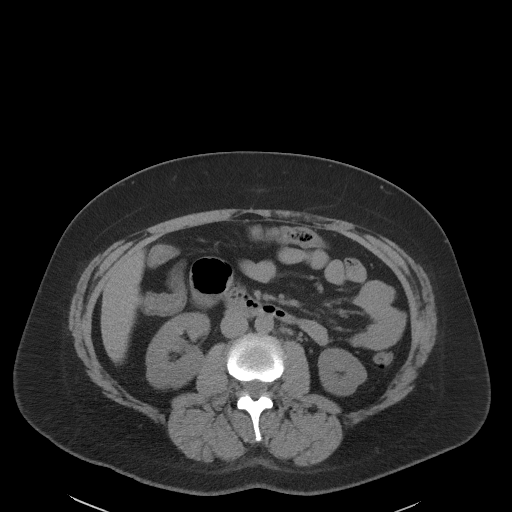
[im 63/96  bone]
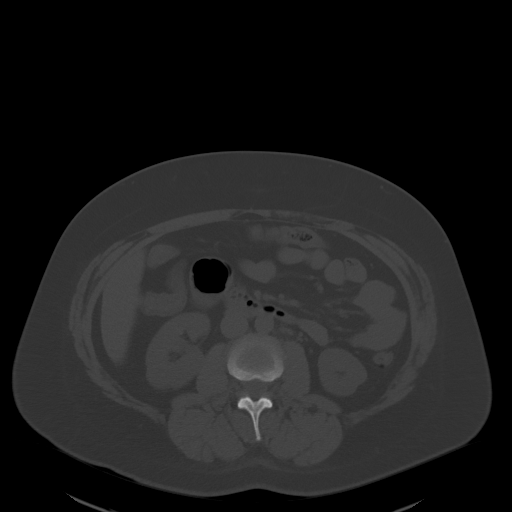
[im 70/96  soft-tissue]
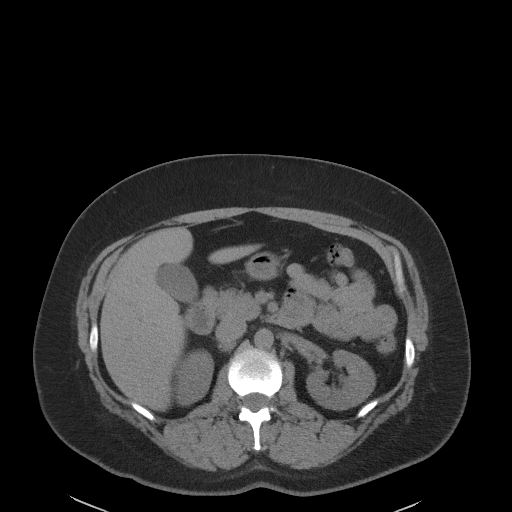
[im 77/96  soft-tissue]
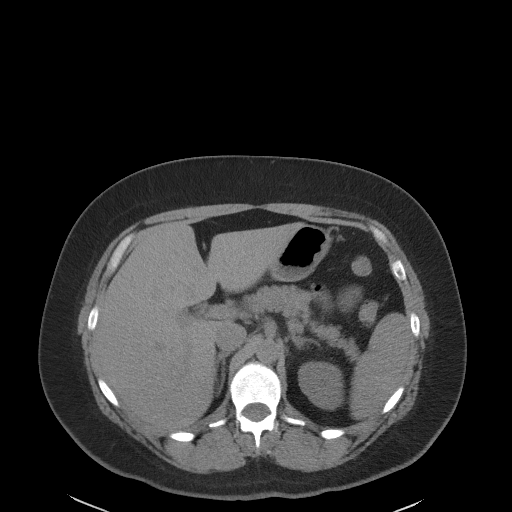
[im 85/96  soft-tissue]
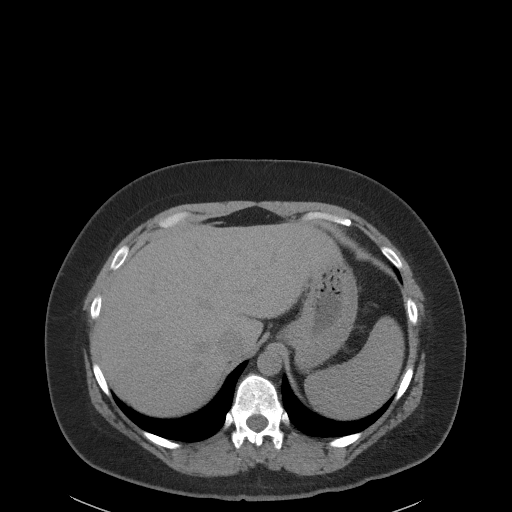
[im 92/96  soft-tissue]
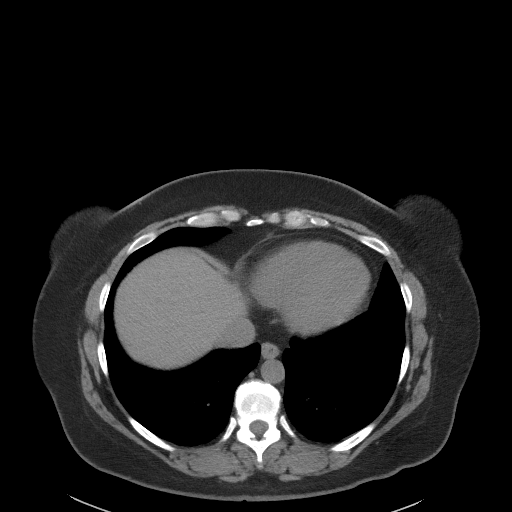

[Series 5: coronal st · coronal · 0.84mm/px · 3 of 91 slices shown]
[im 31/91  soft-tissue]
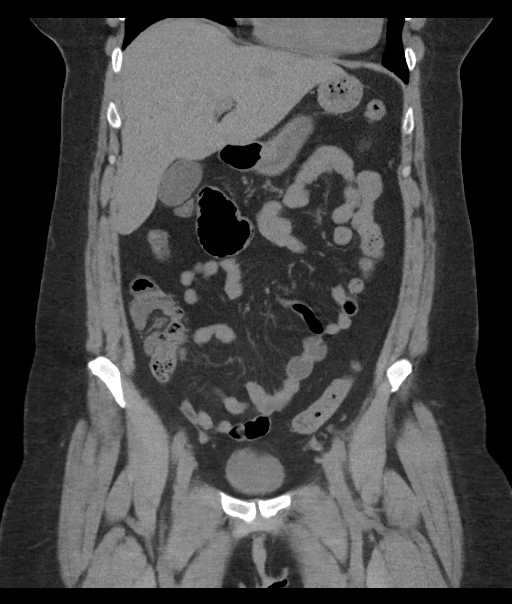
[im 41/91  soft-tissue]
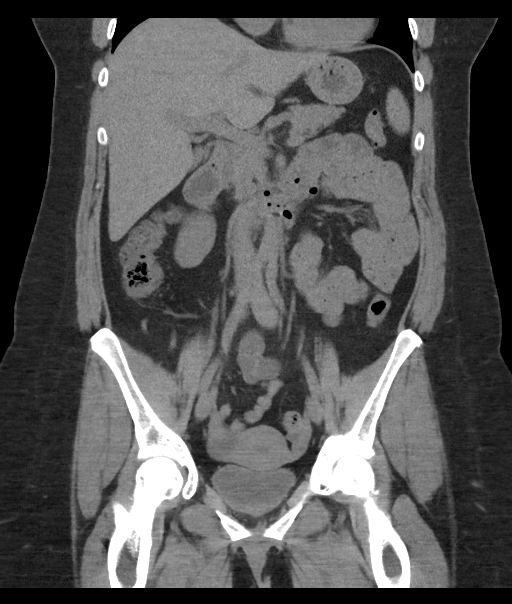
[im 51/91  soft-tissue]
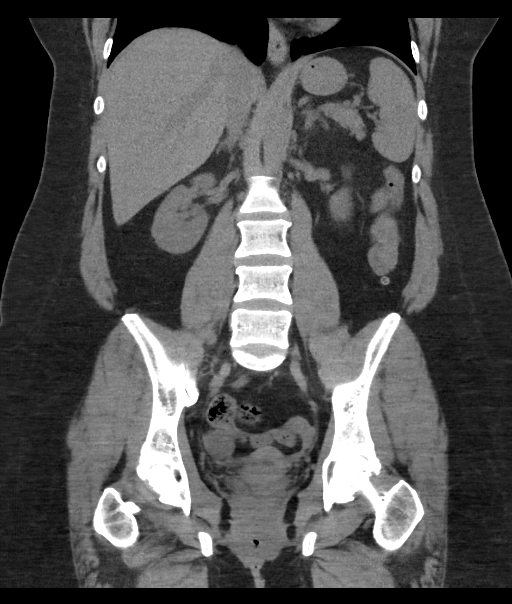

[16 of 46 positions shown; findings below may reference images not displayed]

FINDINGS: Lower chest: The visualized lung bases are clear.

Hepatobiliary: The hepatic dome was incompletely imaged. A 3 mm
low-density lesion in the posterior right hepatic lobe is too small
to fully characterize. The gallbladder is unremarkable. There is no
biliary dilatation.

Pancreas: Unremarkable.

Spleen: Unremarkable.

Adrenals/Urinary Tract: Unremarkable adrenal glands. No evidence of
renal mass on this unenhanced examination. Nonobstructing 2 mm
calculus in the lower pole of the left kidney. No ureteral calculi
or hydroureteronephrosis. Unremarkable bladder.

Stomach/Bowel: The stomach is within normal limits. No bowel
dilatation. Scattered left-sided colonic diverticula without
evidence of diverticulitis. Unremarkable appendix.

Vascular/Lymphatic: Normal caliber of the abdominal aorta. No
enlarged lymph nodes.

Reproductive: 3.9 x 2.7 cm low-density right ovarian structure is
consistent with a cyst. Unremarkable uterus and left ovary.

Other: No intraperitoneal free fluid. No abdominal wall mass or
hernia.

Musculoskeletal: No acute osseous abnormality or suspicious osseous
lesion. Mild degenerative spurring at the hips.
IMPRESSION: 1. Nonobstructing left renal calculus.
2. 3.9 cm benign-appearing right ovarian cyst. No follow-up
recommended. This recommendation follows ACR consensus guidelines:
White Paper of the ACR Incidental Findings Committee II on Adnexal
Findings. [HOSPITAL] [DATE].

## 2019-01-15 ENCOUNTER — Emergency Department (HOSPITAL_COMMUNITY)
Admission: EM | Admit: 2019-01-15 | Discharge: 2019-01-16 | Disposition: A | Discharge status: Home / Self Care | Payer: Self-pay | Attending: Emergency Medicine | Admitting: Emergency Medicine

## 2019-01-15 ENCOUNTER — Emergency Department (HOSPITAL_COMMUNITY): Payer: Self-pay

## 2019-01-15 ENCOUNTER — Other Ambulatory Visit: Payer: Self-pay

## 2019-01-15 DIAGNOSIS — F17210 Nicotine dependence, cigarettes, uncomplicated: Secondary | ICD-10-CM | POA: Insufficient documentation

## 2019-01-15 DIAGNOSIS — N83201 Unspecified ovarian cyst, right side: Secondary | ICD-10-CM | POA: Insufficient documentation

## 2019-01-15 DIAGNOSIS — R1031 Right lower quadrant pain: Secondary | ICD-10-CM | POA: Insufficient documentation

## 2019-01-15 LAB — POCT I-STAT EG7
Acid-base deficit: 1 mmol/L (ref 0.0–2.0)
Bicarbonate: 23.4 mmol/L (ref 20.0–28.0)
Calcium, Ion: 1.13 mmol/L — ABNORMAL LOW (ref 1.15–1.40)
HCT: 32 % — ABNORMAL LOW (ref 36.0–46.0)
Hemoglobin: 10.9 g/dL — ABNORMAL LOW (ref 12.0–15.0)
O2 Saturation: 98 %
Potassium: 3.9 mmol/L (ref 3.5–5.1)
Sodium: 139 mmol/L (ref 135–145)
TCO2: 25 mmol/L (ref 22–32)
pCO2, Ven: 37 mmHg — ABNORMAL LOW (ref 44.0–60.0)
pH, Ven: 7.41 (ref 7.250–7.430)
pO2, Ven: 105 mmHg — ABNORMAL HIGH (ref 32.0–45.0)

## 2019-01-15 LAB — COMPREHENSIVE METABOLIC PANEL
ALT: 57 U/L — ABNORMAL HIGH (ref 0–44)
AST: 85 U/L — ABNORMAL HIGH (ref 15–41)
Albumin: 3.6 g/dL (ref 3.5–5.0)
Alkaline Phosphatase: 90 U/L (ref 38–126)
Anion gap: 10 (ref 5–15)
BUN: 13 mg/dL (ref 6–20)
CO2: 22 mmol/L (ref 22–32)
Calcium: 9 mg/dL (ref 8.9–10.3)
Chloride: 105 mmol/L (ref 98–111)
Creatinine, Ser: 0.97 mg/dL (ref 0.44–1.00)
GFR calc Af Amer: >60 mL/min (ref >60)
GFR calc non Af Amer: >60 mL/min (ref >60)
Glucose, Bld: 121 mg/dL — ABNORMAL HIGH (ref 70–99)
Potassium: 3.9 mmol/L (ref 3.5–5.1)
Sodium: 137 mmol/L (ref 135–145)
Total Bilirubin: 0.6 mg/dL (ref 0.3–1.2)
Total Protein: 7 g/dL (ref 6.5–8.1)

## 2019-01-15 LAB — CBC
HCT: 31.3 % — ABNORMAL LOW (ref 36.0–46.0)
Hemoglobin: 9.1 g/dL — ABNORMAL LOW (ref 12.0–15.0)
MCH: 21.5 pg — ABNORMAL LOW (ref 26.0–34.0)
MCHC: 29.1 g/dL — ABNORMAL LOW (ref 30.0–36.0)
MCV: 74 fL — ABNORMAL LOW (ref 80.0–100.0)
Platelets: 430 10*3/uL — ABNORMAL HIGH (ref 150–400)
RBC: 4.23 MIL/uL (ref 3.87–5.11)
RDW: 16.7 % — ABNORMAL HIGH (ref 11.5–15.5)
WBC: 8 10*3/uL (ref 4.0–10.5)
nRBC: 0 % (ref 0.0–0.2)

## 2019-01-15 LAB — I-STAT BETA HCG BLOOD, ED (MC, WL, AP ONLY): I-stat hCG, quantitative: <5 m[IU]/mL (ref <5)

## 2019-01-15 LAB — LIPASE, BLOOD: Lipase: 44 U/L (ref 11–51)

## 2019-01-15 MED ORDER — HYDROMORPHONE HCL 1 MG/ML IJ SOLN
1.0000 mg | Freq: Once | INTRAMUSCULAR | Status: AC
  Administered 2019-01-15: 1 mg via INTRAVENOUS
  Filled 2019-01-15: qty 1

## 2019-01-15 MED ORDER — SODIUM CHLORIDE 0.9 % IV BOLUS
500.0000 mL | Freq: Once | INTRAVENOUS | Status: AC
  Administered 2019-01-15: 500 mL via INTRAVENOUS

## 2019-01-15 MED ORDER — ONDANSETRON HCL 4 MG/2ML IJ SOLN: 4.0000 mg | Freq: Once | INTRAMUSCULAR | Status: DC | PRN

## 2019-01-15 MED ORDER — SODIUM CHLORIDE 0.9 % IV SOLN: INTRAVENOUS | Status: DC

## 2019-01-15 MED ORDER — IOHEXOL 300 MG/ML  SOLN
125.0000 mL | Freq: Once | INTRAMUSCULAR | Status: AC | PRN
  Administered 2019-01-15: 125 mL via INTRAVENOUS

## 2019-01-15 MED ORDER — SODIUM CHLORIDE 0.9% FLUSH: 3.0000 mL | Freq: Once | INTRAVENOUS | Status: DC

## 2019-01-15 MED ORDER — ONDANSETRON HCL 4 MG/2ML IJ SOLN
4.0000 mg | Freq: Once | INTRAMUSCULAR | Status: AC
  Administered 2019-01-15: 4 mg via INTRAVENOUS
  Filled 2019-01-15: qty 2

## 2019-01-15 NOTE — ED Provider Notes (Signed)
[facility] EMERGENCY DEPARTMENT Provider Note   CSN: [id number] Arrival date & time: 01/15/19  2114    History   Chief Complaint Chief Complaint  Patient presents with  . Abdominal Pain  . Emesis    HPI Evelyn Lopez is a 38 y.o. female.     [facility]  States she has had some discomfort periumbilical and right side of abdomen for 2 weeks but got worse here to tonight at around 8 PM.  Sudden onset of right lower quadrant pain.  About an hour prior to arrival.  Per EMS patient was seen by her gynecologist last night they ruled out endometriosis but they did do a biopsy.  Of a cyst on the right side of the uterus a week ago.  But patient states that this pain is quite severe.  Past medical history significant for bipolar chronic back pain past history of ectopic pregnancy and posttraumatic stress disorder.     Past Medical History:  Diagnosis Date  . Bipolar disorder (HCC)   . Chronic back pain   . Ectopic pregnancy    June 2013  . Multiple personalities (HCC)   . Ovarian cyst   . Panic attacks   . PTSD (post-traumatic stress disorder)   . Seizure disorder [facility](HCC)     Patient Active Problem List   Diagnosis Date Noted  . Panic disorder 08/02/2017  . Substance use disorder 08/02/2017  . Other bipolar disorder (HCC) 08/01/2017    Past Surgical History:  Procedure Laterality Date  . LAPAROSCOPY  12/29/2011   Procedure: LAPAROSCOPY OPERATIVE;  Surgeon: Tresa EndoKelly A. Ernestina PennaFogleman, MD;  Location: [facility];  Service: Gynecology;  Laterality: Bilateral;  Right Salpingectomy     OB History    Gravida  2   Para  0   Term  0   Preterm  0   AB  0   Living  0     SAB  0   TAB  0   Ectopic  0   Multiple  0   Live Births               Home Medications    Prior to Admission medications   Medication Sig Start Date End Date Taking? Authorizing Provider  albuterol (PROAIR HFA) 108 (90 Base) MCG/ACT inhaler Inhale 2  puffs into the lungs every 6 (six) hours as needed for wheezing or shortness of breath. 03/23/18  Yes Coralyn HellingSood, Vineet, MD  ALPRAZolam Prudy Feeler(XANAX) 1 MG tablet Take 1 mg by mouth 4 (four) times daily.   Yes [provider]  hydrOXYzine (ATARAX/VISTARIL) 25 MG tablet Take 1 tablet (25 mg total) by mouth every 6 (six) hours as needed for anxiety. Patient taking differently: Take 50 mg by mouth at bedtime.  08/05/17  Yes Money, Gerlene Burdockravis B, FNP  lamoTRIgine (LAMICTAL) 25 MG tablet Take 2 tablets (50 mg total) by mouth 2 (two) times daily. For mood control 08/05/17  Yes Money, Gerlene Burdockravis B, FNP  risperiDONE (RISPERDAL M-TABS) 1 MG disintegrating tablet Take 1 mg by mouth 2 (two) times daily.   Yes [provider]  budesonide-formoterol (SYMBICORT) 160-4.5 MCG/ACT inhaler Inhale 2 puffs into the lungs 2 (two) times daily. Patient not taking: Reported on 01/15/2019 08/24/18   Coralyn HellingSood, Vineet, MD  ferrous sulfate 325 (65 FE) MG tablet Take 1 tablet (325 mg total) by mouth daily. Patient not taking: Reported on 01/15/2019 10/14/16   Ward, Chase PicketJaime Pilcher, PA-C  HYDROcodone-acetaminophen (NORCO/VICODIN)  5-325 MG tablet Take 1 tablet by mouth every 6 (six) hours as needed for moderate pain. 01/16/19   Vanetta MuldersZackowski, Cyndee Giammarco, MD  naproxen (NAPROSYN) 500 MG tablet Take 1 tablet (500 mg total) by mouth 2 (two) times daily. Patient not taking: Reported on 01/15/2019 05/11/18   Robinson, [place] N, PA-C  predniSONE (DELTASONE) 10 MG tablet 3 pills daily for 2 days, 2 pills daily for 2 days, 1 pill daily for 2 days Patient not taking: Reported on 01/15/2019 03/23/18   Coralyn HellingSood, Vineet, MD    Family History Family History  Problem Relation Age of Onset  . High blood pressure Mother   . Diabetes Father   . Heart disease Father   . High blood pressure Father     Social History Social History   Tobacco Use  . Smoking status: Current Every Day Smoker    Packs/day: 0.50    Years: 23.00    Pack years: 11.50    Types: Cigarettes  .  Smokeless tobacco: Never Used  Substance Use Topics  . Alcohol use: Yes    Alcohol/week: 18.0 standard drinks    Types: 10 Cans of beer, 8 Shots of liquor per week    Comment: ocassionally   . Drug use: Not Currently     Allergies   Rocephin [ceftriaxone sodium in dextrose]   Review of Systems Review of Systems  Constitutional: Negative for chills and fever.  HENT: Negative for congestion, rhinorrhea and sore throat.   Eyes: Negative for visual disturbance.  Respiratory: Negative for cough and shortness of breath.   Cardiovascular: Negative for chest pain and leg swelling.  Gastrointestinal: Positive for abdominal pain and nausea. Negative for diarrhea and vomiting.  Genitourinary: Negative for dysuria.  Musculoskeletal: Negative for back pain and neck pain.  Skin: Negative for rash.  Neurological: Negative for dizziness, light-headedness and headaches.  Hematological: Does not bruise/bleed easily.  Psychiatric/Behavioral: Negative for confusion.     Physical Exam Updated Vital Signs BP 133/88   Pulse 85   Resp 16   SpO2 100%   Physical Exam Vitals signs and nursing note reviewed.  Constitutional:      General: She is not in acute distress.    Appearance: Normal appearance. She is well-developed.  HENT:     Head: Normocephalic and atraumatic.  Eyes:     Extraocular Movements: Extraocular movements intact.     Conjunctiva/sclera: Conjunctivae normal.     Pupils: Pupils are equal, round, and reactive to light.  Neck:     Musculoskeletal: Normal range of motion and neck supple.  Cardiovascular:     Rate and Rhythm: Normal rate and regular rhythm.     Heart sounds: No murmur.  Pulmonary:     Effort: Pulmonary effort is normal. No respiratory distress.     Breath sounds: Normal breath sounds.  Abdominal:     Palpations: Abdomen is soft.     Tenderness: There is abdominal tenderness.     Comments: Some tenderness the right lower quadrant no guarding.  Skin:     General: Skin is warm and dry.  Neurological:     General: No focal deficit present.     Mental Status: She is alert and oriented to person, place, and time.      ED Treatments / Results  Labs (all labs ordered are listed, but only abnormal results are displayed) Labs Reviewed  COMPREHENSIVE METABOLIC PANEL - Abnormal; Notable for the following components:      Result Value  Glucose, Bld 121 (*)    AST 85 (*)    ALT 57 (*)    All other components within normal limits  CBC - Abnormal; Notable for the following components:   Hemoglobin 9.1 (*)    HCT 31.3 (*)    MCV 74.0 (*)    MCH 21.5 (*)    MCHC 29.1 (*)    RDW 16.7 (*)    Platelets 430 (*)    All other components within normal limits  POCT I-STAT EG7 - Abnormal; Notable for the following components:   pCO2, Ven 37.0 (*)    pO2, Ven 105.0 (*)    Calcium, Ion 1.13 (*)    HCT 32.0 (*)    Hemoglobin 10.9 (*)    All other components within normal limits  LIPASE, BLOOD  URINALYSIS, ROUTINE W REFLEX MICROSCOPIC  I-STAT BETA HCG BLOOD, ED ([facility], [facility], [facility] ONLY)  I-STAT BETA HCG BLOOD, ED ([facility], [facility], [facility] ONLY)    EKG EKG Interpretation  Date/Time:  Sunday January 15 2019 21:29:20 EDT Ventricular Rate:  94 PR Interval:    QRS Duration: 91 QT Interval:  375 QTC Calculation: 469 R Axis:   45 Text Interpretation:  Sinus rhythm Low voltage, precordial leads Nonspecific T abnormalities, anterior leads Confirmed by Vanetta MuldersZackowski, Cambrie Sonnenfeld [phone]([id number]) on 01/15/2019 10:17:05 PM   Radiology Ct Abdomen Pelvis W Contrast  Result Date: 01/15/2019 CLINICAL DATA:  Appendicitis suspected. Lower abdominal pain radiating into right flank. EXAM: CT ABDOMEN AND PELVIS WITH CONTRAST TECHNIQUE: Multidetector CT imaging of the abdomen and pelvis was performed using the standard protocol following bolus administration of intravenous contrast. CONTRAST:  125mL OMNIPAQUE IOHEXOL 300 MG/ML  SOLN COMPARISON:  05/28/2017 FINDINGS: Lower chest: No acute abnormality.  Hepatobiliary: Diffuse hepatic steatosis. Prominent lateral segment of left lobe of liver and caudate lobe of liver noted. No focal liver abnormality. Gallbladder normal. No biliary dilatation. Pancreas: Unremarkable. No pancreatic ductal dilatation or surrounding inflammatory changes. Spleen: Normal in size without focal abnormality. Adrenals/Urinary Tract: Adrenal glands are unremarkable. Kidneys are normal, without renal calculi, focal lesion, or hydronephrosis. Bladder is unremarkable. Stomach/Bowel: Stomach is within normal limits. Appendix appears normal. No evidence of bowel wall thickening, distention, or inflammatory changes. Scattered left-sided colonic diverticula without acute inflammation. Vascular/Lymphatic: No significant vascular findings are present. No enlarged abdominal or pelvic lymph nodes. Reproductive: Uterus appears normal. Unremarkable appearance of the left ovary. Right ovary cyst measures 3 cm. Other: No free fluid or fluid collections. Musculoskeletal: No acute or significant osseous findings. IMPRESSION: 1. No acute findings within the abdomen or pelvis 2. Diffuse hepatic steatosis with relative enlargement of the caudate lobe and lateral segment of left lobe of liver. Findings may be seen with early cirrhosis. 3. Right ovary cyst measures 3 cm. This is almost certainly benign, and no specific imaging follow up is recommended according to the Society of Radiologists in Ultrasound2010 Consensus Conference Statement (D Lenis NoonLevine et al. Management of Asymptomatic Ovarian and Other Adnexal Cysts Imaged at US: Society of Radiologists in Ultrasound Consensus Conference Statement 2010. Radiology 256 (Sept 2010): 943-954.). Electronically Signed   By: Signa Kellaylor  Stroud M.D.   On: 01/15/2019 23:45    Procedures Procedures (including critical care time)  Medications Ordered in ED Medications  sodium chloride flush (NS) 0.9 % injection 3 mL (has no administration in time range)  ondansetron  (ZOFRAN) injection 4 mg (has no administration in time range)  0.9 %  sodium chloride infusion (has no administration in time range)  sodium  chloride 0.9 % bolus 500 mL (500 mLs Intravenous New Bag/Given 01/15/19 2245)  ondansetron (ZOFRAN) injection 4 mg (4 mg Intravenous Given 01/15/19 2246)  HYDROmorphone (DILAUDID) injection 1 mg (1 mg Intravenous Given 01/15/19 2247)  iohexol (OMNIPAQUE) 300 MG/ML solution 125 mL (125 mLs Intravenous Contrast Given 01/15/19 2313)     Initial Impression / Assessment and Plan / ED Course  I have reviewed the triage vital signs and the nursing notes.  Pertinent labs & imaging results that were available during my care of the patient were reviewed by me and considered in my medical decision making (see chart for details).       CT scan without any acute findings other than evidence of a right ovarian cyst measuring 3 cm.  But nothing optically abnormal about that.  This could explain the patient's pain otherwise no explanation for the pain.  Will give a short course of hydrocodone have her follow back up with OB/GYN.   Final Clinical Impressions(s) / ED Diagnoses   Final diagnoses:  Right lower quadrant abdominal pain  Cyst of right ovary    ED Discharge Orders         Ordered    HYDROcodone-acetaminophen (NORCO/VICODIN) 5-325 MG tablet  Every 6 hours PRN     01/16/19 0017           Fredia Sorrow, MD 01/16/19 0023

## 2019-01-15 NOTE — ED Notes (Signed)
I-stat EG7 information incorrectly placed in pt's chart. Mini lab to place correction form

## 2019-01-15 NOTE — ED Notes (Addendum)
Istat POCT eg7 ran and processed in error. Test resulted at 2216.  I will have POC main lab credit pt's account.

## 2019-01-15 NOTE — ED Notes (Signed)
Pt began to hyperventilate due to pain from abd and chest area. MD notified. Awaiting orders. Placed pt on 2L [place]

## 2019-01-15 NOTE — ED Notes (Signed)
Patient transported to CT 

## 2019-01-15 NOTE — ED Triage Notes (Signed)
Pt brought in by [facility] from home for sudden onset of sharp RLQ pain x1 hour ago. Per EMS pt was seen by her gynecologist last week, ruled out endometriosis and an ectopic pregnancy. Pt endorses rebound tenderness. Pt had a biopsy of a cyst on the right side of her uterus x1 week ago. Pt endorses NV, pt afebrile.

## 2019-01-16 MED ORDER — HYDROCODONE-ACETAMINOPHEN 5-325 MG PO TABS: 1.0000 | ORAL_TABLET | Freq: Four times a day (QID) | ORAL | 0 refills | Status: DC | PRN

## 2019-01-16 NOTE — Discharge Instructions (Addendum)
Take the pain medication as directed.  Make an appointment to follow back up with your OB/GYN doctor.  Symptoms could be consistent with the finding of the right ovarian cyst.

## 2019-01-16 NOTE — ED Notes (Signed)
Patient verbalizes understanding of discharge instructions. Opportunity for questioning and answers were provided. Armband removed by staff, pt discharged from ED ambulatory with mother to pick up pt

## 2019-02-15 ENCOUNTER — Encounter: Payer: Self-pay | Admitting: Nurse Practitioner

## 2019-02-15 ENCOUNTER — Ambulatory Visit (INDEPENDENT_AMBULATORY_CARE_PROVIDER_SITE_OTHER): Payer: Self-pay | Admitting: Nurse Practitioner

## 2019-02-15 ENCOUNTER — Other Ambulatory Visit: Payer: Self-pay

## 2019-02-15 ENCOUNTER — Other Ambulatory Visit (INDEPENDENT_AMBULATORY_CARE_PROVIDER_SITE_OTHER): Payer: Self-pay

## 2019-02-15 VITALS — BP 120/78 | HR 100 | Temp 97.9°F | Ht 69.0 in | Wt 306.8 lb

## 2019-02-15 DIAGNOSIS — E669 Obesity, unspecified: Secondary | ICD-10-CM

## 2019-02-15 DIAGNOSIS — K219 Gastro-esophageal reflux disease without esophagitis: Secondary | ICD-10-CM

## 2019-02-15 DIAGNOSIS — K760 Fatty (change of) liver, not elsewhere classified: Secondary | ICD-10-CM

## 2019-02-15 DIAGNOSIS — R195 Other fecal abnormalities: Secondary | ICD-10-CM

## 2019-02-15 DIAGNOSIS — R112 Nausea with vomiting, unspecified: Secondary | ICD-10-CM

## 2019-02-15 LAB — FOLATE: Folate: 8.4 ng/mL (ref >5.9)

## 2019-02-15 LAB — IBC + FERRITIN
Ferritin: 5.2 ng/mL — ABNORMAL LOW (ref 10.0–291.0)
Iron: 25 ug/dL — ABNORMAL LOW (ref 42–145)
Saturation Ratios: 5 % — ABNORMAL LOW (ref 20.0–50.0)
Transferrin: 358 mg/dL (ref 212.0–360.0)

## 2019-02-15 LAB — VITAMIN B12: Vitamin B-12: 304 pg/mL (ref 211–911)

## 2019-02-15 LAB — PROTIME-INR
INR: 1 ratio (ref 0.8–1.0)
Prothrombin Time: 12.1 s (ref 9.6–13.1)

## 2019-02-15 NOTE — Patient Instructions (Addendum)
If you are age 39 or older, your body mass index should be between 23-30. Your Body mass index is 45.31 kg/m. If this is out of the aforementioned range listed, please consider follow up with your Primary Care Provider.  If you are age 73 or younger, your body mass index should be between 19-25. Your Body mass index is 45.31 kg/m. If this is out of the aformentioned range listed, please consider follow up with your Primary Care Provider.   Your provider has requested that you go to the basement level for lab work before leaving today. Press "B" on the elevator. The lab is located at the first door on the left as you exit the elevator.  Continue over-the-counter Omeprazole every morning.  Start Align once daily for 30 days (this is over-the-counter)  If this helps, continue.  Work on weight loss.  DISCONTINUE alcohol.  We will call you with results.  Please call the office for an appointment in four weeks as the schedule is nto available at this time.  Thank you for choosing me and [facility].   Tye Savoy, NP

## 2019-02-15 NOTE — Progress Notes (Signed)
ASSESSMENT / PLAN:   1. 38 yo female with a one year history of loose stools, right sided abdominal pain, nausea / vomiting. She had an EGD / colonoscopy with TI intubation done May 2018 at [facility] for GERD, lower abdominal pain, hematochezia, constipation and iron deficiency anemia. Summaries in Care Everywhere, full reports requested. Other than antral ulcerations and mild reflux esophagitis the EGD was unremarkable. Pandiverticulosis and hemorrhoids found on otherwise normal colonoscopy. Her bowel habits have changed from being constipated in 2018 to now having loose stool over the last year. Her symptoms could be secondary to IBS. Also wonder about role of Etoh in her GI symptoms as loose stools get worse after drinking liquor.  -Discussed steatosis and its potential to lead to cirrhosis, especially with concurrent heavy Etoh use. She started crying, agrees to stop drinking Etoh. Findings of early cirrhosis on CT scan has motivated her. I would like to see which, if any of her GI symptoms persist after discontinuation of Etoh  -Trial of Align x 30 days. If helpful she will continue.  -I will see her back in 4-5 weeks.  -If N/V persist, consider repeat EGD given antral ulcerations in 2018.   2. Steatosis, ? Early cirrhosis by CT AP in setting of obesity and chronic Etoh abuse. Need to rule out other etiologies of chronic liver disease. No evidence for portal HTN.  --Obtain labs for viral hepatitis, autoimmune / genetic / metabolic markers of chronic liver disease --Long discussion with her about need for Etoh cessation  --Weight loss is paramount. Offered consultation with a Dietician, she declines for now but will think about it.   3. GERD. Asymptomatic on OTC Omeprazole.  --Continue PPI --Weight loss may help  4. Chronic iron deficiency anemia. Negative EGD / colon with TI intubation May 2018. Awaiting EGD report but I didn't see that a small bowel biopsy for  Celiac was done nor in Care Everywhere did I find labs for celiac. IDA likely from dysfunctional uterine bleeding. She describes heavy, prolonged periods of bleeding.  -Await EGD report.  -can get labs for celiac at her follow up visit  5. Bipolar disorder,PTSD, anxiety, multiple personalities. On several psychiatric meds.   HPI:    Chief Complaint:   Abdominal pain, loose stool, nausea / vomiting, GERD  Evelyn Lopez is a 38 yo female with a PMH of GERD, chronic anemia, obesity, possibly asthma, hepatic steatosis, Etoh abuse, bipolar disorder, multiple personalities, PTSD, and anxiety. She is referred by GYN Eula Flax, MD for hepatic steatosis. Evelyn Lopez has multiple GI complaints today as listed above. She was evaluated by [facility] in May 2018 for many of these same issues as well as anemia. Summary of EGD / Colonoscopy findings found in Care Everywhere , see below. Basically she had antral gastritis with ulcerations and mild reflux esophagitis. Pandiverticulosis on colonoscopy. No source of anemia found. Most of her GI symptoms resolved or at least became tolerable until about a year ago.   Evelyn Lopez generally has a solid BM in the am followed by 5-6 loose stools throughout the day, occasionally containing a small amount of blood. She cannot correlated loose stools to any medications as regimen has been the same for years. She has noted that liquor causes more loose stool. She has frequent postprandial nausea and vomiting. She takes OTC Omeprazole almost everyday, otherwise she would have almost constant heartburn / regurgitation. She  frequently experiences RUQ pain unrelated to eating. Seen in ED on 8/2 for RLQ pain, told related to an ovarian cyst.   Evelyn Lopez had a CT scan in ED suggesting hepatic steatosis and ? Early cirrhosis. Transaminases mildly elevated. She has a longstanding history of heavy Etoh use, especially in her 8920's. Doesn't drink on a daily basis now. Averages maybe 6 beers and 10 shots of  liquor a week. Smokes Marijuana sometimes. No hx of IV drug use.   Evelyn Lopez reports a 70 pound weight gain this year after passing of her father.    Data Reviewed:  ED 01/15/19: CT AP w/ contrast IMPRESSION: 1. No acute findings within the abdomen or pelvis 2. Diffuse hepatic steatosis with relative enlargement of the caudate lobe and lateral segment of left lobe of liver. Findings may be seen with early cirrhosis. 3. Right ovary cyst measures 3 cm. This is almost certainly benign, and no specific imaging follow up is recommended according to the Society of Radiologists in Ultrasound2010 Consensus Conference Statement (D Lenis NoonLevine et al. Management of Asymptomatic Ovarian and Other Adnexal Cysts Imaged at US: Society of Radiologists in Ultrasound Consensus Conference Statement 2010. Radiology 256 (Sept 2010): 943-954.).   EGD and colonoscopy ([facility] May 2018) IMPRESSIONS and PLAN:  Mild reflux esophagitis  Antral gastritis with ulcerations  Normal duodenum  Pancolonic diverticulosis  Small descending colon ulcer  Internal and external hemorrhoids  Normal TI  No etiology of anemia found on EGD  Continue PPI and iron supplementation  Topical meds for hemorrhoids  Gastric bx - unremarkable. No H.pylori Descending colon bx - benign mucosa with focal mucosal hemorrhage. No inflammation  Past Medical History:  Diagnosis Date  . Bipolar disorder (HCC)   . Chronic back pain   . Ectopic pregnancy    June 2013  . Multiple personalities (HCC)   . Ovarian cyst   . Panic attacks   . PTSD (post-traumatic stress disorder)   . Seizure disorder [facility](HCC)      Past Surgical History:  Procedure Laterality Date  . LAPAROSCOPY  12/29/2011   Procedure: LAPAROSCOPY OPERATIVE;  Surgeon: Tresa EndoKelly A. Ernestina PennaFogleman, MD;  Location: [facility];  Service: Gynecology;  Laterality: Bilateral;  Right Salpingectomy   Family History  Problem Relation Age of Onset  . High blood pressure Mother   . Diabetes  Father   . Heart disease Father   . High blood pressure Father    Social History   Tobacco Use  . Smoking status: Current Every Day Smoker    Packs/day: 0.50    Years: 23.00    Pack years: 11.50    Types: Cigarettes  . Smokeless tobacco: Never Used  Substance Use Topics  . Alcohol use: Yes    Alcohol/week: 18.0 standard drinks    Types: 10 Cans of beer, 8 Shots of liquor per week    Comment: ocassionally   . Drug use: Not Currently   Current Outpatient Medications  Medication Sig Dispense Refill  . albuterol (PROAIR HFA) 108 (90 Base) MCG/ACT inhaler Inhale 2 puffs into the lungs every 6 (six) hours as needed for wheezing or shortness of breath. 1 Inhaler 5  . ALPRAZolam (XANAX) 1 MG tablet Take 1 mg by mouth 4 (four) times daily.    . budesonide-formoterol (SYMBICORT) 160-4.5 MCG/ACT inhaler Inhale 2 puffs into the lungs 2 (two) times daily. (Patient not taking: Reported on 01/15/2019) 2 Inhaler 0  . ferrous sulfate 325 (65 FE) MG tablet Take 1 tablet (325 mg total)  by mouth daily. (Patient not taking: Reported on 01/15/2019) 30 tablet 0  . HYDROcodone-acetaminophen (NORCO/VICODIN) 5-325 MG tablet Take 1 tablet by mouth every 6 (six) hours as needed for moderate pain. 10 tablet 0  . hydrOXYzine (ATARAX/VISTARIL) 25 MG tablet Take 1 tablet (25 mg total) by mouth every 6 (six) hours as needed for anxiety. (Patient taking differently: Take 50 mg by mouth at bedtime. ) 90 tablet 0  . lamoTRIgine (LAMICTAL) 25 MG tablet Take 2 tablets (50 mg total) by mouth 2 (two) times daily. For mood control 120 tablet 0  . naproxen (NAPROSYN) 500 MG tablet Take 1 tablet (500 mg total) by mouth 2 (two) times daily. (Patient not taking: Reported on 01/15/2019) 30 tablet 0  . predniSONE (DELTASONE) 10 MG tablet 3 pills daily for 2 days, 2 pills daily for 2 days, 1 pill daily for 2 days (Patient not taking: Reported on 01/15/2019) 12 tablet 0  . risperiDONE (RISPERDAL M-TABS) 1 MG disintegrating tablet Take 1 mg  by mouth 2 (two) times daily.     No current facility-administered medications for this visit.    Allergies  Allergen Reactions  . Rocephin [Ceftriaxone Sodium In Dextrose] Anaphylaxis     Review of Systems: All systems reviewed and negative except where noted in HPI.    Physical Exam:    Wt Readings from Last 3 Encounters:  05/11/18 260 lb (117.9 kg)  03/23/18 293 lb (132.9 kg)  10/09/17 250 lb (113.4 kg)    BP 120/78   Pulse 100   Temp 97.9 F (36.6 C)   Ht 5\' 9"  (1.753 m)   Wt (!) 306 lb 12.8 oz (139.2 kg)   BMI 45.31 kg/m  Constitutional:  Pleasant obese female in no acute distress. Psychiatric: Normal mood and affect. Behavior is normal. EENT: Pupils normal.  Conjunctivae are normal. No scleral icterus. Neck supple.  Cardiovascular: Normal rate, regular rhythm. No edema Pulmonary/chest: Effort normal and breath sounds normal. No wheezing, rales or rhonchi. Abdominal: Soft, nondistended, nontender. Bowel sounds active throughout. There are no masses palpable. No hepatomegaly. Neurological: Alert and oriented to person place and time. Skin: Skin is warm and dry. No rashes noted.  Willette Cluster, NP  02/15/2019, 8:48 AM  Cc: Ellison Hughs, MD

## 2019-02-19 ENCOUNTER — Telehealth: Payer: Self-pay | Admitting: Gastroenterology

## 2019-02-19 NOTE — Telephone Encounter (Signed)
Patient's mother called on her behalf on Sunday evening. Reports relatively acute onset of shortness of breath, pain from her chest to her umbilicus, nausea with some vomiting, dry mouth, light-headedness and dizziness. Symptoms are different than those that were reported to Evelyn Lopez during the patient's consultation appointment 02/15/19.    The patient's mother was concerned that she had infectious hepatitis because of the tests ordered at the time of her appointment. Was anxious because she had not yet received any of the lab results. Was also concerned that this was because the patient stopped drinking all alcohol and has made dramatic dietary changes to be healthy.   I then spoke with the patient who confirmed these symptoms.  I reviewed the available lab results with the patient and her mother. Given her ongoing symptoms, I suggested that she seek urgent care or ER evaluation for possible withdrawal, pancreatitis, viral infection or even alcohol-related hepatitis.  Her mother will take her to the ED for further evaluation tonight.

## 2019-02-21 ENCOUNTER — Telehealth: Payer: Self-pay | Admitting: Nurse Practitioner

## 2019-02-21 LAB — ANA: Anti Nuclear Antibody (ANA): NEGATIVE

## 2019-02-21 LAB — TISSUE TRANSGLUTAMINASE ABS,IGG,IGA
(tTG) Ab, IgA: 1 U/mL
(tTG) Ab, IgG: 1 U/mL

## 2019-02-21 LAB — HEPATITIS B SURFACE ANTIGEN: Hepatitis B Surface Ag: NONREACTIVE

## 2019-02-21 LAB — MITOCHONDRIAL ANTIBODIES: Mitochondrial M2 Ab, IgG: <=20 U

## 2019-02-21 LAB — TEST AUTHORIZATION

## 2019-02-21 LAB — ANTI-SMOOTH MUSCLE ANTIBODY, IGG: Actin (Smooth Muscle) Antibody (IGG): <20 U (ref <20)

## 2019-02-21 LAB — CERULOPLASMIN: Ceruloplasmin: 38 mg/dL (ref 18–53)

## 2019-02-21 LAB — HEPATITIS C ANTIBODY
Hepatitis C Ab: NONREACTIVE
SIGNAL TO CUT-OFF: 0.01 (ref <1.00)

## 2019-02-21 LAB — ALPHA-1-ANTITRYPSIN: A-1 Antitrypsin, Ser: 154 mg/dL (ref 83–199)

## 2019-02-21 LAB — IGA: Immunoglobulin A: 249 mg/dL (ref 47–310)

## 2019-02-21 LAB — HEPATITIS B SURFACE ANTIBODY,QUALITATIVE: Hep B S Ab: NONREACTIVE

## 2019-02-21 NOTE — Telephone Encounter (Signed)
Pt reported that she has been following the diet advised by Nevin Bloodgood but has been vomiting.  Pt would like to discuss.

## 2019-02-22 NOTE — Telephone Encounter (Signed)
Patient has vomited for the past 5 days. Her mother called the on call doctor over the weekend. Was advised to go to the ER. She did not go. Reports nausea and the sudden vomiting of stomach contents. She had cut out her carbs and gone on a low fat diet. She was eating greens and poultry. When the vomiting started she would eat only apple sauce, soda slushy or water. She tried scrambled eggs and toast but vomited that also.She feels hungry, eats, becomes nauseated and vomits. She denies fever.  She has to "lean a certain way" to not feel she is short of breath.She has become constipated, passing hard stool.

## 2019-02-22 NOTE — Telephone Encounter (Signed)
Left a message to call back to discuss. Labs to review. She also needs her follow up appointment scheduled.

## 2019-03-02 ENCOUNTER — Ambulatory Visit (INDEPENDENT_AMBULATORY_CARE_PROVIDER_SITE_OTHER): Payer: Self-pay | Admitting: Nurse Practitioner

## 2019-03-02 ENCOUNTER — Encounter: Payer: Self-pay | Admitting: Nurse Practitioner

## 2019-03-02 ENCOUNTER — Other Ambulatory Visit: Payer: Self-pay

## 2019-03-02 ENCOUNTER — Other Ambulatory Visit (INDEPENDENT_AMBULATORY_CARE_PROVIDER_SITE_OTHER): Payer: Self-pay

## 2019-03-02 VITALS — BP 130/78 | HR 108 | Temp 98.4°F | Ht 68.5 in | Wt 307.0 lb

## 2019-03-02 DIAGNOSIS — R112 Nausea with vomiting, unspecified: Secondary | ICD-10-CM

## 2019-03-02 DIAGNOSIS — R197 Diarrhea, unspecified: Secondary | ICD-10-CM

## 2019-03-02 DIAGNOSIS — R1084 Generalized abdominal pain: Secondary | ICD-10-CM

## 2019-03-02 DIAGNOSIS — E669 Obesity, unspecified: Secondary | ICD-10-CM

## 2019-03-02 LAB — HEPATIC FUNCTION PANEL
ALT: 22 U/L (ref 0–35)
AST: 28 U/L (ref 0–37)
Albumin: 4 g/dL (ref 3.5–5.2)
Alkaline Phosphatase: 86 U/L (ref 39–117)
Bilirubin, Direct: 0 mg/dL (ref 0.0–0.3)
Total Bilirubin: 0.4 mg/dL (ref 0.2–1.2)
Total Protein: 7.2 g/dL (ref 6.0–8.3)

## 2019-03-02 LAB — IGA: IgA: 240 mg/dL (ref 68–378)

## 2019-03-02 NOTE — Patient Instructions (Addendum)
If you are age 39 or older, your body mass index should be between 23-30. Your Body mass index is 46 kg/m. If this is out of the aforementioned range listed, please consider follow up with your Primary Care Provider.  If you are age 30 or younger, your body mass index should be between 19-25. Your Body mass index is 46 kg/m. If this is out of the aformentioned range listed, please consider follow up with your Primary Care Provider.   You have been scheduled for an endoscopy. Please follow written instructions given to you at your visit today. If you use inhalers (even only as needed), please bring them with you on the day of your procedure. Your physician has requested that you go to www.startemmi.com and enter the access code given to you at your visit today. This web site gives a general overview about your procedure. However, you should still follow specific instructions given to you by our office regarding your preparation for the procedure.  Your provider has requested that you go to the basement level for lab work before leaving today. Press "B" on the elevator. The lab is located at the first door on the left as you exit the elevator.  We are referring you to a Dietician for weight loss.  They will contact your regarding this appointment.  Start a low salt diet.  You have been given samples of FDgard.  Use as directed.  Imodium - Take 2 tablets after first loose bowel movement then one after each loose bowel movement . Do not exceed 8 tablets in 24 hours.  Follow up with Dr. Carlean Purl in two months.  The schedule is not available at this time. Please call the office in a few weeks to schedule a follow up appointment.  Due to recent COVID-19 restrictions implemented by our local and state authorities and in an effort to keep both patients and staff as safe as possible, our hospital system now requires COVID-19 testing prior to any scheduled hospital procedure. Please go to our [facility] location drive thru testing site ([address] [place], [place] [address]) on 03/03/19 at  1:35 pm. There will be multiple testing areas, the first checkpoint being for pre-procedure/surgery testing. Get into the right (yellow) lane that leads to the PAT testing team. You will not be billed at the time of testing but may receive a bill later depending on your insurance. The approximate cost of the test is $100. You must agree to quarantine from the time of your testing until the procedure date on 03/07/19 . This should include staying at home with ONLY the people you live with. Avoid take-out, grocery store shopping or leaving the house for any non-emergent reason. Failure to have your COVID-19 test done on the date and time you have been scheduled will result in cancellation of procedure. Please call our office at [phone] if you have any questions.    Thank you for choosing me and [facility].   Tye Savoy, NP   Low-Sodium Eating Plan Sodium, which is an element that makes up salt, helps you maintain a healthy balance of fluids in your body. Too much sodium can increase your blood pressure and cause fluid and waste to be held in your body. Your health care provider or dietitian may recommend following this plan if you have high blood pressure (hypertension), kidney disease, liver disease, or heart failure. Eating less sodium can help lower your blood pressure, reduce swelling, and protect  your heart, liver, and kidneys. What are tips for following this plan? General guidelines  Most people on this plan should limit their sodium intake to 1,500-2,000 mg (milligrams) of sodium each day. Reading food labels   The Nutrition Facts label lists the amount of sodium in one serving of the food. If you eat more than one serving, you must multiply the listed amount of sodium by the number of servings.  Choose foods with less than 140 mg of sodium per serving.  Avoid foods  with 300 mg of sodium or more per serving. Shopping  Look for lower-sodium products, often labeled as "low-sodium" or "no salt added."  Always check the sodium content even if foods are labeled as "unsalted" or "no salt added".  Buy fresh foods. ? Avoid canned foods and premade or frozen meals. ? Avoid canned, cured, or processed meats  Buy breads that have less than 80 mg of sodium per slice. Cooking  Eat more home-cooked food and less restaurant, buffet, and fast food.  Avoid adding salt when cooking. Use salt-free seasonings or herbs instead of table salt or sea salt. Check with your health care provider or pharmacist before using salt substitutes.  Cook with plant-based oils, such as canola, sunflower, or olive oil. Meal planning  When eating at a restaurant, ask that your food be prepared with less salt or no salt, if possible.  Avoid foods that contain MSG (monosodium glutamate). MSG is sometimes added to [place] food, bouillon, and some canned foods. What foods are recommended? The items listed may not be a complete list. Talk with your dietitian about what dietary choices are best for you. Grains Low-sodium cereals, including oats, puffed wheat and rice, and shredded wheat. Low-sodium crackers. Unsalted rice. Unsalted pasta. Low-sodium bread. Whole-grain breads and whole-grain pasta. Vegetables Fresh or frozen vegetables. "No salt added" canned vegetables. "No salt added" tomato sauce and paste. Low-sodium or reduced-sodium tomato and vegetable juice. Fruits Fresh, frozen, or canned fruit. Fruit juice. Meats and other protein foods Fresh or frozen (no salt added) meat, poultry, seafood, and fish. Low-sodium canned tuna and salmon. Unsalted nuts. Dried peas, beans, and lentils without added salt. Unsalted canned beans. Eggs. Unsalted nut butters. Dairy Milk. Soy milk. Cheese that is naturally low in sodium, such as ricotta cheese, fresh mozzarella, or Swiss cheese Low-sodium  or reduced-sodium cheese. Cream cheese. Yogurt. Fats and oils Unsalted butter. Unsalted margarine with no trans fat. Vegetable oils such as canola or olive oils. Seasonings and other foods Fresh and dried herbs and spices. Salt-free seasonings. Low-sodium mustard and ketchup. Sodium-free salad dressing. Sodium-free light mayonnaise. Fresh or refrigerated horseradish. Lemon juice. Vinegar. Homemade, reduced-sodium, or low-sodium soups. Unsalted popcorn and pretzels. Low-salt or salt-free chips. What foods are not recommended? The items listed may not be a complete list. Talk with your dietitian about what dietary choices are best for you. Grains Instant hot cereals. Bread stuffing, pancake, and biscuit mixes. Croutons. Seasoned rice or pasta mixes. Noodle soup cups. Boxed or frozen macaroni and cheese. Regular salted crackers. Self-rising flour. Vegetables Sauerkraut, pickled vegetables, and relishes. Olives. [place] fries. Onion rings. Regular canned vegetables (not low-sodium or reduced-sodium). Regular canned tomato sauce and paste (not low-sodium or reduced-sodium). Regular tomato and vegetable juice (not low-sodium or reduced-sodium). Frozen vegetables in sauces. Meats and other protein foods Meat or fish that is salted, canned, smoked, spiced, or pickled. Bacon, ham, sausage, hotdogs, corned beef, chipped beef, packaged lunch meats, salt pork, jerky, pickled herring, anchovies, regular canned  tuna, sardines, salted nuts. Dairy Processed cheese and cheese spreads. Cheese curds. Blue cheese. Feta cheese. String cheese. Regular cottage cheese. Buttermilk. Canned milk. Fats and oils Salted butter. Regular margarine. Ghee. Bacon fat. Seasonings and other foods Onion salt, garlic salt, seasoned salt, table salt, and sea salt. Canned and packaged gravies. Worcestershire sauce. Tartar sauce. Barbecue sauce. Teriyaki sauce. Soy sauce, including reduced-sodium. Steak sauce. Fish sauce. Oyster sauce.  Cocktail sauce. Horseradish that you find on the shelf. Regular ketchup and mustard. Meat flavorings and tenderizers. Bouillon cubes. Hot sauce and Tabasco sauce. Premade or packaged marinades. Premade or packaged taco seasonings. Relishes. Regular salad dressings. Salsa. Potato and tortilla chips. Corn chips and puffs. Salted popcorn and pretzels. Canned or dried soups. Pizza. Frozen entrees and pot pies. Summary  Eating less sodium can help lower your blood pressure, reduce swelling, and protect your heart, liver, and kidneys.  Most people on this plan should limit their sodium intake to 1,500-2,000 mg (milligrams) of sodium each day.  Canned, boxed, and frozen foods are high in sodium. Restaurant foods, fast foods, and pizza are also very high in sodium. You also get sodium by adding salt to food.  Try to cook at home, eat more fresh fruits and vegetables, and eat less fast food, canned, processed, or prepared foods. This information is not intended to replace advice given to you by your health care provider. Make sure you discuss any questions you have with your health care provider. Document Released: 11/21/2001 Document Revised: 05/14/2017 Document Reviewed: 05/25/2016 Elsevier Patient Education  2020 [employer]

## 2019-03-02 NOTE — Progress Notes (Addendum)
Chief Complaint:    Nausea, vomiting, abdominal pain, diarrhea  IMPRESSION and PLAN:     491.  38 year old female with bipolar disorder, anxiety, PTSD, obesity, and alcoholism here with persistent nausea, vomiting, generalized upper abdominal pain.  She has stopped drinking alcohol, stopped NSAIDs and is taking a PPI without improvement in symptoms. She has a history of gastritis / gastric ulcerations on EGD in 2018 by [facility]. Symptoms could be functional vrs PUD.  --For further evaluation patient will be scheduled for an EGD with Dr. Leone PayorGessner.  The risks and benefits of EGD were discussed and the patient agrees to proceed.  --For now she should continue to avoid NSAIDs.  Continue daily PPI. Declined anti-emetic --Trial of FDguard, samples given  2. Diarrhea. She was constipated up until 2018. Now has 4-5 loose, non -bloody BMs / day. No associated weight loss. Given chronicity infectious disease unlikely. Doesn't seem to be related to home meds. Doubt malabsorption. Doubt IBD. Suspect functional. --IgA, tTg, normal.   --trial of imodium 4 mg after first loose stool of day followed by 2 mg after each subsequent loose stool.  --Dairy makes diarrhea worse so should avoid  03/09/19 ADDENDUM-received records from digestive health in [place].  EGD 10/22/2016 for epigastric pain-grade a esophagitis, a sliding small hiatal hernia, erythema and ulceration in the antrum, normal duodenum.  Gastric biopsies unremarkable, no H. Pylori  Colonoscopy 10/22/2016 for evaluation of hematochezia.  The exam was complete bowel prep was good.  There were multiple diverticula in the whole colon, a small ulcer found in the descending colon, multiple biopsies taken from the descending colon.  Small internal and external hemorrhoids found.  Descending colon biopsies compatible with benign colonic mucosa.  3. Hepatic steatosis, ? Early cirrhosis.  Fortunately she has stopped drinking Etoh. Autoimmune / genetic /  viral markers of chronic liver disease were negative. If she has cirrhosis she probably doesn't have portal hypertension, further evaluation at time of EGD  -Referral to Dietician for weight loss, she is agreeable now.   4. IDA, chronic and most likely secondary to abnormal uterine bleeding. Colonoscopy with TI intubation ( [facility] in 2018) with findings of pancolonic diverticulosis, internal and external hemorrhoids, normal TI. She has follow up with GYN today.    HPI:     Patient is a 38 yo female with bipolar disorder, PTSD, anxiety, chronic iron deficiency anemia, GERD, history of alcohol abuse .  I saw her as a new patient to the practice on 02/15/19 for evaluation of an abnormal CT scan suggesting steatosis and possibly early cirrhosis.  At the time of our visit she had multiple GI complaints including loose stool, nausea and vomiting.  Plan was to discontinue Ibuprofen, quit alcohol and see what gastrointestinal symptoms remained afterwards.   I started her on a PPI and Align probiotics. We discussed role of weight loss in management of steatosis but she declined referral to Dietician.  Ordered labs for markers of chronic liver disease which are returned normal . Plan was to see her back in a few weeks. Patient worked in today for ongoing GI issues and also because she has a lot of questions about liver disease.   [name] hasn't had any Etoh since our last visit. She stopped NSAIDS and has been compliant with PPI without any real improvement in symptoms. She has had constant nausea and vomiting over the last several weeks. She has constant generalized abdominal pain involving mid and upper abdomen as  well as RUQ radiating around to her back.  Eating, bowel movements or activity has any effect on her abdominal pain.   Kawailani struggled with constipation a couple years ago but since then has had loose stools on a regular basis. She cannot correlate this bowel change to any medication changes but has  noticed it is worse with dairy.  She has been on the same psychiatric medications for 5 years now. She is still taking Align as recommended at our recent visit.   She has tried Imodium before but does not recall how much she took or what effect it had.   Review of systems:     No chest pain, no SOB, no fevers, no urinary sx   Past Medical History:  Diagnosis Date  . Bipolar disorder (HCC)   . Chronic back pain   . Cirrhosis (HCC)   . Ectopic pregnancy    June 2013  . Multiple personalities (HCC)   . Ovarian cyst   . Panic attacks   . PTSD (post-traumatic stress disorder)   . Seizure disorder [facility])     Patient's surgical history, family medical history, social history, medications and allergies were all reviewed in Epic    Current Outpatient Medications  Medication Sig Dispense Refill  . ALPRAZolam (XANAX) 1 MG tablet Take 1 mg by mouth 4 (four) times daily.    . budesonide-formoterol (SYMBICORT) 160-4.5 MCG/ACT inhaler Inhale 2 puffs into the lungs 2 (two) times daily. (Patient taking differently: Inhale 2 puffs into the lungs 2 (two) times daily as needed (respiratory issues.). ) 2 Inhaler 0  . rOPINIRole (REQUIP) 1 MG tablet Take 1 mg by mouth at bedtime.    . busPIRone (BUSPAR) 10 MG tablet Take 10 mg by mouth 2 (two) times daily. Morning & afternoon    . FDGARD 25-20.75 MG CAPS Take 2 capsules by mouth 2 (two) times daily. 30 minutes before lunch & supper    . hydrOXYzine (VISTARIL) 50 MG capsule Take 50 mg by mouth 2 (two) times daily. In the afternoon & at bedtime.    . lamoTRIgine (LAMICTAL) 200 MG tablet Take 200 mg by mouth daily.    Marland Kitchen omeprazole (PRILOSEC) 20 MG capsule Take 20 mg by mouth 2 (two) times daily.    Marland Kitchen oxymetazoline (AFRIN) 0.05 % nasal spray Place 1 spray into both nostrils 2 (two) times daily as needed for congestion. [facility] Severe Congestion Nasal    . Probiotic Product (ALIGN) 4 MG CAPS Take 4 mg by mouth daily.    . risperiDONE (RISPERDAL) 1 MG tablet  Take 1 mg by mouth daily.      No current facility-administered medications for this visit.     Physical Exam:     BP 130/78 (BP Location: Left Arm, Patient Position: Sitting, Cuff Size: Large)   Pulse (!) 108   Temp 98.4 F (36.9 C)   Ht 5' 8.5" (1.74 m) Comment: height measured without shoes  Wt (!) 307 lb (139.3 kg)   LMP 02/06/2019   BMI 46.00 kg/m   GENERAL:  Pleasant female in NAD PSYCH: : Cooperative, normal affect EENT:  conjunctiva pink, mucous membranes moist, neck supple without masses CARDIAC:  RRR, no murmur heard, no peripheral edema PULM: Normal respiratory effort, lungs CTA bilaterally, no wheezing ABDOMEN:  Nondistended, soft, nontender. No obvious masses, no hepatomegaly,  normal bowel sounds SKIN:  turgor, no lesions seen Musculoskeletal:  Normal muscle tone, normal strength NEURO: Alert and oriented x 3, no focal neurologic  deficits   Tye Savoy , NP 03/02/2019, 12:43 PM

## 2019-03-02 NOTE — H&P (View-Only) (Signed)
Chief Complaint:    Nausea, vomiting, abdominal pain, diarrhea  IMPRESSION and PLAN:     65.  38 year old female with bipolar disorder, anxiety, PTSD, obesity, and alcoholism here with persistent nausea, vomiting, generalized upper abdominal pain.  She has stopped drinking alcohol, stopped NSAIDs and is taking a PPI without improvement in symptoms. She has a history of gastritis / gastric ulcerations on EGD in 2018 by [facility]. Symptoms could be functional vrs PUD.  --For further evaluation patient will be scheduled for an EGD with Dr. Carlean Purl.  The risks and benefits of EGD were discussed and the patient agrees to proceed.  --For now she should continue to avoid NSAIDs.  Continue daily PPI. Declined anti-emetic --Trial of FDguard, samples given  2. Diarrhea. She was constipated up until 2018. Now has 4-5 loose, non -bloody BMs / day. No associated weight loss. Given chronicity infectious disease unlikely. Doesn't seem to be related to home meds. Doubt malabsorption. Doubt IBD. Suspect functional. --IgA, tTg, normal.   --trial of imodium 4 mg after first loose stool of day followed by 2 mg after each subsequent loose stool.  --Dairy makes diarrhea worse so should avoid  3. Hepatic steatosis, ? Early cirrhosis.  Fortunately she has stopped drinking Etoh. Autoimmune / genetic / viral markers of chronic liver disease were negative. If she has cirrhosis she probably doesn't have portal hypertension, further evaluation at time of EGD  -Referral to Dietician for weight loss, she is agreeable now.   4. IDA, chronic and most likely secondary to abnormal uterine bleeding. Colonoscopy with TI intubation ( [facility] in 2018) with findings of pancolonic diverticulosis, internal and external hemorrhoids, normal TI. She has follow up with GYN today.    HPI:     Patient is a 38 yo female with bipolar disorder, PTSD, anxiety, chronic iron deficiency anemia, GERD, history of alcohol abuse .  I saw her as  a new patient to the practice on 02/15/19 for evaluation of an abnormal CT scan suggesting steatosis and possibly early cirrhosis.  At the time of our visit she had multiple GI complaints including loose stool, nausea and vomiting.  Plan was to discontinue Ibuprofen, quit alcohol and see what gastrointestinal symptoms remained afterwards.   I started her on a PPI and Align probiotics. We discussed role of weight loss in management of steatosis but she declined referral to Dietician.  Ordered labs for markers of chronic liver disease which are returned normal . Plan was to see her back in a few weeks. Patient worked in today for ongoing GI issues and also because she has a lot of questions about liver disease.   Luci hasn't had any Etoh since our last visit. She stopped NSAIDS and has been compliant with PPI without any real improvement in symptoms. She has had constant nausea and vomiting over the last several weeks. She has constant generalized abdominal pain involving mid and upper abdomen as well as RUQ radiating around to her back.  Eating, bowel movements or activity has any effect on her abdominal pain.   Tamaya struggled with constipation a couple years ago but since then has had loose stools on a regular basis. She cannot correlate this bowel change to any medication changes but has noticed it is worse with dairy.  She has been on the same psychiatric medications for 5 years now. She is still taking Align as recommended at our recent visit.   She has tried Imodium before but does  not recall how much she took or what effect it had.   Review of systems:     No chest pain, no SOB, no fevers, no urinary sx   Past Medical History:  Diagnosis Date  . Bipolar disorder (HCC)   . Chronic back pain   . Cirrhosis (HCC)   . Ectopic pregnancy    June 2013  . Multiple personalities (HCC)   . Ovarian cyst   . Panic attacks   . PTSD (post-traumatic stress disorder)   . Seizure disorder [facility](HCC)      Patient's surgical history, family medical history, social history, medications and allergies were all reviewed in Epic    Current Outpatient Medications  Medication Sig Dispense Refill  . ALPRAZolam (XANAX) 1 MG tablet Take 1 mg by mouth 4 (four) times daily.    . budesonide-formoterol (SYMBICORT) 160-4.5 MCG/ACT inhaler Inhale 2 puffs into the lungs 2 (two) times daily. (Patient taking differently: Inhale 2 puffs into the lungs 2 (two) times daily as needed (respiratory issues.). ) 2 Inhaler 0  . rOPINIRole (REQUIP) 1 MG tablet Take 1 mg by mouth at bedtime.    . busPIRone (BUSPAR) 10 MG tablet Take 10 mg by mouth 2 (two) times daily. Morning & afternoon    . FDGARD 25-20.75 MG CAPS Take 2 capsules by mouth 2 (two) times daily. 30 minutes before lunch & supper    . hydrOXYzine (VISTARIL) 50 MG capsule Take 50 mg by mouth 2 (two) times daily. In the afternoon & at bedtime.    . lamoTRIgine (LAMICTAL) 200 MG tablet Take 200 mg by mouth daily.    Marland Kitchen. omeprazole (PRILOSEC) 20 MG capsule Take 20 mg by mouth 2 (two) times daily.    Marland Kitchen. oxymetazoline (AFRIN) 0.05 % nasal spray Place 1 spray into both nostrils 2 (two) times daily as needed for congestion. [facility] Severe Congestion Nasal    . Probiotic Product (ALIGN) 4 MG CAPS Take 4 mg by mouth daily.    . risperiDONE (RISPERDAL) 1 MG tablet Take 1 mg by mouth daily.      No current facility-administered medications for this visit.     Physical Exam:     BP 130/78 (BP Location: Left Arm, Patient Position: Sitting, Cuff Size: Large)   Pulse (!) 108   Temp 98.4 F (36.9 C)   Ht 5' 8.5" (1.74 m) Comment: height measured without shoes  Wt (!) 307 lb (139.3 kg)   LMP 02/06/2019   BMI 46.00 kg/m   GENERAL:  Pleasant female in NAD PSYCH: : Cooperative, normal affect EENT:  conjunctiva pink, mucous membranes moist, neck supple without masses CARDIAC:  RRR, no murmur heard, no peripheral edema PULM: Normal respiratory effort, lungs CTA  bilaterally, no wheezing ABDOMEN:  Nondistended, soft, nontender. No obvious masses, no hepatomegaly,  normal bowel sounds SKIN:  turgor, no lesions seen Musculoskeletal:  Normal muscle tone, normal strength NEURO: Alert and oriented x 3, no focal neurologic deficits   Willette ClusterPaula Daiya Tamer , NP 03/02/2019, 12:43 PM

## 2019-03-03 ENCOUNTER — Other Ambulatory Visit (HOSPITAL_COMMUNITY)
Admission: RE | Admit: 2019-03-03 | Discharge: 2019-03-03 | Disposition: A | Discharge status: Home / Self Care | Payer: HRSA Program | Source: Ambulatory Visit | Attending: Internal Medicine | Admitting: Internal Medicine

## 2019-03-03 ENCOUNTER — Encounter (HOSPITAL_COMMUNITY): Payer: Self-pay | Admitting: *Deleted

## 2019-03-03 ENCOUNTER — Other Ambulatory Visit: Payer: Self-pay

## 2019-03-03 DIAGNOSIS — Z01812 Encounter for preprocedural laboratory examination: Secondary | ICD-10-CM | POA: Diagnosis present

## 2019-03-03 DIAGNOSIS — Z20828 Contact with and (suspected) exposure to other viral communicable diseases: Secondary | ICD-10-CM | POA: Insufficient documentation

## 2019-03-03 LAB — TISSUE TRANSGLUTAMINASE, IGA: (tTG) Ab, IgA: 1 U/mL

## 2019-03-04 LAB — NOVEL CORONAVIRUS, NAA (HOSP ORDER, SEND-OUT TO REF LAB; TAT 18-24 HRS): SARS-CoV-2, NAA: NOT DETECTED

## 2019-03-06 NOTE — Anesthesia Preprocedure Evaluation (Addendum)
Anesthesia Evaluation  Patient identified by MRN, date of birth, ID band Patient awake    Reviewed: Allergy & Precautions, H&P , NPO status , Patient's Chart, lab work & pertinent test results, reviewed documented beta blocker date and time   History of Anesthesia Complications Negative for: history of anesthetic complications  Airway Mallampati: I  TM Distance: >3 FB Neck ROM: full    Dental no notable dental hx. (+) Teeth Intact, Dental Advisory Given   Pulmonary Current Smoker,  11.5 pack years Symbicort   breath sounds clear to auscultation       Cardiovascular negative cardio ROS   Rhythm:regular Rate:Normal     Neuro/Psych Seizures -,  PSYCHIATRIC DISORDERS Anxiety Bipolar Disorder Bipolar, panic attacks, multiple personalities, PTSD   GI/Hepatic PUD, GERD  Medicated,(+)     substance abuse  alcohol use, Hx PUD, EtOH abuse persistent nausea, vomiting, generalized upper abdominal pain   Endo/Other  negative endocrine ROSMorbid obesityBMI 46  Renal/GU negative Renal ROS  negative genitourinary   Musculoskeletal negative musculoskeletal ROS (+)   Abdominal (+) + obese,   Peds  Hematology negative hematology ROS (+)   Anesthesia Other Findings   Reproductive/Obstetrics negative OB ROS                            Anesthesia Physical  Anesthesia Plan  ASA: III  Anesthesia Plan: MAC   Post-op Pain Management:    Induction:   PONV Risk Score and Plan: 1 and Propofol infusion, TIVA and Treatment may vary due to age or medical condition  Airway Management Planned: Natural Airway and Nasal Cannula  Additional Equipment: None  Intra-op Plan:   Post-operative Plan:   Informed Consent: I have reviewed the patients History and Physical, chart, labs and discussed the procedure including the risks, benefits and alternatives for the proposed anesthesia with the patient or  authorized representative who has indicated his/her understanding and acceptance.     Dental Advisory Given  Plan Discussed with: CRNA  Anesthesia Plan Comments:         Anesthesia Quick Evaluation

## 2019-03-07 ENCOUNTER — Encounter (HOSPITAL_COMMUNITY): Payer: Self-pay | Admitting: Internal Medicine

## 2019-03-07 ENCOUNTER — Ambulatory Visit (HOSPITAL_COMMUNITY): Payer: Self-pay | Admitting: Anesthesiology

## 2019-03-07 ENCOUNTER — Other Ambulatory Visit: Payer: Self-pay

## 2019-03-07 ENCOUNTER — Ambulatory Visit (HOSPITAL_COMMUNITY)
Admission: RE | Admit: 2019-03-07 | Discharge: 2019-03-07 | Disposition: A | Discharge status: Home / Self Care | Payer: Self-pay | Attending: Internal Medicine | Admitting: Internal Medicine

## 2019-03-07 ENCOUNTER — Telehealth: Payer: Self-pay

## 2019-03-07 ENCOUNTER — Encounter (HOSPITAL_COMMUNITY)
Admission: RE | Disposition: A | Discharge status: Home / Self Care | Payer: Self-pay | Source: Home / Self Care | Attending: Internal Medicine

## 2019-03-07 DIAGNOSIS — K219 Gastro-esophageal reflux disease without esophagitis: Secondary | ICD-10-CM | POA: Insufficient documentation

## 2019-03-07 DIAGNOSIS — Z6841 Body Mass Index (BMI) 40.0 and over, adult: Secondary | ICD-10-CM | POA: Insufficient documentation

## 2019-03-07 DIAGNOSIS — G8929 Other chronic pain: Secondary | ICD-10-CM | POA: Insufficient documentation

## 2019-03-07 DIAGNOSIS — M549 Dorsalgia, unspecified: Secondary | ICD-10-CM | POA: Insufficient documentation

## 2019-03-07 DIAGNOSIS — K648 Other hemorrhoids: Secondary | ICD-10-CM | POA: Insufficient documentation

## 2019-03-07 DIAGNOSIS — R634 Abnormal weight loss: Secondary | ICD-10-CM | POA: Insufficient documentation

## 2019-03-07 DIAGNOSIS — K5730 Diverticulosis of large intestine without perforation or abscess without bleeding: Secondary | ICD-10-CM | POA: Insufficient documentation

## 2019-03-07 DIAGNOSIS — R112 Nausea with vomiting, unspecified: Secondary | ICD-10-CM

## 2019-03-07 DIAGNOSIS — F419 Anxiety disorder, unspecified: Secondary | ICD-10-CM | POA: Insufficient documentation

## 2019-03-07 DIAGNOSIS — F410 Panic disorder [episodic paroxysmal anxiety] without agoraphobia: Secondary | ICD-10-CM | POA: Insufficient documentation

## 2019-03-07 DIAGNOSIS — R1115 Cyclical vomiting syndrome unrelated to migraine: Secondary | ICD-10-CM | POA: Diagnosis present

## 2019-03-07 DIAGNOSIS — K760 Fatty (change of) liver, not elsewhere classified: Secondary | ICD-10-CM

## 2019-03-07 DIAGNOSIS — Z8711 Personal history of peptic ulcer disease: Secondary | ICD-10-CM | POA: Insufficient documentation

## 2019-03-07 DIAGNOSIS — Z7951 Long term (current) use of inhaled steroids: Secondary | ICD-10-CM | POA: Insufficient documentation

## 2019-03-07 DIAGNOSIS — E669 Obesity, unspecified: Secondary | ICD-10-CM | POA: Insufficient documentation

## 2019-03-07 DIAGNOSIS — F319 Bipolar disorder, unspecified: Secondary | ICD-10-CM | POA: Insufficient documentation

## 2019-03-07 DIAGNOSIS — F1020 Alcohol dependence, uncomplicated: Secondary | ICD-10-CM | POA: Insufficient documentation

## 2019-03-07 DIAGNOSIS — G40909 Epilepsy, unspecified, not intractable, without status epilepticus: Secondary | ICD-10-CM | POA: Insufficient documentation

## 2019-03-07 DIAGNOSIS — G43909 Migraine, unspecified, not intractable, without status migrainosus: Secondary | ICD-10-CM | POA: Insufficient documentation

## 2019-03-07 DIAGNOSIS — R1084 Generalized abdominal pain: Secondary | ICD-10-CM

## 2019-03-07 DIAGNOSIS — F4481 Dissociative identity disorder: Secondary | ICD-10-CM | POA: Insufficient documentation

## 2019-03-07 DIAGNOSIS — F17200 Nicotine dependence, unspecified, uncomplicated: Secondary | ICD-10-CM | POA: Insufficient documentation

## 2019-03-07 DIAGNOSIS — R197 Diarrhea, unspecified: Secondary | ICD-10-CM | POA: Insufficient documentation

## 2019-03-07 DIAGNOSIS — F4310 Post-traumatic stress disorder, unspecified: Secondary | ICD-10-CM | POA: Insufficient documentation

## 2019-03-07 DIAGNOSIS — Z79899 Other long term (current) drug therapy: Secondary | ICD-10-CM | POA: Insufficient documentation

## 2019-03-07 DIAGNOSIS — D509 Iron deficiency anemia, unspecified: Secondary | ICD-10-CM | POA: Insufficient documentation

## 2019-03-07 DIAGNOSIS — K644 Residual hemorrhoidal skin tags: Secondary | ICD-10-CM | POA: Insufficient documentation

## 2019-03-07 DIAGNOSIS — D500 Iron deficiency anemia secondary to blood loss (chronic): Secondary | ICD-10-CM | POA: Diagnosis present

## 2019-03-07 HISTORY — DX: Alcohol dependence, uncomplicated: F10.20

## 2019-03-07 HISTORY — DX: Iron deficiency anemia secondary to blood loss (chronic): D50.0

## 2019-03-07 HISTORY — PX: ESOPHAGOGASTRODUODENOSCOPY: SHX1529

## 2019-03-07 HISTORY — DX: Fatty (change of) liver, not elsewhere classified: K76.0

## 2019-03-07 HISTORY — PX: ESOPHAGOGASTRODUODENOSCOPY (EGD) WITH PROPOFOL: SHX5813

## 2019-03-07 HISTORY — DX: Cyclical vomiting syndrome unrelated to migraine: R11.15

## 2019-03-07 LAB — PREGNANCY, URINE: Preg Test, Ur: NEGATIVE

## 2019-03-07 SURGERY — ESOPHAGOGASTRODUODENOSCOPY (EGD) WITH PROPOFOL
Anesthesia: Monitor Anesthesia Care

## 2019-03-07 MED ORDER — PROMETHAZINE HCL 25 MG/ML IJ SOLN: 6.2500 mg | INTRAMUSCULAR | Status: DC | PRN

## 2019-03-07 MED ORDER — PROPOFOL 500 MG/50ML IV EMUL
INTRAVENOUS | Status: DC | PRN
  Administered 2019-03-07: 100 ug/kg/min via INTRAVENOUS

## 2019-03-07 MED ORDER — LACTATED RINGERS IV SOLN: INTRAVENOUS | Status: DC | PRN

## 2019-03-07 MED ORDER — PROPOFOL 10 MG/ML IV BOLUS
INTRAVENOUS | Status: AC
  Filled 2019-03-07: qty 40

## 2019-03-07 MED ORDER — SODIUM CHLORIDE 0.9 % IV SOLN: INTRAVENOUS | Status: DC

## 2019-03-07 MED ORDER — MEPERIDINE HCL 25 MG/ML IJ SOLN: 6.2500 mg | INTRAMUSCULAR | Status: DC | PRN

## 2019-03-07 MED ORDER — LACTATED RINGERS IV SOLN
INTRAVENOUS | Status: DC | PRN
  Administered 2019-03-07: 09:00:00 via INTRAVENOUS

## 2019-03-07 MED ORDER — PROPOFOL 500 MG/50ML IV EMUL
INTRAVENOUS | Status: DC | PRN
  Administered 2019-03-07: 10 mg via INTRAVENOUS
  Administered 2019-03-07: 40 mg via INTRAVENOUS

## 2019-03-07 MED ORDER — FENTANYL CITRATE (PF) 100 MCG/2ML IJ SOLN: 25.0000 ug | INTRAMUSCULAR | Status: DC | PRN

## 2019-03-07 SURGICAL SUPPLY — 14 items

## 2019-03-07 NOTE — Interval H&P Note (Signed)
History and Physical Interval Note:  03/07/2019 10:12 AM  Evelyn Lopez  has presented today for surgery, with the diagnosis of Nausea and vomiting, diarrhea, gen abdominal pain.  The various methods of treatment have been discussed with the patient and family. After consideration of risks, benefits and other options for treatment, the patient has consented to  Procedure(s): ESOPHAGOGASTRODUODENOSCOPY (EGD) WITH PROPOFOL (N/A) as a surgical intervention.  The patient's history has been reviewed, patient examined, no change in status, stable for surgery.  I have reviewed the patient's chart and labs.  Questions were answered to the patient's satisfaction.     Silvano Rusk

## 2019-03-07 NOTE — Telephone Encounter (Signed)
Tried to schedule 4 hour solid phase GES however, radiology scheduling advised that the pt is currently listed as admitted and the appt may be cancelled. I will call tomorrow and schedule.

## 2019-03-07 NOTE — Anesthesia Procedure Notes (Signed)
Date/Time: 03/07/2019 10:14 AM Performed by: Glory Buff, CRNA Oxygen Delivery Method: Simple face mask

## 2019-03-07 NOTE — Discharge Instructions (Signed)
° °  This test was normal which is good news but does not explain your problem.  Next step is to evaluate the stomach emptying with a gastric emptying test.   My office will set that up.  I appreciate the opportunity to care for you. Gatha Mayer, MD, FACG   YOU HAD AN ENDOSCOPIC PROCEDURE TODAY: Refer to the procedure report and other information in the discharge instructions given to you for any specific questions about what was found during the examination. If this information does not answer your questions, please call Dr. Celesta Aver office at [phone] to clarify.   YOU SHOULD EXPECT: Some feelings of bloating in the abdomen. Passage of more gas than usual. Walking can help get rid of the air that was put into your GI tract during the procedure and reduce the bloating. If you had a lower endoscopy (such as a colonoscopy or flexible sigmoidoscopy) you may notice spotting of blood in your stool or on the toilet paper. Some abdominal soreness may be present for a day or two, also.  DIET: Your first meal following the procedure should be a light meal and then it is ok to progress to your normal diet. A half-sandwich or bowl of soup is an example of a good first meal. Heavy or fried foods are harder to digest and may make you feel nauseous or bloated. Drink plenty of fluids but you should avoid alcoholic beverages for 24 hours.   ACTIVITY: Your care partner should take you home directly after the procedure. You should plan to take it easy, moving slowly for the rest of the day. You can resume normal activity the day after the procedure however YOU SHOULD NOT DRIVE, use power tools, machinery or perform tasks that involve climbing or major physical exertion for 24 hours (because of the sedation medicines used during the test).   SYMPTOMS TO REPORT IMMEDIATELY: A gastroenterologist can be reached at any hour. Please call [phone]  for any of the following symptoms:   Following upper  endoscopy (EGD, EUS, ERCP, esophageal dilation) Vomiting of blood or coffee ground material  New, significant abdominal pain  New, significant chest pain or pain under the shoulder blades  Painful or persistently difficult swallowing  New shortness of breath  Black, tarry-looking or red, bloody stools

## 2019-03-07 NOTE — Op Note (Signed)
[facility] Patient Name: Evelyn Lopez Procedure Date: 03/07/2019 MRN: [id number] Attending MD: Iva Boop , MD Date of Birth: 11/23/1980 CSN: [id number] Age: 38 Admit Type: Outpatient Procedure:                Upper GI endoscopy Indications:              Persistent vomiting Providers:                Iva Boop, MD, Dwain Sarna, RN, Beryle Beams, Technician, Lanna Poche, Technician,                            Roni Bread, CRNA Referring MD:              Medicines:                Propofol per Anesthesia, Monitored Anesthesia Care Complications:            No immediate complications. Estimated Blood Loss:     Estimated blood loss: none. Procedure:                Pre-Anesthesia Assessment:                           - Prior to the procedure, a History and Physical                            was performed, and patient medications and                            allergies were reviewed. The patient's tolerance of                            previous anesthesia was also reviewed. The risks                            and benefits of the procedure and the sedation                            options and risks were discussed with the patient.                            All questions were answered, and informed consent                            was obtained. Prior Anticoagulants: The patient has                            taken no previous anticoagulant or antiplatelet                            agents. ASA Grade Assessment: III - A patient with  severe systemic disease. After reviewing the risks                            and benefits, the patient was deemed in                            satisfactory condition to undergo the procedure.                           After obtaining informed consent, the endoscope was                            passed under direct vision. Throughout the   procedure, the patient's blood pressure, pulse, and                            oxygen saturations were monitored continuously. The                            GIF-H190 ([id number]) Olympus gastroscope was                            introduced through the mouth, and advanced to the                            second part of duodenum. The upper GI endoscopy was                            accomplished without difficulty. The patient                            tolerated the procedure well. Scope In: Scope Out: Findings:      The esophagus was normal.      The stomach was normal.      The examined duodenum was normal. Impression:               - Normal esophagus.                           - Normal stomach.                           - Normal examined duodenum.                           - No specimens collected. CAUSE OF SXS NOT FOUND -                            ? GASTRIC DYSFUNCTION, ? RELATIONSHIP TO OTHER                            ILLNESSES Moderate Sedation:      Not Applicable - Patient had care per Anesthesia. Recommendation:           - Patient has a contact number available for  emergencies. The signs and symptoms of potential                            delayed complications were discussed with the                            patient. Return to normal activities tomorrow.                            Written discharge instructions were provided to the                            patient.                           - Resume previous diet.                           - Continue present medications.                           - Do a gastric emptying study at appointment to be                            scheduled. MY OFFICE WILL CALL AND SCHEDULE THIS DX                            PERSISTENT VOMITING Procedure Code(s):        --- Professional ---                           [phone], Esophagogastroduodenoscopy, flexible,                            transoral; diagnostic,  including collection of                            specimen(s) by brushing or washing, when performed                            (separate procedure) Diagnosis Code(s):        --- Professional ---                           R11.15, Cyclical vomiting syndrome unrelated to                            migraine CPT copyright 2019 [employer]. All rights reserved. The codes documented in this report are preliminary and upon coder review may  be revised to meet current compliance requirements. Iva Boop, MD 03/07/2019 10:37:04 AM This report has been signed electronically. Number of Addenda: 0

## 2019-03-07 NOTE — Telephone Encounter (Signed)
-----   Message from Gatha Mayer, MD sent at 03/07/2019 10:42 AM EDT ----- Regarding: needs gastric emptying study This lady needs a 4 hour solid phase gastric emptying study  Dx persistent vomiting   Thanks  CEG

## 2019-03-07 NOTE — Transfer of Care (Signed)
Immediate Anesthesia Transfer of Care Note  Patient: Evelyn Lopez  Procedure(s) Performed: ESOPHAGOGASTRODUODENOSCOPY (EGD) WITH PROPOFOL (N/A )  Patient Location: PACU  Anesthesia Type:MAC  Level of Consciousness: awake, alert  and oriented  Airway & Oxygen Therapy: Patient Spontanous Breathing and Patient connected to face mask oxygen  Post-op Assessment: Report given to RN and Post -op Vital signs reviewed and stable  Post vital signs: Reviewed and stable  Last Vitals:  Vitals Value Taken Time  BP    Temp    Pulse 72 03/07/19 1033  Resp 16 03/07/19 1033  SpO2 100 % 03/07/19 1033  Vitals shown include unvalidated device data.  Last Pain:  Vitals:   03/07/19 0901  TempSrc: Oral         Complications: No apparent anesthesia complications

## 2019-03-07 NOTE — Anesthesia Postprocedure Evaluation (Signed)
Anesthesia Post Note  Patient: Evelyn Lopez  Procedure(s) Performed: ESOPHAGOGASTRODUODENOSCOPY (EGD) WITH PROPOFOL (N/A )     Patient location during evaluation: PACU Anesthesia Type: MAC Level of consciousness: awake and alert Pain management: pain level controlled Vital Signs Assessment: post-procedure vital signs reviewed and stable Respiratory status: spontaneous breathing, nonlabored ventilation, respiratory function stable and patient connected to nasal cannula oxygen Cardiovascular status: stable and blood pressure returned to baseline Postop Assessment: no apparent nausea or vomiting Anesthetic complications: no    Last Vitals:  Vitals:   03/07/19 1035 03/07/19 1040  BP: (!) 113/51 116/65  Pulse: 68 66  Resp: 16 14  Temp:    SpO2: 100% 99%    Last Pain:  Vitals:   03/07/19 1035  TempSrc:   PainSc: [place]

## 2019-03-07 NOTE — Telephone Encounter (Signed)
You have been scheduled for a gastric emptying scan at [facility] on______  At______ . Please arrive at least 15 minutes prior to your appointment for registration. Please make certain not to have anything to eat or drink after midnight the night before your test. Hold all stomach medications (ex: Zofran, phenergan, Reglan) 48 hours prior to your test. If you need to reschedule your appointment, please contact radiology scheduling at [phone].  A gastric-emptying study measures how long it takes for food to move through your stomach. There are several ways to measure stomach emptying. In the most common test, you eat food that contains a small amount of radioactive material. A scanner that detects the movement of the radioactive material is placed over your abdomen to monitor the rate at which food leaves your stomach. This test normally takes about 4 hours to complete.

## 2019-03-08 NOTE — Telephone Encounter (Signed)
The appt has been made for 03/12/19 at 730 am [facility] She will be at the appt for 4 hours and should have nothing to eat or drink after midnight

## 2019-03-08 NOTE — Telephone Encounter (Signed)
The patient has been notified of this information and all questions answered.  All questions answered and pt aware this is a 4 hour test.

## 2019-03-09 ENCOUNTER — Encounter (HOSPITAL_COMMUNITY): Payer: Self-pay | Admitting: Internal Medicine

## 2019-03-10 NOTE — Addendum Note (Signed)
Addendum  created 03/10/19 0715 by Pervis Hocking, DO   [place] recorded in [place], [place] filed

## 2019-03-21 ENCOUNTER — Encounter (HOSPITAL_BASED_OUTPATIENT_CLINIC_OR_DEPARTMENT_OTHER): Payer: Self-pay | Admitting: Emergency Medicine

## 2019-03-21 ENCOUNTER — Ambulatory Visit: Payer: Self-pay | Admitting: *Deleted

## 2019-03-21 ENCOUNTER — Emergency Department (HOSPITAL_BASED_OUTPATIENT_CLINIC_OR_DEPARTMENT_OTHER)
Admission: EM | Admit: 2019-03-21 | Discharge: 2019-03-21 | Disposition: A | Discharge status: Home / Self Care | Payer: Self-pay | Attending: Emergency Medicine | Admitting: Emergency Medicine

## 2019-03-21 ENCOUNTER — Emergency Department (HOSPITAL_BASED_OUTPATIENT_CLINIC_OR_DEPARTMENT_OTHER): Payer: Self-pay

## 2019-03-21 ENCOUNTER — Other Ambulatory Visit: Payer: Self-pay

## 2019-03-21 DIAGNOSIS — R079 Chest pain, unspecified: Secondary | ICD-10-CM | POA: Insufficient documentation

## 2019-03-21 DIAGNOSIS — D509 Iron deficiency anemia, unspecified: Secondary | ICD-10-CM | POA: Insufficient documentation

## 2019-03-21 DIAGNOSIS — F17210 Nicotine dependence, cigarettes, uncomplicated: Secondary | ICD-10-CM | POA: Insufficient documentation

## 2019-03-21 LAB — HEPATIC FUNCTION PANEL
ALT: 29 U/L (ref 0–44)
AST: 42 U/L — ABNORMAL HIGH (ref 15–41)
Albumin: 3.6 g/dL (ref 3.5–5.0)
Alkaline Phosphatase: 83 U/L (ref 38–126)
Bilirubin, Direct: <0.1 mg/dL (ref 0.0–0.2)
Total Bilirubin: 0.4 mg/dL (ref 0.3–1.2)
Total Protein: 7 g/dL (ref 6.5–8.1)

## 2019-03-21 LAB — BASIC METABOLIC PANEL
Anion gap: 6 (ref 5–15)
BUN: 11 mg/dL (ref 6–20)
CO2: 25 mmol/L (ref 22–32)
Calcium: 8.7 mg/dL — ABNORMAL LOW (ref 8.9–10.3)
Chloride: 106 mmol/L (ref 98–111)
Creatinine, Ser: 0.88 mg/dL (ref 0.44–1.00)
GFR calc Af Amer: >60 mL/min (ref >60)
GFR calc non Af Amer: >60 mL/min (ref >60)
Glucose, Bld: 115 mg/dL — ABNORMAL HIGH (ref 70–99)
Potassium: 3.5 mmol/L (ref 3.5–5.1)
Sodium: 137 mmol/L (ref 135–145)

## 2019-03-21 LAB — CBC
HCT: 28.5 % — ABNORMAL LOW (ref 36.0–46.0)
Hemoglobin: 7.7 g/dL — ABNORMAL LOW (ref 12.0–15.0)
MCH: 18.3 pg — ABNORMAL LOW (ref 26.0–34.0)
MCHC: 27 g/dL — ABNORMAL LOW (ref 30.0–36.0)
MCV: 67.7 fL — ABNORMAL LOW (ref 80.0–100.0)
Platelets: 389 10*3/uL (ref 150–400)
RBC: 4.21 MIL/uL (ref 3.87–5.11)
RDW: 18.7 % — ABNORMAL HIGH (ref 11.5–15.5)
WBC: 7.5 10*3/uL (ref 4.0–10.5)
nRBC: 0 % (ref 0.0–0.2)

## 2019-03-21 LAB — TROPONIN I (HIGH SENSITIVITY)
Troponin I (High Sensitivity): <2 ng/L (ref <18)
Troponin I (High Sensitivity): <2 ng/L (ref <18)

## 2019-03-21 LAB — D-DIMER, QUANTITATIVE: D-Dimer, Quant: 0.42 ug/mL-FEU (ref 0.00–0.50)

## 2019-03-21 LAB — T4, FREE: Free T4: 0.91 ng/dL (ref 0.61–1.12)

## 2019-03-21 LAB — AMMONIA: Ammonia: 12 umol/L (ref 9–35)

## 2019-03-21 LAB — OCCULT BLOOD X 1 CARD TO LAB, STOOL: Fecal Occult Bld: NEGATIVE

## 2019-03-21 LAB — TSH: TSH: 1.382 u[IU]/mL (ref 0.350–4.500)

## 2019-03-21 NOTE — Telephone Encounter (Signed)
Call to patient- patient is currently seeing GI for several issues- enlarged liver, stomach problems- but she has also been anemic for some time. She has seen GYN- but that is when they found the liver problem. Patient is complaining of extreme fatigue and she does take iron supplements. Patient advised to go to [facility] for evaluation of her symptoms due to protocol. Patient is aware that if she is too low she may need IV treatment rather than OTC supplements.  Reason for Disposition . [1] MODERATE weakness (i.e., interferes with work, school, normal activities) AND [2] cause unknown  (Exceptions: weakness with acute minor illness, or weakness from poor fluid intake)  Answer Assessment - Initial Assessment Questions 1. DESCRIPTION: "Describe how you are feeling."     Sleeps all the time, fatigued doing anything- exhausted 2. SEVERITY: "How bad is it?"  "Can you stand and walk?"   - MILD - Feels weak or tired, but does not interfere with work, school or normal activities   - [place] to stand and walk; weakness interferes with work, school, or normal activities   - SEVERE - Unable to stand or walk     moderate 3. ONSET:  "When did the weakness begin?"     Since saturday 4. CAUSE: "What do you think is causing the weakness?"     Possible low hemaglobin 5. MEDICINES: "Have you recently started a new medicine or had a change in the amount of a medicine?"     Patient has been taking iron suupliments 6. OTHER SYMPTOMS: "Do you have any other symptoms?" (e.g., chest pain, fever, cough, SOB, vomiting, diarrhea, bleeding, other areas of pain)     Vomiting when she eats, diarrhea for couple months- is seeing GI 7. PREGNANCY: "Is there any chance you are pregnant?" "When was your last menstrual period?"     No, LMP- 9/9-9/4  Protocols used: WEAKNESS (GENERALIZED) AND FATIGUE-A-AH

## 2019-03-21 NOTE — Discharge Instructions (Signed)
Follow-up with your OB/GYN and GI doctor.  Discuss having an outpatient iron infusion.

## 2019-03-21 NOTE — ED Triage Notes (Signed)
Hx of anemia related to periods.  Has been treated for this in the past few months.  Was tested for Covid 1 1/2 weeks ago, prior to Endoscopy, and it was negative. For past 3 days pt is fatigued, weak, dizzy, nauseated, chilling, and having clouded sensorium. Was sent to ED by triage nurse with [facility].

## 2019-03-21 NOTE — ED Notes (Signed)
ED Provider at bedside. 

## 2019-03-21 NOTE — ED Notes (Signed)
Pt stated she is starting to mid sternal chest pain that is described as dull. Pain is worse upon inspiration.

## 2019-03-21 NOTE — Telephone Encounter (Signed)
Pt has not established care with our office yet. No one to route message to at this time.

## 2019-03-21 NOTE — ED Provider Notes (Signed)
[facility] EMERGENCY DEPARTMENT Provider Note   CSN: [id number] Arrival date & time: 03/21/19  1615     History   Chief Complaint Chief Complaint  Patient presents with  . fatigue, weakness    HPI Evelyn Lopez is a 38 y.o. female.     HPI Pt has been feeling fatigued for several days  .  She feels cold, she does not have any energy.  She is falling asleep easily.  Her mouth feels dry.  She does have history of anemia and  liver disease related to alcohol use.    She previously had prolonged menses but that has resolved and she continues to be anemic despite iron supplementation. Pt has had GI workup including endoscopy. Pt no longer is drinking.  She is establishing care with a PCP, called to arrange close follow up today and was instructed to come to the ED.  Earlier today she developed pain in her chest and neck. This was a new issues .  The pain radiated to her back.  SHe is no longer having pain. Past Medical History:  Diagnosis Date  . Alcohol dependence (HCC)   . Bipolar disorder (HCC)   . Chronic back pain   . Ectopic pregnancy    June 2013  . Fatty liver- NAFLD vs EtOH or both 03/07/2019  . Iron deficiency anemia due to chronic blood loss 03/07/2019  . Multiple personalities (HCC)   . Ovarian cyst   . Panic attacks   . Persistent vomiting 03/07/2019  . PTSD (post-traumatic stress disorder)   . Seizure disorder [facility](HCC)     Patient Active Problem List   Diagnosis Date Noted  . Persistent vomiting 03/07/2019  . Iron deficiency anemia due to chronic blood loss - menstrual 03/07/2019  . Fatty liver- NAFLD vs EtOH or both 03/07/2019  . Panic disorder 08/02/2017  . Substance use disorder 08/02/2017  . Other bipolar disorder (HCC) 08/01/2017    Past Surgical History:  Procedure Laterality Date  . COLONOSCOPY    . ESOPHAGOGASTRODUODENOSCOPY    . ESOPHAGOGASTRODUODENOSCOPY (EGD) WITH PROPOFOL N/A 03/07/2019   Procedure: ESOPHAGOGASTRODUODENOSCOPY (EGD) WITH  PROPOFOL;  Surgeon: Iva BoopGessner, Carl E, MD;  Location: [facility] ENDOSCOPY;  Service: Endoscopy;  Laterality: N/A;  . LAPAROSCOPY  12/29/2011   Procedure: LAPAROSCOPY OPERATIVE;  Surgeon: Tresa EndoKelly A. Ernestina PennaFogleman, MD;  Location: [facility] ORS;  Service: Gynecology;  Laterality: Bilateral;  Right Salpingectomy  . SALPINGOOPHORECTOMY Right      OB History    Gravida  2   Para  0   Term  0   Preterm  0   AB  0   Living  0     SAB  0   TAB  0   Ectopic  0   Multiple  0   Live Births               Home Medications    Prior to Admission medications   Medication Sig Start Date End Date Taking? Authorizing Provider  ALPRAZolam Prudy Feeler(XANAX) 1 MG tablet Take 1 mg by mouth 4 (four) times daily.    [provider]  budesonide-formoterol (SYMBICORT) 160-4.5 MCG/ACT inhaler Inhale 2 puffs into the lungs 2 (two) times daily. Patient taking differently: Inhale 2 puffs into the lungs 2 (two) times daily as needed (respiratory issues.).  08/24/18   Coralyn HellingSood, Vineet, MD  busPIRone (BUSPAR) 10 MG tablet Take 10 mg by mouth 2 (two) times daily. Morning & afternoon 02/14/19   [provider]  FDGARD 25-20.75 MG CAPS Take 2 capsules by mouth 2 (two) times daily. 30 minutes before lunch & supper    [provider]  hydrOXYzine (VISTARIL) 50 MG capsule Take 50 mg by mouth 2 (two) times daily. In the afternoon & at bedtime. 02/20/19   [provider]  lamoTRIgine (LAMICTAL) 200 MG tablet Take 200 mg by mouth daily. 02/14/19   [provider]  omeprazole (PRILOSEC) 20 MG capsule Take 20 mg by mouth 2 (two) times daily.    [provider]  oxymetazoline (AFRIN) 0.05 % nasal spray Place 1 spray into both nostrils 2 (two) times daily as needed for congestion. [facility] Severe Congestion Nasal    [provider]  Probiotic Product (ALIGN) 4 MG CAPS Take 4 mg by mouth daily.    [provider]  risperiDONE (RISPERDAL) 1 MG tablet Take 1 mg by mouth daily.  02/14/19    [provider]  rOPINIRole (REQUIP) 1 MG tablet Take 1 mg by mouth at bedtime.    [provider]    Family History Family History  Problem Relation Age of Onset  . High blood pressure Mother   . Irritable bowel syndrome Mother   . Diabetes Father   . Heart disease Father   . High blood pressure Father   . Other Father        fatty liver  . Colon cancer Neg Hx   . Esophageal cancer Neg Hx     Social History Social History   Tobacco Use  . Smoking status: Current Every Day Smoker    Packs/day: 0.50    Years: 23.00    Pack years: 11.50    Types: Cigarettes  . Smokeless tobacco: Never Used  Substance Use Topics  . Alcohol use: Not Currently    Alcohol/week: 18.0 standard drinks    Types: 10 Cans of beer, 8 Shots of liquor per week    Comment: stopped 2 weeks ago  . Drug use: Not Currently     Allergies   Rocephin [ceftriaxone sodium in dextrose]   Review of Systems Review of Systems  Constitutional: Negative for fever.  Respiratory: Negative for shortness of breath.   Gastrointestinal: Positive for diarrhea and vomiting ( once this am).  Genitourinary: Negative for dysuria and vaginal bleeding.  All other systems reviewed and are negative.    Physical Exam Updated Vital Signs BP 117/74   Pulse 91   Temp 99.1 F (37.3 C) (Oral)   Resp 20   Ht 1.74 m (5' 8.5")   Wt (!) 139.1 kg   LMP 02/06/2019   SpO2 100%   BMI 45.94 kg/m   Physical Exam Vitals signs and nursing note reviewed.  Constitutional:      General: She is not in acute distress.    Appearance: She is well-developed.  HENT:     Head: Normocephalic and atraumatic.     Right Ear: External ear normal.     Left Ear: External ear normal.  Eyes:     General: No scleral icterus.       Right eye: No discharge.        Left eye: No discharge.     Conjunctiva/sclera: Conjunctivae normal.  Neck:     Musculoskeletal: Neck supple.     Trachea: No tracheal deviation.   Cardiovascular:     Rate and Rhythm: Regular rhythm. Tachycardia present.  Pulmonary:     Effort: Pulmonary effort is normal. No respiratory distress.  Breath sounds: Normal breath sounds. No stridor. No wheezing or rales.  Abdominal:     General: Bowel sounds are normal. There is no distension.     Palpations: Abdomen is soft.     Tenderness: There is no abdominal tenderness. There is no guarding or rebound.  Musculoskeletal:        General: No tenderness.  Skin:    General: Skin is warm and dry.     Findings: No rash.  Neurological:     Mental Status: She is alert.     Cranial Nerves: No cranial nerve deficit (no facial droop, extraocular movements intact, no slurred speech).     Sensory: No sensory deficit.     Motor: No abnormal muscle tone or seizure activity.     Coordination: Coordination normal.      ED Treatments / Results  Labs (all labs ordered are listed, but only abnormal results are displayed) Labs Reviewed  BASIC METABOLIC PANEL - Abnormal; Notable for the following components:      Result Value   Glucose, Bld 115 (*)    Calcium 8.7 (*)    All other components within normal limits  CBC - Abnormal; Notable for the following components:   Hemoglobin 7.7 (*)    HCT 28.5 (*)    MCV 67.7 (*)    MCH 18.3 (*)    MCHC 27.0 (*)    RDW 18.7 (*)    All other components within normal limits  HEPATIC FUNCTION PANEL - Abnormal; Notable for the following components:   AST 42 (*)    All other components within normal limits  TSH  T4, FREE  AMMONIA  D-DIMER, QUANTITATIVE (NOT AT [facility])  OCCULT BLOOD X 1 CARD TO LAB, STOOL  TROPONIN I (HIGH SENSITIVITY)  TROPONIN I (HIGH SENSITIVITY)    EKG EKG Interpretation  Date/Time:  Tuesday March 21 2019 17:50:32 EDT Ventricular Rate:  93 PR Interval:    QRS Duration: 96 QT Interval:  382 QTC Calculation: 476 R Axis:   50 Text Interpretation:  Sinus rhythm Consider left atrial enlargement Low voltage, precordial  leads Nonspecific T abnormalities, anterior leads No significant change since last tracing Confirmed by Linwood Dibbles [phone]) on 03/21/2019 6:00:10 PM   Radiology Dg Chest 2 View  Result Date: 03/21/2019 CLINICAL DATA:  Chest pain, chills, nausea, and dizziness for 3 days. EXAM: CHEST - 2 VIEW COMPARISON:  03/18/2017 FINDINGS: The heart size and mediastinal contours are within normal limits. Both lungs are clear. The visualized skeletal structures are unremarkable. IMPRESSION: Negative.  No active cardiopulmonary disease. Electronically Signed   By: Danae Orleans M.D.   On: 03/21/2019 19:17    Procedures Procedures (including critical care time)  Medications Ordered in ED Medications - No data to display   Initial Impression / Assessment and Plan / ED Course  I have reviewed the triage vital signs and the nursing notes.  Pertinent labs & imaging results that were available during my care of the patient were reviewed by me and considered in my medical decision making (see chart for details).  Clinical Course as of Mar 20 2306  Tue Mar 21, 2019  2020 Labs notable for worsening hemoglobin.  Down to 7.7.   [JK]  2020 Patient hemoglobin is 9.1,2 months ago   [JK]  2305 Hemoccult is negative.  Case was discussed with Dr. Marina Goodell, gastroenterology.  Agrees with outpatient management.   [JK]    Clinical Course User Index [JK] Linwood Dibbles, MD  Patient presented to the ED for evaluation of worsening fatigue.  Patient's had an outpatient GI work-up.  She previously had a colonoscopy in 2018 when she was having hematochezia.  Patient had recent endoscopy that has not shown any active bleeding.  Patient is on iron tablets but continues to have worsening anemia.  No evidence of active bleeding today.  She denies any menstrual bleeding.  Hemoccult test is negative.  I think the patient would benefit possibly from an iron infusion.  She is hemodynamically stable and is agreeable to outpatient follow-up.   I recommend she follow-up with her GYN doctor and GI doctor to see if they can arrange for an iron infusion.  Final Clinical Impressions(s) / ED Diagnoses   Final diagnoses:  Iron deficiency anemia, unspecified iron deficiency anemia type    ED Discharge Orders    None       Linwood Dibbles, MD 03/21/19 2307

## 2019-03-22 ENCOUNTER — Telehealth: Payer: Self-pay | Admitting: Internal Medicine

## 2019-03-22 ENCOUNTER — Ambulatory Visit (HOSPITAL_COMMUNITY): Admit: 2019-03-22 | Payer: Self-pay | Admitting: Internal Medicine

## 2019-03-22 ENCOUNTER — Encounter (HOSPITAL_COMMUNITY): Payer: Self-pay

## 2019-03-22 ENCOUNTER — Ambulatory Visit: Payer: Self-pay | Admitting: Nurse Practitioner

## 2019-03-22 DIAGNOSIS — R112 Nausea with vomiting, unspecified: Secondary | ICD-10-CM

## 2019-03-22 DIAGNOSIS — D649 Anemia, unspecified: Secondary | ICD-10-CM

## 2019-03-22 SURGERY — ESOPHAGOGASTRODUODENOSCOPY (EGD) WITH PROPOFOL
Anesthesia: Monitor Anesthesia Care

## 2019-03-22 NOTE — Telephone Encounter (Signed)
Copied from [place] [id number]. Topic: Quick Communication - See Telephone Encounter >> Mar 20, 2019 11:54 AM Loma [place] wrote: CRM for notification. See Telephone encounter for: 03/20/19. New pt papers   NPP sent 03/22/2019

## 2019-03-22 NOTE — Telephone Encounter (Signed)
Please see ED notes.  Do you want to order iron transfusion?

## 2019-03-23 NOTE — Telephone Encounter (Signed)
I spoke with her about recommendations and cost of infusions.  She is going to try and apply to the [facility].  She has applied for disability and Medicaid.  She wants to apply to the program for assistance before incurring the infusion costs.  We only have 8 days worth of samples of iron.  She is taking OTC oral iron daily.  Does she need to increase the dosage?  I emailed her the assistance program application.

## 2019-03-23 NOTE — Telephone Encounter (Signed)
She can take it bid with some OJ  CBC in 2 weeks   Is she still vomiting?  We can hold off on the gastric emptying study to save her $ also  Explain it may show poor emptying but if she is improved would hold off on that.  I can discuss with her also by next week

## 2019-03-23 NOTE — Telephone Encounter (Signed)
feraheme infusion is reasonable - am willing to rx that  Cost may be an issue for hetr as I think she is uninsured though she is doing other w/u also   If she wants to get it then order feraheme x 2 with f/u CBC and ferritin 1 month after second infusion   She has 11/27 gastric emptying study pending   If she does not have a f/u me please arrange one next available after that

## 2019-03-23 NOTE — Telephone Encounter (Signed)
Patient notified She will come for labs in 2 weeks She is still vomiting and will keep GES ordered on 04/11/19

## 2019-03-23 NOTE — Addendum Note (Signed)
Addended by: Marlon Pel on: 03/23/2019 03:39 PM   Modules accepted: Orders

## 2019-04-03 ENCOUNTER — Other Ambulatory Visit: Payer: Self-pay

## 2019-04-03 DIAGNOSIS — Z20822 Contact with and (suspected) exposure to covid-19: Secondary | ICD-10-CM

## 2019-04-04 LAB — NOVEL CORONAVIRUS, NAA: SARS-CoV-2, NAA: NOT DETECTED

## 2019-04-11 ENCOUNTER — Ambulatory Visit (HOSPITAL_COMMUNITY)
Admission: RE | Admit: 2019-04-11 | Discharge: 2019-04-11 | Disposition: A | Discharge status: Home / Self Care | Payer: Self-pay | Source: Ambulatory Visit | Attending: Internal Medicine | Admitting: Internal Medicine

## 2019-04-11 ENCOUNTER — Other Ambulatory Visit: Payer: Self-pay

## 2019-04-11 DIAGNOSIS — R112 Nausea with vomiting, unspecified: Secondary | ICD-10-CM | POA: Insufficient documentation

## 2019-04-11 MED ORDER — TECHNETIUM TC 99M SULFUR COLLOID
2.0000 | Freq: Once | INTRAVENOUS | Status: AC | PRN
  Administered 2019-04-11: 2 via INTRAVENOUS

## 2019-04-11 NOTE — Progress Notes (Signed)
[place],  The stomach empties normally so stomach dysfunction/delayed emptying is not a problem and not causing the vomiting.  You are due for a CBC - looks like you are meeting Dr. Martinique tomorrow so you/we can ask her to do that. I am copying her on this message.  You may need iron or other iron by vein if oral iron supplements are not helping.  You need help with the abnormal menstrual bleeding also - either from Dr. Martinique or by seeing gynecology  We will put you in for an appointment to see me next available also  [facility]

## 2019-04-12 ENCOUNTER — Encounter

## 2019-04-12 ENCOUNTER — Ambulatory Visit (INDEPENDENT_AMBULATORY_CARE_PROVIDER_SITE_OTHER): Payer: Self-pay | Admitting: Family Medicine

## 2019-04-12 ENCOUNTER — Encounter: Payer: Self-pay | Admitting: Family Medicine

## 2019-04-12 VITALS — BP 130/78 | HR 84 | Temp 96.2°F | Resp 16 | Ht 68.5 in | Wt 310.4 lb

## 2019-04-12 DIAGNOSIS — Z23 Encounter for immunization: Secondary | ICD-10-CM

## 2019-04-12 DIAGNOSIS — N938 Other specified abnormal uterine and vaginal bleeding: Secondary | ICD-10-CM | POA: Insufficient documentation

## 2019-04-12 DIAGNOSIS — K760 Fatty (change of) liver, not elsewhere classified: Secondary | ICD-10-CM

## 2019-04-12 DIAGNOSIS — Z13220 Encounter for screening for lipoid disorders: Secondary | ICD-10-CM

## 2019-04-12 DIAGNOSIS — Z131 Encounter for screening for diabetes mellitus: Secondary | ICD-10-CM

## 2019-04-12 DIAGNOSIS — D500 Iron deficiency anemia secondary to blood loss (chronic): Secondary | ICD-10-CM

## 2019-04-12 LAB — LIPID PANEL
Cholesterol: 152 mg/dL (ref 0–200)
HDL: 33.8 mg/dL — ABNORMAL LOW (ref >39.00)
LDL Cholesterol: 79 mg/dL (ref 0–99)
NonHDL: 118.21
Total CHOL/HDL Ratio: 4
Triglycerides: 198 mg/dL — ABNORMAL HIGH (ref 0.0–149.0)
VLDL: 39.6 mg/dL (ref 0.0–40.0)

## 2019-04-12 LAB — CBC
HCT: 26 % — ABNORMAL LOW (ref 36.0–46.0)
Hemoglobin: 7.9 g/dL — CL (ref 12.0–15.0)
MCHC: 30.3 g/dL (ref 30.0–36.0)
MCV: 60.8 fl — ABNORMAL LOW (ref 78.0–100.0)
Platelets: 360 10*3/uL (ref 150.0–400.0)
RBC: 4.28 Mil/uL (ref 3.87–5.11)
RDW: 19.9 % — ABNORMAL HIGH (ref 11.5–15.5)
WBC: 9 10*3/uL (ref 4.0–10.5)

## 2019-04-12 LAB — HEMOGLOBIN A1C: Hgb A1c MFr Bld: 5.9 % (ref 4.6–6.5)

## 2019-04-12 NOTE — Assessment & Plan Note (Signed)
Alcohol avoidance and weight loss strongly recommended. Continue following with gastroenterologist.

## 2019-04-12 NOTE — Progress Notes (Signed)
HPI:   Evelyn Lopez is a 38 y.o. female, who is here today to establish care.  Former PCP: N/A Last preventive routine visit: > a year ago.  Chronic medical problems: Anxiety, SOB, (follows with Dr. Harriette Bouillon liver, elevated transaminases, s/p Currently she is on alprazolam 1 mg every 6 hours as needed, she gets $920 per month, last prescription on file or 03/20/2019, 2 days earlier (02/20/2019, 01/23/2019, 12/23/2018, and 11/22/2018). She is also on BuSpar 10 mg twice daily.  Concerns today: Anemia, she would like CBC and other labs done. Currently she is on OTC iron supplementation, recently increased from once daily to twice daily. She denies nausea/gum bleeding, blood in the stool, melena, or gross hematuria. Negative for abnormal weight loss or night sweats.  Component     Latest Ref Rng & Units 03/21/2019          WBC     4.0 - 10.5 K/uL 7.5  RBC     3.87 - 5.11 Mil/uL 4.21  Hemoglobin     12.0 - 15.0 g/dL 7.7 (L)  HCT     36.0 - 46.0 % 28.5 (L)  MCV     78.0 - 100.0 fl 67.7 (L)  MCH     26.0 - 34.0 pg 18.3 (L)  MCHC     30.0 - 36.0 g/dL 27.0 (L)  RDW     11.5 - 15.5 % 18.7 (H)  Platelets     150.0 - 400.0 K/uL 389   History of heavy menses, with blood clots, intermittent vaginal bleeding. She follows with gynecologist. According to patient, the plan is to an IUD placed. Pica: Ice.  Lab Results  Component Value Date   CREATININE 0.88 03/21/2019   BUN 11 03/21/2019   NA 137 03/21/2019   K 3.5 03/21/2019   CL 106 03/21/2019   CO2 25 03/21/2019   Obesity: Frustrated because she has not been able to see a nutritionist, according to patient, she is on the waiting list. States that she has tried to diet, but due to chronic GI issues she has many food restrictions. She does not exercise regularly.  Bipolar disorder and depression, she follows with psychiatrist. Currently she is on Risperdal 1 mg daily and Lamictal 200 mg daily.  She reports history  of cirrhosis-like changes in her liver. History of alcohol abuse.  Lab Results  Component Value Date   ALT 29 03/21/2019   AST 42 (H) 03/21/2019   ALKPHOS 83 03/21/2019   BILITOT 0.4 03/21/2019    Review of Systems  Constitutional: Positive for fatigue. Negative for activity change, appetite change and fever.  HENT: Negative for mouth sores, nosebleeds and trouble swallowing.   Eyes: Negative for redness and visual disturbance.  Respiratory: Negative for cough, shortness of breath and wheezing.   Cardiovascular: Negative for chest pain, palpitations and leg swelling.  Gastrointestinal: Negative for abdominal pain, nausea and vomiting.       Negative for changes in bowel habits.  Endocrine: Negative for polydipsia, polyphagia and polyuria.  Genitourinary: Negative for decreased urine volume, difficulty urinating and hematuria.  Musculoskeletal: Positive for arthralgias. Negative for gait problem.  Allergic/Immunologic: Positive for environmental allergies.  Neurological: Negative for syncope, weakness and headaches.  Psychiatric/Behavioral: Negative for confusion. The patient is nervous/anxious.   Rest see pertinent positives and negatives per HPI.   Current Outpatient Medications on File Prior to Visit  Medication Sig Dispense Refill  . ALPRAZolam (XANAX) 1 MG tablet Take  1 mg by mouth 4 (four) times daily.    . budesonide-formoterol (SYMBICORT) 160-4.5 MCG/ACT inhaler Inhale 2 puffs into the lungs 2 (two) times daily. (Patient taking differently: Inhale 2 puffs into the lungs 2 (two) times daily as needed (respiratory issues.). ) 2 Inhaler 0  . busPIRone (BUSPAR) 10 MG tablet Take 10 mg by mouth 2 (two) times daily. Morning & afternoon    . FDGARD 25-20.75 MG CAPS Take 2 capsules by mouth 2 (two) times daily. 30 minutes before lunch & supper    . hydrOXYzine (VISTARIL) 50 MG capsule Take 50 mg by mouth 2 (two) times daily. In the afternoon & at bedtime.    . lamoTRIgine (LAMICTAL)  200 MG tablet Take 200 mg by mouth daily.    Marland Kitchen. omeprazole (PRILOSEC) 20 MG capsule Take 20 mg by mouth 2 (two) times daily.    Marland Kitchen. oxymetazoline (AFRIN) 0.05 % nasal spray Place 1 spray into both nostrils 2 (two) times daily as needed for congestion. [employer] Severe Congestion Nasal    . Probiotic Product (ALIGN) 4 MG CAPS Take 4 mg by mouth daily.    . risperiDONE (RISPERDAL) 1 MG tablet Take 1 mg by mouth daily.     Marland Kitchen. rOPINIRole (REQUIP) 1 MG tablet Take 1 mg by mouth at bedtime.     No current facility-administered medications on file prior to visit.      Past Medical History:  Diagnosis Date  . Alcohol dependence (HCC)   . Bipolar disorder (HCC)   . Chronic back pain   . Ectopic pregnancy    June 2013  . Fatty liver- NAFLD vs EtOH or both 03/07/2019  . Iron deficiency anemia due to chronic blood loss 03/07/2019  . Multiple personalities (HCC)   . Ovarian cyst   . Panic attacks   . Persistent vomiting 03/07/2019  . PTSD (post-traumatic stress disorder)   . Seizure disorder (HCC)    Allergies  Allergen Reactions  . Rocephin [Ceftriaxone Sodium In Dextrose] Anaphylaxis    Family History  Problem Relation Age of Onset  . High blood pressure Mother   . Irritable bowel syndrome Mother   . Diabetes Father   . Heart disease Father   . High blood pressure Father   . Other Father        fatty liver  . Colon cancer Neg Hx   . Esophageal cancer Neg Hx     Social History   Socioeconomic History  . Marital status: Single    Spouse name: Not on file  . Number of children: Not on file  . Years of education: Not on file  . Highest education level: Not on file  Occupational History  . Not on file  Social Needs  . Financial resource strain: Not on file  . Food insecurity    Worry: Not on file    Inability: Not on file  . Transportation needs    Medical: Not on file    Non-medical: Not on file  Tobacco Use  . Smoking status: Current Every Day Smoker    Packs/day: 0.50     Years: 23.00    Pack years: 11.50    Types: Cigarettes  . Smokeless tobacco: Never Used  Substance and Sexual Activity  . Alcohol use: Not Currently    Alcohol/week: 18.0 standard drinks    Types: 10 Cans of beer, 8 Shots of liquor per week    Comment: stopped 2 weeks ago  . Drug use: Not Currently  .  Sexual activity: Yes    Birth control/protection: None  Lifestyle  . Physical activity    Days per week: Not on file    Minutes per session: Not on file  . Stress: Not on file  Relationships  . Social Musician on phone: Not on file    Gets together: Not on file    Attends religious service: Not on file    Active member of club or organization: Not on file    Attends meetings of clubs or organizations: Not on file    Relationship status: Not on file  Other Topics Concern  . Not on file  Social History Narrative  . Not on file    Vitals:   04/12/19 0805  BP: 130/78  Resp: 16  Temp: (!) 96.2 F (35.7 C)    Body mass index is 46.51 kg/m.   Physical Exam  Nursing note and vitals reviewed. Constitutional: She is oriented to person, place, and time. She appears well-developed. No distress.  HENT:  Head: Normocephalic and atraumatic.  Mouth/Throat: Oropharynx is clear and moist and mucous membranes are normal.  Eyes: Pupils are equal, round, and reactive to light. Conjunctivae are normal.  Cardiovascular: Normal rate and regular rhythm.  No murmur heard. Pulses:      Dorsalis pedis pulses are 2+ on the right side and 2+ on the left side.  Respiratory: Effort normal and breath sounds normal. No respiratory distress.  GI: Soft. She exhibits no mass. There is no hepatomegaly. There is generalized abdominal tenderness. There is no rigidity, no rebound and no guarding.  Musculoskeletal:        General: No edema.  Lymphadenopathy:    She has no cervical adenopathy.  Neurological: She is alert and oriented to person, place, and time. She has normal strength. No  cranial nerve deficit. Gait normal.  Skin: Skin is warm. No rash noted. No erythema.  Psychiatric: Her mood appears anxious.  Well groomed, good eye contact.    ASSESSMENT AND PLAN:  Ms. Rosaleigh was seen today for establish care.  Diagnoses and all orders for this visit:  Orders Placed This Encounter  Procedures  . Flu Vaccine QUAD 36+ mos IM  . CBC  . Hemoglobin A1c  . Lipid panel   Lab Results  Component Value Date   CHOL 152 04/12/2019   HDL 33.80 (L) 04/12/2019   LDLCALC 79 04/12/2019   TRIG 198.0 (H) 04/12/2019   CHOLHDL 4 04/12/2019   Lab Results  Component Value Date   WBC 9.0 04/12/2019   HGB 7.9 Repeated and verified X2. (LL) 04/12/2019   HCT 26.0 (L) 04/12/2019   MCV 60.8 Repeated and verified X2. (L) 04/12/2019   PLT 360.0 04/12/2019   Lab Results  Component Value Date   HGBA1C 5.9 04/12/2019    Fatty liver- NAFLD vs EtOH or both Alcohol avoidance and weight loss strongly recommended. Continue following with gastroenterologist.  Iron deficiency anemia due to chronic blood loss - menstrual Most likely related to dysfunctional uterine bleeding. Continue iron supplementation. Further recommendation will be given according to lab results. Blood transfusion would be recommended if Hg < 7.0. Instructed about warning signs.  DUB (dysfunctional uterine bleeding) She is considering IUD Mirena. Following with gynecologist.   Obesity, morbid (HCC) We discussed benefits of wt loss as well as adverse effects of obesity. Consistency with healthy diet and physical activity recommended. Waiting for appt with nutritionist.   Diabetes mellitus screening -  Cancel: Basic metabolic panel -     Hemoglobin A1c  Screening for lipid disorders -     Lipid panel  Need for immunization against influenza -     Flu Vaccine QUAD 36+ mos IM    Return in about 1 year (around 04/11/2020) for cpe, before if needed..     Betty G. [place], MD  [facility]. [place] office.

## 2019-04-12 NOTE — Patient Instructions (Addendum)
It was nice to meet you. A few things to remember from today's visit:    Diabetes mellitus screening - Plan: Basic metabolic panel, Hemoglobin A1c  Screening for lipid disorders - Plan: Lipid panel  Iron deficiency anemia due to chronic blood loss - menstrual - Plan: CBC  Continue following with gastro, psychiatrist, and gynecologist. I will be glad to see you if you develop an acute illness, otherwise I can see you annually. Let me know when you are ready to quit smoking, we can try nicotine products.  Continue checking on appointment with dietitian, most patients are on a waiting list.  Please be sure medication list is accurate. If a new problem present, please set up appointment sooner than planned today.

## 2019-04-12 NOTE — Assessment & Plan Note (Signed)
Most likely related to dysfunctional uterine bleeding. Continue iron supplementation. Further recommendation will be given according to lab results. Blood transfusion would be recommended if Hg < 7.0. Instructed about warning signs.

## 2019-04-12 NOTE — Assessment & Plan Note (Signed)
She is considering IUD Mirena. Following with gynecologist.

## 2019-04-12 NOTE — Assessment & Plan Note (Signed)
We discussed benefits of wt loss as well as adverse effects of obesity. Consistency with healthy diet and physical activity recommended. Waiting for appt with nutritionist.

## 2019-05-30 ENCOUNTER — Ambulatory Visit: Payer: Self-pay | Admitting: Internal Medicine

## 2019-06-08 ENCOUNTER — Ambulatory Visit: Payer: Medicaid Other | Attending: Internal Medicine

## 2019-06-08 DIAGNOSIS — Z20822 Contact with and (suspected) exposure to covid-19: Secondary | ICD-10-CM

## 2019-06-10 LAB — NOVEL CORONAVIRUS, NAA: SARS-CoV-2, NAA: NOT DETECTED

## 2019-07-10 ENCOUNTER — Other Ambulatory Visit: Payer: Medicaid Other

## 2019-07-12 ENCOUNTER — Ambulatory Visit: Payer: Self-pay | Admitting: Internal Medicine

## 2019-07-24 ENCOUNTER — Ambulatory Visit: Payer: Medicaid Other | Attending: Internal Medicine

## 2019-08-24 ENCOUNTER — Telehealth: Payer: Medicaid Other | Admitting: Physician Assistant

## 2019-08-24 DIAGNOSIS — R112 Nausea with vomiting, unspecified: Secondary | ICD-10-CM

## 2019-08-24 NOTE — Progress Notes (Signed)
Based on what you shared with me, I feel your condition warrants further evaluation and I recommend that you be seen for a face to face office visit.  Given your vomiting is associated with focal abdominal pains as well as chest pains, I would recommend face to face evaluation. You may require labwork and imaging to better understand what is causing your symptoms and can be given medications to help control your symptoms.    NOTE: If you entered your credit card information for this eVisit, you will not be charged. You may see a "hold" on your card for the $35 but that hold will drop off and you will not have a charge processed.   If you are having a true medical emergency please call 911.      For an urgent face to face visit, [place] has five urgent care centers for your convenience:      NEW:  [facility] at [facility] Directions [phone] [address] [place], [place] [address] . 10 am - 6pm Monday - Friday    [facility]) Get Driving Directions [phone] [address] [place], [place] [address] . 10 am to 8 pm Monday-Friday . 12 pm to 8 pm [facility] Urgent Care at [facility] Get Driving Directions [phone] [address] [place], [place] [address] . 8 am to 8 pm Monday-Friday . 9 am to 6 pm Saturday . 11 am to 6 pm Sunday     [facility] at [facility] Get Driving Directions  [phone] [address] [place], [place] [address] . 8 am to 8 pm Monday-Friday . 8 am to 4 pm [facility] Urgent Care at [facility] Directions [phone] [address] [place], [place] [address] . 12 pm to 6 pm Monday-Friday      Your e-visit answers were reviewed by a board certified advanced clinical practitioner to complete your personal care plan.  Thank you for using e-Visits.    Greater than 5 minutes, yet less than 10 minutes of time have been spent researching, coordinating, and implementing care for this patient today.

## 2019-08-28 ENCOUNTER — Encounter: Payer: Self-pay | Admitting: Family Medicine

## 2019-08-28 ENCOUNTER — Telehealth (INDEPENDENT_AMBULATORY_CARE_PROVIDER_SITE_OTHER): Payer: Medicaid Other | Admitting: Family Medicine

## 2019-08-28 VITALS — Ht 68.5 in

## 2019-08-28 DIAGNOSIS — R519 Headache, unspecified: Secondary | ICD-10-CM

## 2019-08-28 DIAGNOSIS — M549 Dorsalgia, unspecified: Secondary | ICD-10-CM

## 2019-08-28 DIAGNOSIS — N938 Other specified abnormal uterine and vaginal bleeding: Secondary | ICD-10-CM

## 2019-08-28 DIAGNOSIS — R112 Nausea with vomiting, unspecified: Secondary | ICD-10-CM

## 2019-08-28 MED ORDER — TIZANIDINE HCL 4 MG PO TABS: 4.0000 mg | ORAL_TABLET | Freq: Three times a day (TID) | ORAL | 0 refills | Status: DC | PRN

## 2019-08-28 MED ORDER — PREDNISONE 20 MG PO TABS: ORAL_TABLET | ORAL | 0 refills | Status: AC

## 2019-08-28 NOTE — Progress Notes (Signed)
Virtual Visit via Video Note   I connected with Evelyn Lopez on 08/28/19 by a video enabled telemedicine application and verified that I am speaking with the correct person using two identifiers.  Location patient: home Location provider:office Persons participating in the virtual visit: patient, provider  I discussed the limitations of evaluation and management by telemedicine and the availability of in person appointments. The patient expressed understanding and agreed to proceed.  HPI: Evelyn Lopez is a 38 years old female with history of tobacco use, bipolar disorder, iron deficiency anemia, and chronic back pain among some complaining about a couple weeks of right-sided low back pain radiated to right upper back, pain is constant, 10/10. She denies having this type of back pain in the past. Negative for saddle anesthesia or bladder/bowel dysfunction. Pain is sometimes radiated to right buttock and intermittent anterior knee numbness.  No associated fever,chills,local edema,or erythema.  She had a fall a week ago but already had pain.  She has taken Tylenol, which is not helping. Pain is exacerbated by movement. She has not identified alleviating factors.  Noted a "lump" right side of abdomen, abdominal boating,N/V,and frequent stools. Nausea and vomiting exacerbated by food intake.  Vomiting today 0 Yesterday: 2-3 times More frequent stools x 2 today and yesterday 4-5. No blood in stool. She follows with GI, she has an appointment on 08/31/19.   She had an E-visit on 08/24/19 because nausea,vomiting,headache,and mild cough. Negative for nasal congestion,sore throat , or dysphagia.  Mentions having "enormous" headache for a week. States that she has no hx of headaches. She thinks it may be related to upper back pain. Parietal and occipital headache. + Photophobia. No associated Evelyn changes or focal deficit. Being in a dark room seems to help.  Problems are stable.  States that  she can not take NSAIDs because history of "early cirrhosis." Hx of alcohol dependency.  Lab Results  Component Value Date   ALT 29 03/21/2019   AST 42 (H) 03/21/2019   ALKPHOS 83 03/21/2019   BILITOT 0.4 03/21/2019    Upon further questioning she reports blood on tissue upon wipe after urination. Vaginal bleeding "blood clot", not uncommon Hx of DUB.  No sexually active.  ROS: See pertinent positives and negatives per HPI.  Past Medical History:  Diagnosis Date  . Alcohol dependence (HCC)   . Bipolar disorder (HCC)   . Chronic back pain   . Ectopic pregnancy    June 2013  . Fatty liver- NAFLD vs EtOH or both 03/07/2019  . Iron deficiency anemia due to chronic blood loss 03/07/2019  . Multiple personalities (HCC)   . Ovarian cyst   . Panic attacks   . Persistent vomiting 03/07/2019  . PTSD (post-traumatic stress disorder)   . Seizure disorder [facility])    Past Surgical History:  Procedure Laterality Date  . COLONOSCOPY    . ESOPHAGOGASTRODUODENOSCOPY    . ESOPHAGOGASTRODUODENOSCOPY (EGD) WITH PROPOFOL N/A 03/07/2019   Procedure: ESOPHAGOGASTRODUODENOSCOPY (EGD) WITH PROPOFOL;  Surgeon: Iva Boop, MD;  Location: [facility] ENDOSCOPY;  Service: Endoscopy;  Laterality: N/A;  . LAPAROSCOPY  12/29/2011   Procedure: LAPAROSCOPY OPERATIVE;  Surgeon: Tresa Endo A. Ernestina Penna, MD;  Location: [facility] ORS;  Service: Gynecology;  Laterality: Bilateral;  Right Salpingectomy  . SALPINGOOPHORECTOMY Right    Family History  Problem Relation Age of Onset  . High blood pressure Mother   . Irritable bowel syndrome Mother   . Diabetes Father   . Heart disease Father   . High  blood pressure Father   . Other Father        fatty liver  . Colon cancer Neg Hx   . Esophageal cancer Neg Hx     Social History   Socioeconomic History  . Marital status: Single    Spouse name: Not on file  . Number of children: Not on file  . Years of education: Not on file  . Highest education level: Not on file   Occupational History  . Not on file  Tobacco Use  . Smoking status: Current Every Day Smoker    Packs/day: 0.50    Years: 23.00    Pack years: 11.50    Types: Cigarettes  . Smokeless tobacco: Never Used  Substance and Sexual Activity  . Alcohol use: Not Currently    Alcohol/week: 18.0 standard drinks    Types: 10 Cans of beer, 8 Shots of liquor per week    Comment: stopped 2 weeks ago  . Drug use: Not Currently  . Sexual activity: Yes    Birth control/protection: None  Other Topics Concern  . Not on file  Social History Narrative  . Not on file   Social Determinants of Health   Financial Resource Strain:   . Difficulty of Paying Living Expenses:   Food Insecurity:   . Worried About Charity fundraiser in the Last Year:   . Arboriculturist in the Last Year:   Transportation Needs:   . Film/video editor (Medical):   Marland Kitchen Lack of Transportation (Non-Medical):   Physical Activity:   . Days of Exercise per Week:   . Minutes of Exercise per Session:   Stress:   . Feeling of Stress :   Social Connections:   . Frequency of Communication with Friends and Family:   . Frequency of Social Gatherings with Friends and Family:   . Attends Religious Services:   . Active Member of Clubs or Organizations:   . Attends Archivist Meetings:   Marland Kitchen Marital Status:   Intimate Partner Violence:   . Fear of Current or Ex-Partner:   . Emotionally Abused:   Marland Kitchen Physically Abused:   . Sexually Abused:     Current Outpatient Medications:  .  ALPRAZolam (XANAX) 1 MG tablet, Take 1 mg by mouth 4 (four) times daily., Disp: , Rfl:  .  busPIRone (BUSPAR) 10 MG tablet, Take 10 mg by mouth 2 (two) times daily. Morning & afternoon, Disp: , Rfl:  .  FDGARD 25-20.75 MG CAPS, Take 2 capsules by mouth 2 (two) times daily. 30 minutes before lunch & supper, Disp: , Rfl:  .  hydrOXYzine (VISTARIL) 50 MG capsule, Take 50 mg by mouth 2 (two) times daily. In the afternoon & at bedtime., Disp: , Rfl:   .  lamoTRIgine (LAMICTAL) 200 MG tablet, Take 200 mg by mouth daily., Disp: , Rfl:  .  omeprazole (PRILOSEC) 20 MG capsule, Take 20 mg by mouth 2 (two) times daily., Disp: , Rfl:  .  oxymetazoline (AFRIN) 0.05 % nasal spray, Place 1 spray into both nostrils 2 (two) times daily as needed for congestion. [employer] Severe Congestion Nasal, Disp: , Rfl:  .  Probiotic Product (ALIGN) 4 MG CAPS, Take 4 mg by mouth daily., Disp: , Rfl:  .  risperiDONE (RISPERDAL) 1 MG tablet, Take 1 mg by mouth daily. , Disp: , Rfl:  .  rOPINIRole (REQUIP) 1 MG tablet, Take 1 mg by mouth at bedtime., Disp: , Rfl:  .  predniSONE (DELTASONE) 20 MG tablet, 3 tabs for 3 days, 2 tabs for 3 days, 1 tabs for 3 days, and 1/2 tab for 3 days. Take tables together with breakfast., Disp: 20 tablet, Rfl: 0 .  tiZANidine (ZANAFLEX) 4 MG tablet, Take 1 tablet (4 mg total) by mouth every 8 (eight) hours as needed for muscle spasms., Disp: 30 tablet, Rfl: 0  EXAM:  VITALS per patient if applicable:Ht 5' 8.5" (1.74 m)   BMI 46.51 kg/m   GENERAL: alert, oriented, appears well and in no acute distress  HEENT: atraumatic, conjunttiva clear, no obvious abnormalities on inspection.  NECK: normal movements of the head and neck  LUNGS: on inspection no signs of respiratory distress, breathing rate appears normal, no obvious gross SOB, gasping or wheezing  CV: no obvious cyanosis  Evelyn: moves all visible extremities without noticeable abnormality  PSYCH/NEURO: pleasant and cooperative, no obvious depression, + anxious. Speech and thought processing grossly intact  ASSESSMENT AND PLAN:  Discussed the following assessment and plan:  Acute right-sided back pain, unspecified back location - Plan: C-reactive protein Hx of back pain but this pain is reported as new. ? Radiculopathy,right knee numbness, so I recommend Prednisone taper. She has taken prednisone in the past, she is not reporting any side effect. After discussion of  possible side effects, including GI, she agrees with trying prednisone, recommend taking it with breakfast. Side effects of Zanaflex also discussed.  If the numbness is still present in 4 to 6 weeks, lumbar MRI will be warranted.  Nausea and vomiting in adult - Plan: Basic metabolic panel, C-reactive protein, TSH ? Part of GI symptomatology or headache. She has seen GI for persistent vomiting, 02/2019.  ? Dyspepsia,she is on Omeprazole. Gallbladder disease to be considered.  She has appt with GI.  Headache, unspecified headache type We discussed possible etiologies. ? Tension headache vs migraine. I do not think head imaging is needed at this time but needs to be considered if persistent or gets worse. Instructed about warning signs.  DUB (dysfunctional uterine bleeding) - Plan: TSH This is a chronic problem. Will benefit from IUD. She needs to follow with Gyn.   I discussed the assessment and treatment plan with the patient. Evelyn Basulto was provided an opportunity to ask questions and all were answered. She agreed with the plan and demonstrated an understanding of the instructions.    Return in about 4 weeks (around 09/25/2019) for HA.    Aqil Goetting [place], MD

## 2019-08-29 ENCOUNTER — Telehealth: Payer: Self-pay | Admitting: Family Medicine

## 2019-08-29 NOTE — Telephone Encounter (Signed)
LVM for pt to return call to set up 4 week follow up appt

## 2019-08-29 NOTE — Telephone Encounter (Signed)
-----   Message from Latanya Presser sent at 08/29/2019 12:09 PM EDT -----  ----- Message ----- From: [place], Betty G, MD Sent: 08/28/2019   7:32 PM EDT To: Latanya Presser

## 2019-08-31 ENCOUNTER — Ambulatory Visit: Payer: Medicaid Other | Admitting: Nurse Practitioner

## 2019-09-01 NOTE — Telephone Encounter (Signed)
LVM to set up 4 week f.u--last call

## 2019-09-07 ENCOUNTER — Ambulatory Visit: Payer: Medicaid Other

## 2019-09-08 ENCOUNTER — Other Ambulatory Visit: Payer: Self-pay | Admitting: Family Medicine

## 2019-09-08 MED ORDER — TIZANIDINE HCL 4 MG PO TABS: 4.0000 mg | ORAL_TABLET | Freq: Three times a day (TID) | ORAL | 0 refills | Status: DC | PRN

## 2019-09-21 ENCOUNTER — Ambulatory Visit: Payer: Medicaid Other | Admitting: Nurse Practitioner

## 2019-09-25 ENCOUNTER — Ambulatory Visit: Payer: Medicaid Other

## 2019-10-02 ENCOUNTER — Ambulatory Visit: Payer: Self-pay

## 2019-10-06 ENCOUNTER — Ambulatory Visit: Payer: Medicaid Other | Attending: Internal Medicine

## 2019-10-06 ENCOUNTER — Encounter (HOSPITAL_COMMUNITY): Payer: Self-pay | Admitting: Emergency Medicine

## 2019-10-06 ENCOUNTER — Encounter: Payer: Self-pay | Admitting: Family Medicine

## 2019-10-06 ENCOUNTER — Other Ambulatory Visit: Payer: Self-pay

## 2019-10-06 ENCOUNTER — Emergency Department (HOSPITAL_COMMUNITY)
Admission: EM | Admit: 2019-10-06 | Discharge: 2019-10-06 | Disposition: A | Discharge status: Home / Self Care | Payer: Medicaid Other | Attending: Emergency Medicine | Admitting: Emergency Medicine

## 2019-10-06 DIAGNOSIS — R42 Dizziness and giddiness: Secondary | ICD-10-CM | POA: Insufficient documentation

## 2019-10-06 DIAGNOSIS — T782XXA Anaphylactic shock, unspecified, initial encounter: Secondary | ICD-10-CM | POA: Insufficient documentation

## 2019-10-06 DIAGNOSIS — Z23 Encounter for immunization: Secondary | ICD-10-CM

## 2019-10-06 DIAGNOSIS — D649 Anemia, unspecified: Secondary | ICD-10-CM | POA: Insufficient documentation

## 2019-10-06 DIAGNOSIS — Z79899 Other long term (current) drug therapy: Secondary | ICD-10-CM | POA: Insufficient documentation

## 2019-10-06 DIAGNOSIS — F17210 Nicotine dependence, cigarettes, uncomplicated: Secondary | ICD-10-CM | POA: Insufficient documentation

## 2019-10-06 DIAGNOSIS — Z20822 Contact with and (suspected) exposure to covid-19: Secondary | ICD-10-CM | POA: Insufficient documentation

## 2019-10-06 DIAGNOSIS — R110 Nausea: Secondary | ICD-10-CM | POA: Insufficient documentation

## 2019-10-06 DIAGNOSIS — R0602 Shortness of breath: Secondary | ICD-10-CM | POA: Insufficient documentation

## 2019-10-06 LAB — COMPREHENSIVE METABOLIC PANEL
ALT: 28 U/L (ref 0–44)
AST: 44 U/L — ABNORMAL HIGH (ref 15–41)
Albumin: 4.1 g/dL (ref 3.5–5.0)
Alkaline Phosphatase: 92 U/L (ref 38–126)
Anion gap: 9 (ref 5–15)
BUN: 12 mg/dL (ref 6–20)
CO2: 25 mmol/L (ref 22–32)
Calcium: 8.9 mg/dL (ref 8.9–10.3)
Chloride: 107 mmol/L (ref 98–111)
Creatinine, Ser: 0.94 mg/dL (ref 0.44–1.00)
GFR calc Af Amer: >60 mL/min (ref >60)
GFR calc non Af Amer: >60 mL/min (ref >60)
Glucose, Bld: 107 mg/dL — ABNORMAL HIGH (ref 70–99)
Potassium: 4 mmol/L (ref 3.5–5.1)
Sodium: 141 mmol/L (ref 135–145)
Total Bilirubin: 0.6 mg/dL (ref 0.3–1.2)
Total Protein: 7.4 g/dL (ref 6.5–8.1)

## 2019-10-06 LAB — CBC WITH DIFFERENTIAL/PLATELET
Abs Immature Granulocytes: 0.05 10*3/uL (ref 0.00–0.07)
Basophils Absolute: 0.1 10*3/uL (ref 0.0–0.1)
Basophils Relative: 1 %
Eosinophils Absolute: 0.5 10*3/uL (ref 0.0–0.5)
Eosinophils Relative: 5 %
HCT: 30.8 % — ABNORMAL LOW (ref 36.0–46.0)
Hemoglobin: 8.4 g/dL — ABNORMAL LOW (ref 12.0–15.0)
Immature Granulocytes: 1 %
Lymphocytes Relative: 30 %
Lymphs Abs: 2.6 10*3/uL (ref 0.7–4.0)
MCH: 17.8 pg — ABNORMAL LOW (ref 26.0–34.0)
MCHC: 27.3 g/dL — ABNORMAL LOW (ref 30.0–36.0)
MCV: 65.3 fL — ABNORMAL LOW (ref 80.0–100.0)
Monocytes Absolute: 0.7 10*3/uL (ref 0.1–1.0)
Monocytes Relative: 8 %
Neutro Abs: 4.9 10*3/uL (ref 1.7–7.7)
Neutrophils Relative %: 55 %
Platelets: 463 10*3/uL — ABNORMAL HIGH (ref 150–400)
RBC: 4.72 MIL/uL (ref 3.87–5.11)
RDW: 20.6 % — ABNORMAL HIGH (ref 11.5–15.5)
WBC: 8.7 10*3/uL (ref 4.0–10.5)
nRBC: 0 % (ref 0.0–0.2)

## 2019-10-06 LAB — RESPIRATORY PANEL BY RT PCR (FLU A&B, COVID)
Influenza A by PCR: NEGATIVE
Influenza B by PCR: NEGATIVE
SARS Coronavirus 2 by RT PCR: NEGATIVE

## 2019-10-06 MED ORDER — METHYLPREDNISOLONE SODIUM SUCC 125 MG IJ SOLR
125.0000 mg | Freq: Once | INTRAMUSCULAR | Status: AC
  Administered 2019-10-06: 125 mg via INTRAVENOUS
  Filled 2019-10-06: qty 2

## 2019-10-06 MED ORDER — EPINEPHRINE 0.3 MG/0.3ML IJ SOAJ
0.3000 mg | Freq: Once | INTRAMUSCULAR | Status: AC
  Administered 2019-10-06: 0.3 mg via INTRAMUSCULAR

## 2019-10-06 MED ORDER — EPINEPHRINE 0.3 MG/0.3ML IJ SOAJ
INTRAMUSCULAR | Status: AC
  Filled 2019-10-06: qty 0.3

## 2019-10-06 MED ORDER — EPINEPHRINE 0.3 MG/0.3ML IJ SOAJ: 0.3000 mg | INTRAMUSCULAR | 1 refills | Status: DC | PRN

## 2019-10-06 MED ORDER — PREDNISONE 10 MG PO TABS: 50.0000 mg | ORAL_TABLET | Freq: Every day | ORAL | 0 refills | Status: DC

## 2019-10-06 MED ORDER — FAMOTIDINE IN NACL 20-0.9 MG/50ML-% IV SOLN
20.0000 mg | Freq: Once | INTRAVENOUS | Status: AC
  Administered 2019-10-06: 20 mg via INTRAVENOUS
  Filled 2019-10-06: qty 50

## 2019-10-06 MED ORDER — DIPHENHYDRAMINE HCL 50 MG/ML IJ SOLN
50.0000 mg | Freq: Once | INTRAMUSCULAR | Status: AC
  Administered 2019-10-06: 17:00:00 50 mg via INTRAVENOUS
  Filled 2019-10-06: qty 1

## 2019-10-06 MED ORDER — SODIUM CHLORIDE 0.9 % IV BOLUS
1000.0000 mL | Freq: Once | INTRAVENOUS | Status: AC
  Administered 2019-10-06: 17:00:00 1000 mL via INTRAVENOUS

## 2019-10-06 MED ORDER — MIDAZOLAM HCL 2 MG/2ML IJ SOLN
1.0000 mg | Freq: Once | INTRAMUSCULAR | Status: AC
  Administered 2019-10-06: 17:00:00 1 mg via INTRAVENOUS
  Filled 2019-10-06: qty 2

## 2019-10-06 MED ORDER — ALBUTEROL SULFATE HFA 108 (90 BASE) MCG/ACT IN AERS
4.0000 | INHALATION_SPRAY | RESPIRATORY_TRACT | Status: AC
  Administered 2019-10-06 (×2): 4 via RESPIRATORY_TRACT
  Filled 2019-10-06: qty 6.7

## 2019-10-06 MED ORDER — EPINEPHRINE 0.3 MG/0.3ML IJ SOAJ
0.3000 mg | Freq: Once | INTRAMUSCULAR | Status: AC
  Administered 2019-10-06: 17:00:00 0.3 mg via INTRAMUSCULAR
  Filled 2019-10-06: qty 0.3

## 2019-10-06 NOTE — Progress Notes (Addendum)
   Covid-19 Vaccination Clinic  Name:  Evelyn Lopez    MRN: [id number] DOB: 10-Jun-1981  10/06/2019  Ms. Gongora was observed post Covid-19 immunization for 30 minutes based on pre-vaccination screening .  During the observation period, she experienced an adverse reaction with the following symptoms:  anaphylaxis, difficulty breathing, dizziness, nausea and rapid heart rate.  Assessment : Time of assessment 1515. Alert and oriented, Anxious, Dyspnea, Tachycardia and Nausea. 15 minutes into observation.  Actions taken:  Vitals sign taken  EMS on site and hand off completed to Paramedic (Name Abby Potash). VAERS form completed  BP: 147/118 SPO2: 79% on Room Air Heart Rate: 126  Medications administered: No medication administered.  Disposition:Patient evaluated and transferred to the hospital for further evaluation and care. Time of transport: 1537.   Immunizations Administered    Name Date Dose VIS Date Route   Pfizer COVID-19 Vaccine 10/06/2019  3:01 PM 0.3 mL 08/09/2018 Intramuscular   Manufacturer: [employer], [employer]   Lot: [id number]   NDC: [address]-0     Patient began coughing. This RN offered patient a bottle of water but patient signaled she could not swallow. This RN then obtained vital signs for this patient which are listed above. EMT notified immediately. Report and care transferred to EMT.

## 2019-10-06 NOTE — ED Triage Notes (Addendum)
Arrives via EMS [facility] [place] [facility], C/C allergic reaction to [employer] vaccine. Staff gave an epi pen and put her on a non-rebreather. Patient at 100% RA. Wheezing in all fields. BP 128/80 with EMS.

## 2019-10-06 NOTE — Discharge Instructions (Signed)
We saw you in the ER after you had the allergic reaction.  The reaction is severe, however, it appears to be in control and there is no increased swelling or any difficulty in breathing noted. We are not sure what caused the reaction, and it is important for you to follow up with an allergist. Please take the medications prescribed. PLEASE RETURN TO THE ER IMMEDIATELY IN CASE YOU START HAVING WORSENING SWELLING, DIFFICULTY IN BREATHING ETC.  

## 2019-10-06 NOTE — ED Provider Notes (Signed)
[facility] DEPT Provider Note   CSN: [id number] Arrival date & time: 10/06/19  1604     History Chief Complaint  Patient presents with  . allergic reaction to Pfizer vaccine    Evelyn Lopez is a 39 y.o. female.  HPI     39 year old female comes in a chief complaint of allergic reaction. 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  She reports that about 15 minutes after she received her first [place] dose, she started getting dizzy, short of breath.  Patient feels like her throat is burning and she is having difficulty getting air in.  She was brought here by EMS and she has received 1 round of IM epi in route.   Review of system is positive for nausea.  She reports chronic abdominal pain, no rash, no itching.  Patient has no underlying lung disease.  She has a history of anxiety, however she is unsure if there is a panic component going on at this time.  She was calm prior to sudden onset of the symptoms.  Past Medical History:  Diagnosis Date  . Alcohol dependence ([place])   . Bipolar disorder ([place])   . Chronic back pain   . Ectopic pregnancy    June 2013  . Fatty liver- NAFLD vs EtOH or both 03/07/2019  . Iron deficiency anemia due to chronic blood loss 03/07/2019  . 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 Ovarian cyst   . Panic attacks   . Persistent vomiting 03/07/2019  . PTSD (post-traumatic stress disorder)   . Seizure disorder [facility])     Patient Active Problem List   Diagnosis Date Noted  . Obesity, morbid ([place]) 04/12/2019  . DUB (dysfunctional uterine bleeding) 04/12/2019  . Persistent vomiting 03/07/2019  . Iron deficiency anemia due to chronic blood loss - menstrual 03/07/2019  . Fatty liver- NAFLD vs EtOH or both 03/07/2019  . Panic disorder 08/02/2017  . Substance use disorder 08/02/2017  . Other bipolar disorder ([place]) 08/01/2017    Past Surgical History:  Procedure Laterality Date  . COLONOSCOPY    .  ESOPHAGOGASTRODUODENOSCOPY    . ESOPHAGOGASTRODUODENOSCOPY (EGD) WITH PROPOFOL N/A 03/07/2019   Procedure: ESOPHAGOGASTRODUODENOSCOPY (EGD) WITH PROPOFOL;  Surgeon: Gatha Mayer, MD;  Location: [facility];  Service: Endoscopy;  Laterality: N/A;  . LAPAROSCOPY  12/29/2011   Procedure: LAPAROSCOPY OPERATIVE;  Surgeon: Claiborne Billings A. Pamala Hurry, MD;  Location: [facility];  Service: Gynecology;  Laterality: Bilateral;  Right Salpingectomy  . SALPINGOOPHORECTOMY Right      OB History    Gravida  2   Para  0   Term  0   Preterm  0   AB  0   Living  0     SAB  0   TAB  0   Ectopic  0   Multiple  0   Live Births              Family History  Problem Relation Age of Onset  . High blood pressure Mother   . Irritable bowel syndrome Mother   . Diabetes Father   . Heart disease Father   . High blood pressure Father   . Other Father        fatty liver  . Colon cancer Neg Hx   . Esophageal cancer Neg Hx     Social History   Tobacco Use  . Smoking status: Current Every Day Smoker    Packs/day: 0.50    Years:  23.00    Pack years: 11.50    Types: Cigarettes  . Smokeless tobacco: Never Used  Substance Use Topics  . Alcohol use: Not Currently    Alcohol/week: 18.0 standard drinks    Types: 10 Cans of beer, 8 Shots of liquor per week    Comment: stopped 2 weeks ago  . Drug use: Not Currently    Home Medications Prior to Admission medications   Medication Sig Start Date End Date Taking? Authorizing Provider  ALPRAZolam Prudy Feeler) 1 MG tablet Take 1 mg by mouth 4 (four) times daily.   Yes [provider]  busPIRone (BUSPAR) 10 MG tablet Take 10 mg by mouth 2 (two) times daily. Morning & afternoon 02/14/19  Yes [provider]  FDGARD 25-20.75 MG CAPS Take 2 capsules by mouth 2 (two) times daily. 30 minutes before lunch & supper   Yes [provider]  hydrOXYzine (VISTARIL) 50 MG capsule Take 50 mg by mouth 2 (two) times daily. In the afternoon & at  bedtime. 02/20/19  Yes [provider]  lamoTRIgine (LAMICTAL) 200 MG tablet Take 200 mg by mouth daily. 02/14/19  Yes [provider]  omeprazole (PRILOSEC) 20 MG capsule Take 20 mg by mouth 2 (two) times daily.   Yes [provider]  Probiotic Product (ALIGN) 4 MG CAPS Take 4 mg by mouth daily.   Yes [provider]  risperiDONE (RISPERDAL) 1 MG tablet Take 1 mg by mouth daily.  02/14/19  Yes [provider]  rOPINIRole (REQUIP) 1 MG tablet Take 1 mg by mouth at bedtime.   Yes [provider]  tiZANidine (ZANAFLEX) 4 MG tablet Take 1 tablet (4 mg total) by mouth every 8 (eight) hours as needed for muscle spasms. 09/08/19  Yes [place], Betty G, MD  EPINEPHrine 0.3 mg/0.3 mL IJ SOAJ injection Inject 0.3 mLs (0.3 mg total) into the muscle as needed for anaphylaxis. 10/06/19   Derwood Kaplan, MD  predniSONE (DELTASONE) 10 MG tablet Take 5 tablets (50 mg total) by mouth daily. 10/06/19   Derwood Kaplan, MD    Allergies    Rocephin [ceftriaxone sodium in dextrose]  Review of Systems   Review of Systems  Constitutional: Positive for activity change.  Respiratory: Positive for cough, shortness of breath and stridor. Negative for wheezing.   Cardiovascular: Positive for chest pain.  Gastrointestinal: Positive for nausea. Negative for vomiting.  Skin: Negative for rash.  Hematological: Does not bruise/bleed easily.    Physical Exam Updated Vital Signs BP (!) 169/107 (BP Location: Left Arm)   Pulse 93   Temp 97.7 F (36.5 C) (Oral)   Resp 19   Ht 5' 8.5" (1.74 m)   Wt (!) 140.8 kg   SpO2 98%   BMI 46.51 kg/m   Physical Exam Vitals and nursing note reviewed.  Constitutional:      Appearance: She is well-developed.  HENT:     Head: Normocephalic and atraumatic.  Cardiovascular:     Rate and Rhythm: Normal rate.  Pulmonary:     Effort: Pulmonary effort is normal.     Breath sounds: Stridor present. No wheezing.  Abdominal:      General: Bowel sounds are normal.  Musculoskeletal:     Cervical back: Normal range of motion and neck supple.  Skin:    General: Skin is warm and dry.     Findings: No rash.  Neurological:     Mental Status: She is alert and oriented to person, place, and time.  ED Results / Procedures / Treatments   Labs (all labs ordered are listed, but only abnormal results are displayed) Labs Reviewed  COMPREHENSIVE METABOLIC PANEL - Abnormal; Notable for the following components:      Result Value   Glucose, Bld 107 (*)    AST 44 (*)    All other components within normal limits  CBC WITH DIFFERENTIAL/PLATELET - Abnormal; Notable for the following components:   Hemoglobin 8.4 (*)    HCT 30.8 (*)    MCV 65.3 (*)    MCH 17.8 (*)    MCHC 27.3 (*)    RDW 20.6 (*)    Platelets 463 (*)    All other components within normal limits  RESPIRATORY PANEL BY RT PCR (FLU A&B, COVID)    EKG None  Radiology No results found.  Procedures .Critical Care Performed by: Derwood Kaplan, MD Authorized by: Derwood Kaplan, MD   Critical care provider statement:    Critical care time (minutes):  52   Critical care was necessary to treat or prevent imminent or life-threatening deterioration of the following conditions: Anaphylaxis.   Critical care was time spent personally by me on the following activities:  Discussions with consultants, evaluation of patient's response to treatment, examination of patient, ordering and performing treatments and interventions, ordering and review of laboratory studies, ordering and review of radiographic studies, pulse oximetry, re-evaluation of patient's condition, obtaining history from patient or surrogate and review of old charts   (including critical care time)  Medications Ordered in ED Medications  albuterol (VENTOLIN HFA) 108 (90 Base) MCG/ACT inhaler 4 puff (4 puffs Inhalation Given 10/06/19 1718)  EPINEPHrine (EPI-PEN) 0.3 mg/0.3 mL injection (has no  administration in time range)  EPINEPHrine (EPI-PEN) injection 0.3 mg (0.3 mg Intramuscular Given 10/06/19 1644)  methylPREDNISolone sodium succinate (SOLU-MEDROL) 125 mg/2 mL injection 125 mg (125 mg Intravenous Given 10/06/19 1631)  diphenhydrAMINE (BENADRYL) injection 50 mg (50 mg Intravenous Given 10/06/19 1631)  famotidine (PEPCID) IVPB 20 mg premix (0 mg Intravenous Stopped 10/06/19 1714)  EPINEPHrine (EPI-PEN) injection 0.3 mg (0.3 mg Intramuscular Given 10/06/19 1648)  sodium chloride 0.9 % bolus 1,000 mL (0 mLs Intravenous Stopped 10/06/19 1844)  midazolam (VERSED) injection 1 mg (1 mg Intravenous Given 10/06/19 1718)    ED Course  I have reviewed the triage vital signs and the nursing notes.  Pertinent labs & imaging results that were available during my care of the patient were reviewed by me and considered in my medical decision making (see chart for details).  Clinical Course as of Oct 05 1945  Fri Oct 06, 2019  1645 Patient has now received 2 rounds of IM epi in the ER along with IV Solu-Medrol, Benadryl and Pepcid. She is feeling better, but continues to have some irritation in her throat.  We will continue to monitor her.  IV Versed will be given as there is concerns from patient that there could be an element of anxiety as well.   [AN]  1946 Patient reassessed again.  She continues to feel better.  She wants to go home now.  Lung exam reveals no wheezing and patient has no stridulous breath sounds.  She is ready for discharge with appropriate return precautions.   [AN]    Clinical Course User Index [AN] Derwood Kaplan, MD   MDM Rules/Calculators/A&P                      39 year old comes in a chief complaint of allergic  reaction.  Clinically appears the patient is having anaphylaxis. + nausea, shortness of breath, throat irritation and stridulous breath sounds noted. Patient's blood pressure is fine.  No skin involvement.  There could be an element of anxiety as well,  however if this is anaphylaxis and no proven otherwise.  Initial meds ordered.  We will continue to reassess.    Final Clinical Impression(s) / ED Diagnoses Final diagnoses:  Anaphylaxis, initial encounter    Rx / DC Orders ED Discharge Orders         Ordered    EPINEPHrine 0.3 mg/0.3 mL IJ SOAJ injection  As needed     10/06/19 1945    predniSONE (DELTASONE) 10 MG tablet  Daily     10/06/19 1945           Derwood Kaplan, MD 10/06/19 1947

## 2019-10-06 NOTE — ED Notes (Signed)
Airway cart outside room.

## 2019-10-10 ENCOUNTER — Telehealth: Payer: Self-pay | Admitting: *Deleted

## 2019-10-10 NOTE — Telephone Encounter (Signed)
Called and spoke with patient and she stated that when she received her first Pfizer vaccine on 10/06/2019 she experienced anaphylaxis. Patient was taken to the hospital and treated for anaphylactic reaction. She experienced shortness of breath/ difficulty breathing, nausea, and tachycardia. When I asked the patient how she was doing she stated that she has a constant headache since the vaccine and her arm is still sore at the injection site. She stated that she is also having consistent diarrhea. The ER sent the patient home with an EpiPen and a 5 day prednisone taper. She states that her left hand and finger tips are still tingling. She states that she has taken a lot of Tylenol to help with her headaches but it is not helping. She states she is not able to take Ibuprofen due to fatty liver disease. Advised to patient that I would speak with a physician in regards to a future plan for her and she remarked that she will not be getting any future COVID vaccines of any kind. She states that she has had anaphylaxis to a Rocephin injection in the past and is very deterred from future vaccines or any other injections. Please advise.

## 2019-10-10 NOTE — Telephone Encounter (Signed)
Called and left a voicemail for patient asking for her to return phone call to discuss her first Pfizer vaccine that she received on 10/06/2019. MyChart message sent. Will follow up.

## 2019-10-12 NOTE — Telephone Encounter (Signed)
Called and left a voicemail asking for the patient to return call to discuss. Patient has medicaid so she would need to be referred to our office before we could do any type of office or telephone visit.

## 2019-10-12 NOTE — Telephone Encounter (Signed)
These are very concerning symptoms.  I would like to talk to her over the phone and clarify these more.  Think testing would be useful for her.   Malachi Bonds, MD [facility]

## 2019-10-12 NOTE — Telephone Encounter (Signed)
Patient called back and did seem interesting in having a telephone call with you. The only thing is that she has Medicaid Family Planning which is not covered by our insurance. I'm not sure if you are just wanting to call and speak with her or do you want this to be scheduled as an office visit, if that's the case than unfortunately she will not be able to be seen with [place] a patient or she would have to be self pay, which I'm not sure she may be able to afford or would be interested in. Please advise.

## 2019-10-13 NOTE — Telephone Encounter (Signed)
I can just talk to her without a visit. I probs do not have time today... maybe see what her plans are tonight? I could call her on my way home. Alternatively, maybe 8am on Tuesday?   Malachi Bonds, MD [facility]

## 2019-10-13 NOTE — Telephone Encounter (Signed)
Called and left a voicemail asking for patient to return call to inform and see what options she has to discuss her symptoms with Dr. Dellis Anes.

## 2019-10-20 NOTE — Telephone Encounter (Signed)
Patient returned call and stated that she is still having headaches and tingling and numbness in her arm that she received the injection. I advised to the patient that you would give her a call out of courtesy and that it would not cost her anything since she does not have insurance. Patient verbalized understanding and looks forward to your call. Her best number to be reached is [phone].

## 2019-10-24 NOTE — Telephone Encounter (Signed)
I called the patient and left a voicemail to discuss her reaction. I will try to call her back later.   Malachi Bonds, MD [facility] [place]

## 2019-10-30 ENCOUNTER — Ambulatory Visit: Payer: Medicaid Other | Attending: Internal Medicine

## 2019-12-01 ENCOUNTER — Ambulatory Visit: Payer: Medicaid Other | Admitting: Gastroenterology

## 2019-12-12 ENCOUNTER — Ambulatory Visit: Payer: Medicaid Other | Admitting: Family Medicine

## 2019-12-21 ENCOUNTER — Ambulatory Visit: Payer: Medicaid Other | Admitting: Nurse Practitioner

## 2019-12-26 ENCOUNTER — Encounter: Payer: Self-pay | Admitting: Family Medicine

## 2019-12-26 ENCOUNTER — Other Ambulatory Visit: Payer: Self-pay

## 2019-12-26 ENCOUNTER — Ambulatory Visit (INDEPENDENT_AMBULATORY_CARE_PROVIDER_SITE_OTHER): Payer: Self-pay | Admitting: Family Medicine

## 2019-12-26 VITALS — BP 128/80 | HR 68 | Temp 98.1°F | Resp 16 | Ht 68.5 in | Wt 313.0 lb

## 2019-12-26 DIAGNOSIS — R7982 Elevated C-reactive protein (CRP): Secondary | ICD-10-CM

## 2019-12-26 DIAGNOSIS — D500 Iron deficiency anemia secondary to blood loss (chronic): Secondary | ICD-10-CM

## 2019-12-26 DIAGNOSIS — R519 Headache, unspecified: Secondary | ICD-10-CM

## 2019-12-26 DIAGNOSIS — N938 Other specified abnormal uterine and vaginal bleeding: Secondary | ICD-10-CM

## 2019-12-26 DIAGNOSIS — E559 Vitamin D deficiency, unspecified: Secondary | ICD-10-CM

## 2019-12-26 DIAGNOSIS — M791 Myalgia, unspecified site: Secondary | ICD-10-CM

## 2019-12-26 MED ORDER — TIZANIDINE HCL 4 MG PO TABS: 4.0000 mg | ORAL_TABLET | Freq: Three times a day (TID) | ORAL | 0 refills | Status: DC | PRN

## 2019-12-26 MED ORDER — AMITRIPTYLINE HCL 25 MG PO TABS: 25.0000 mg | ORAL_TABLET | Freq: Every day | ORAL | 1 refills | Status: DC

## 2019-12-26 NOTE — Progress Notes (Signed)
Chief Complaint  Patient presents with  . Foot Pain    feet swell up after 30 minutes of being on them, only able to wear oversized shoes.  . Headache    ongoing, radiates from back/neck pain  . Back Pain    radiates up to neck, thinks it may be triggering headaches  . Abdominal Pain   HPI: [name] is a 39 y.o. female with history of fatty liver, iron deficiency anemia, obesity, bipolar disorder type II, and anxiety here today several complaints, listed above. -Occipital parietal frontal headache, pressure-like pain, severe and daily. No known history of OSA but states that frequently she wakes up feeling like she is choking and coughing. + Fatigue, she does not feel rested when she first gets up. She has not identified exacerbating or alleviating factors.  -Back pain and neck pain since end of 10/2019.  She denies prior history. Generalized arthralgias. She has been evaluated by rheumatologist in 2015. Rheumatologic work-up otherwise negative.  Bilateral plantar/heel pain that is started in 09/2019 and getting worse. Pain is constant, severe. It is worse in the morning when she first gets up. No history of trauma. Negative for numbness or tingling. + Severe burning sensation.  Lower extremity edema, worse after long standing/walking. He seems to be better in the morning and with rest.  She has not tried any OTC medications.  She is allergic to NSAIDs. Tylenol does not help.  -Vitamin D deficiency: Currently she is not on vitamin D supplementation. 11/05/2018 1525 OH vitamin D was low at 12.  -Increased urination, she is concerned about DM2. Negative for dysuria, gross hematuria, decreased urine output, or urgency.  Lab Results  Component Value Date   HGBA1C 5.9 04/12/2019    -Bowel movements from 4-5 times watery stools to 2 times per week and soft/formed stools Negative for melena or blood in the stool. Chronic nausea and vomiting, she follows with  GI.  Abdominal pain right-side. Umbilical sharp pain sometimes. Problem is exacerbated by food intake.  IBS dxed in her early 20's.  Abdominal CT 01/15/19: 1. No acute findings within the abdomen or pelvis 2. Diffuse hepatic steatosis with relative enlargement of the caudate lobe and lateral segment of left lobe of liver. Findings may be seen with early cirrhosis. 3. Right ovary cyst measures 3 cm. This is almost certainly benign,and no specific imaging follow up is recommended according to the  -[facility], this has been going on for a while. + Smoker.  According to patient she was recommended to lose weight. She has difficulty exercising regularly due to chronic pain. She tries to watch what she eats, she is frustrated because she is not able to lose weight.  -Iron deficiency anemia: Currently she is on OTC iron supplementation.  Lab Results  Component Value Date   WBC 8.7 10/06/2019   HGB 8.4 (L) 10/06/2019   HCT 30.8 (L) 10/06/2019   MCV 65.3 (L) 10/06/2019   PLT 463 (H) 10/06/2019   Irregular menses, lasts 1-43 days and q 2-3 months. "Huge" blood clots." She is established with gynecologist, she reported history of thick endometrial lining. LMP: 11/20/2019. No sexually active.  Bipolar disorder anxiety: Currently she is on risperidone 1 mg daily, alprazolam 1 mg 4 times per day, Lamictal 200 mg daily, BuSpar 10 mg twice daily, hydroxyzine 50 mg twice daily.  Review of Systems  Constitutional: Positive for fatigue. Negative for activity change, appetite change and fever.  HENT: Negative for mouth sores,  nosebleeds and sore throat.   Eyes: Positive for photophobia. Negative for redness and visual disturbance.  Respiratory: Negative for cough and wheezing.   Cardiovascular: Negative for chest pain, palpitations and leg swelling.  Gastrointestinal:       Negative for changes in bowel habits.  Musculoskeletal: Positive for arthralgias.  Allergic/Immunologic: Positive for  environmental allergies.  Neurological: Negative for syncope and facial asymmetry.  Psychiatric/Behavioral: Positive for sleep disturbance. Negative for confusion and hallucinations.  Rest see pertinent positives and negatives per HPI.  Current Outpatient Medications on File Prior to Visit  Medication Sig Dispense Refill  . ALPRAZolam (XANAX) 1 MG tablet Take 1 mg by mouth 4 (four) times daily.    . busPIRone (BUSPAR) 10 MG tablet Take 10 mg by mouth 2 (two) times daily. Morning & afternoon    . EPINEPHrine 0.3 mg/0.3 mL IJ SOAJ injection Inject 0.3 mLs (0.3 mg total) into the muscle as needed for anaphylaxis. 1 each 1  . FDGARD 25-20.75 MG CAPS Take 2 capsules by mouth 2 (two) times daily. 30 minutes before lunch & supper    . hydrOXYzine (VISTARIL) 50 MG capsule Take 50 mg by mouth 2 (two) times daily. In the afternoon & at bedtime.    . lamoTRIgine (LAMICTAL) 200 MG tablet Take 200 mg by mouth daily.    Marland Kitchen omeprazole (PRILOSEC) 20 MG capsule Take 20 mg by mouth 2 (two) times daily.    . Probiotic Product (ALIGN) 4 MG CAPS Take 4 mg by mouth daily.    . risperiDONE (RISPERDAL) 1 MG tablet Take 1 mg by mouth daily.     Marland Kitchen rOPINIRole (REQUIP) 1 MG tablet Take 1 mg by mouth at bedtime.     No current facility-administered medications on file prior to visit.   Past Medical History:  Diagnosis Date  . Alcohol dependence (HCC)   . Bipolar disorder (HCC)   . Chronic back pain   . Ectopic pregnancy    June 2013  . Fatty liver- NAFLD vs EtOH or both 03/07/2019  . Iron deficiency anemia due to chronic blood loss 03/07/2019  . Multiple personalities (HCC)   . Ovarian cyst   . Panic attacks   . Persistent vomiting 03/07/2019  . PTSD (post-traumatic stress disorder)   . Seizure disorder (HCC)    Allergies  Allergen Reactions  . Rocephin [Ceftriaxone Sodium In Dextrose] Anaphylaxis    Social History   Socioeconomic History  . Marital status: Single    Spouse name: Not on file  . Number  of children: Not on file  . Years of education: Not on file  . Highest education level: Not on file  Occupational History  . Not on file  Tobacco Use  . Smoking status: Current Every Day Smoker    Packs/day: 0.50    Years: 23.00    Pack years: 11.50    Types: Cigarettes  . Smokeless tobacco: Never Used  Vaping Use  . Vaping Use: Former  Substance and Sexual Activity  . Alcohol use: Not Currently    Alcohol/week: 18.0 standard drinks    Types: 10 Cans of beer, 8 Shots of liquor per week    Comment: stopped 2 weeks ago  . Drug use: Not Currently  . Sexual activity: Yes    Birth control/protection: None  Other Topics Concern  . Not on file  Social History Narrative  . Not on file   Social Determinants of Health   Financial Resource Strain:   . Difficulty  of Paying Living Expenses:   Food Insecurity:   . Worried About Programme researcher, broadcasting/film/video in the Last Year:   . Barista in the Last Year:   Transportation Needs:   . Freight forwarder (Medical):   Marland Kitchen Lack of Transportation (Non-Medical):   Physical Activity:   . Days of Exercise per Week:   . Minutes of Exercise per Session:   Stress:   . Feeling of Stress :   Social Connections:   . Frequency of Communication with Friends and Family:   . Frequency of Social Gatherings with Friends and Family:   . Attends Religious Services:   . Active Member of Clubs or Organizations:   . Attends Banker Meetings:   Marland Kitchen Marital Status:     Vitals:   12/26/19 1511  BP: 128/80  Pulse: 68  Resp: 16  Temp: 98.1 F (36.7 C)  SpO2: 100%   Body mass index is 46.9 kg/m.  Physical Exam Vitals and nursing note reviewed.  Constitutional:      General: She is not in acute distress.    Appearance: She is well-developed. She is obese.  HENT:     Head: Normocephalic and atraumatic.     Mouth/Throat:     Mouth: Mucous membranes are moist.     Pharynx: Oropharynx is clear.  Eyes:     Conjunctiva/sclera:  Conjunctivae normal.     Pupils: Pupils are equal, round, and reactive to light.  Cardiovascular:     Rate and Rhythm: Normal rate and regular rhythm.     Pulses:          Dorsalis pedis pulses are 2+ on the right side and 2+ on the left side.     Heart sounds: No murmur heard.   Pulmonary:     Effort: Pulmonary effort is normal. No respiratory distress.     Breath sounds: Normal breath sounds.  Abdominal:     General: There is no distension.     Palpations: Abdomen is soft. There is no hepatomegaly or mass.     Tenderness: There is generalized abdominal tenderness. There is no guarding or rebound.  Musculoskeletal:     Cervical back: No bony tenderness. Normal range of motion.       Back:     Comments: Tender trigger points in upper, mid, lower back.  Upper and lower extremities and chest wall. No signs of synovitis. R>L foot:Tenderness upon palpation of heel mainly at the medial insertion of plantar fascia into calcaneous. Also mild pain with palpation along planta fascia towards forefoot. Left foot bunion  Dorsal flexion of first MTP elicits severe pain.  No edema or erythema appreciated on area. Antalgic gait.  Lymphadenopathy:     Cervical: No cervical adenopathy.  Skin:    General: Skin is warm.     Findings: No erythema or rash.  Neurological:     Mental Status: She is alert and oriented to person, place, and time.     Cranial Nerves: No cranial nerve deficit.     Gait: Gait normal.  Psychiatric:        Mood and Affect: Mood is anxious.     Comments: Good eye contact.   ASSESSMENT AND PLAN:  [name] was seen today for foot pain, headache, back pain and abdominal pain.  Diagnoses and all orders for this visit: Orders Placed This Encounter  Procedures  . CBC with Differential/Platelet  . VITAMIN D 25 Hydroxy (Vit-D Deficiency, Fractures)  .  Sedimentation rate  . Iron  . C-reactive protein   Lab Results  Component Value Date   CRP 21.3 (H) 12/26/2019    Lab Results  Component Value Date   ESRSEDRATE 17 12/26/2019   Lab Results  Component Value Date   WBC 10.3 12/26/2019   HGB 9.2 (L) 12/26/2019   HCT 33.9 (L) 12/26/2019   MCV 64.1 (L) 12/26/2019   PLT 439 (H) 12/26/2019     Myalgia + Tender trigger points, ? Fibromyalgia. She has had rheuma work up a few years ago and negative. Amitriptyline and a better sleep may help. Further recommendations according to lab results. -     amitriptyline (ELAVIL) 25 MG tablet; Take 1 tablet (25 mg total) by mouth at bedtime. -     tiZANidine (ZANAFLEX) 4 MG tablet; Take 1 tablet (4 mg total) by mouth every 8 (eight) hours as needed for muscle spasms.  Headache, unspecified headache type ?Tension headache. Other possible etiologies discussed.  She agrees with trying Amitriptyline 25 mg at bedtime, recommend starting with 1/2 tab and increase to whole one if well tolerated.  OSA also needs to be considered. Sleep study will be arranged. Wt loss strongly recommended.  -     amitriptyline (ELAVIL) 25 MG tablet; Take 1 tablet (25 mg total) by mouth at bedtime.  Iron deficiency anemia due to chronic blood loss No changes in iron supplementation dose. Recommend discussing treatment options for DUB. Other possible causes discussed, ? inflammatory GI process, rheumatologies disorders. Instructed about warning signs.  DUB (dysfunctional uterine bleeding) Recommend arranging follow-up appointment with gynecologist.  Obesity, morbid (HCC) We discussed benefits of weight loss as well as health complications associated with weight gain. Encouraged to be consistent with following a healthful diet. Low impact exercise recommended.  Vitamin D deficiency, unspecified Further recommendation will be given according to 25 OH vitamin D results.    Return in about 6 weeks (around 02/06/2020).   Zhuri Krass G. [place], MD  [facility]. [place] office.  A few things to remember from  today's visit:  If you need refills please call your pharmacy. Do not use My Chart to request refills or for acute issues that need immediate attention.    Please be sure medication list is accurate. If a new problem present, please set up appointment sooner than planned today.  Sleep study will be arranged.  Good sleep hygiene. Amitriptyline started today, start 1/2 tab x 2 weeks and increase to whole tab in 2 weeks. 15 min of daily walking.  Podiatrist evaluation.  Get online care: [email or web address] Address: [address] [place], [place], [place] [address] Hours:  Closes soon ? 5PM ? [place] Wed Phone: [phone] Appointments: [email or web address] Night foot split for plantar fascitis. Stretching exercises with a ball.

## 2019-12-26 NOTE — Patient Instructions (Signed)
A few things to remember from today's visit:   Myalgia - Plan: VITAMIN D 25 Hydroxy (Vit-D Deficiency, Fractures), amitriptyline (ELAVIL) 25 MG tablet  Headache, unspecified headache type - Plan: CBC with Differential/Platelet, C-reactive protein, Sedimentation rate, amitriptyline (ELAVIL) 25 MG tablet  Iron deficiency anemia due to chronic blood loss - Plan: Iron, Sedimentation rate  If you need refills please call your pharmacy. Do not use My Chart to request refills or for acute issues that need immediate attention.    Please be sure medication list is accurate. If a new problem present, please set up appointment sooner than planned today.  Sleep study will be arranged.  Good sleep hygiene. Amitriptyline started today, start 1/2 tab x 2 weeks and increase to whole tab in 2 weeks. 15 min of daily walking.  Podiatrist evaluation.  Get online care: [email or web address] Address: [address], [place], [place] [address] Hours:  Closes soon ? 5PM ? [facility] 8AM Wed Phone: [phone] Appointments: [email or web address] Night foot split for plantar fascitis. Stretching exercises with a ball.  Plantar Fasciitis  Plantar fasciitis is a painful foot condition that affects the heel. It occurs when the band of tissue that connects the toes to the heel bone (plantar fascia) becomes irritated. This can happen as the result of exercising too much or doing other repetitive activities (overuse injury). The pain from plantar fasciitis can range from mild irritation to severe pain that makes it difficult to walk or move. The pain is usually worse in the morning after sleeping, or after sitting or lying down for a while. Pain may also be worse after long periods of walking or standing. What are the causes? This condition may be caused by:  Standing for long periods of time.  Wearing shoes that do not have good arch support.  Doing activities that put stress on joints (high-impact activities), including  running, aerobics, and ballet.  Being overweight.  An abnormal way of walking (gait).  Tight muscles in the back of your lower leg (calf).  High arches in your feet.  Starting a new athletic activity. What are the signs or symptoms? The main symptom of this condition is heel pain. Pain may:  Be worse with first steps after a time of rest, especially in the morning after sleeping or after you have been sitting or lying down for a while.  Be worse after long periods of standing still.  Decrease after 30-45 minutes of activity, such as gentle walking. How is this diagnosed? This condition may be diagnosed based on your medical history and your symptoms. Your health care provider may ask questions about your activity level. Your health care provider will do a physical exam to check for:  A tender area on the bottom of your foot.  A high arch in your foot.  Pain when you move your foot.  Difficulty moving your foot. You may have imaging tests to confirm the diagnosis, such as:  X-rays.  Ultrasound.  MRI. How is this treated? Treatment for plantar fasciitis depends on how severe your condition is. Treatment may include:  Rest, ice, applying pressure (compression), and raising the affected foot (elevation). This may be called RICE therapy. Your health care provider may recommend RICE therapy along with over-the-counter pain medicines to manage your pain.  Exercises to stretch your calves and your plantar fascia.  A splint that holds your foot in a stretched, upward position while you sleep (night splint).  Physical therapy to relieve symptoms and prevent problems  in the future.  Injections of steroid medicine (cortisone) to relieve pain and inflammation.  Stimulating your plantar fascia with electrical impulses (extracorporeal shock wave therapy). This is usually the last treatment option before surgery.  Surgery, if other treatments have not worked after 12  months. Follow these instructions at home:  Managing pain, stiffness, and swelling  If directed, put ice on the painful area: ? Put ice in a plastic bag, or use a frozen bottle of water. ? Place a towel between your skin and the bag or bottle. ? Roll the bottom of your foot over the bag or bottle. ? Do this for 20 minutes, 2-3 times a day.  Wear athletic shoes that have air-sole or gel-sole cushions, or try wearing soft shoe inserts that are designed for plantar fasciitis.  Raise (elevate) your foot above the level of your heart while you are sitting or lying down. Activity  Avoid activities that cause pain. Ask your health care provider what activities are safe for you.  Do physical therapy exercises and stretches as told by your health care provider.  Try activities and forms of exercise that are easier on your joints (low-impact). Examples include swimming, water aerobics, and biking. General instructions  Take over-the-counter and prescription medicines only as told by your health care provider.  Wear a night splint while sleeping, if told by your health care provider. Loosen the splint if your toes tingle, become numb, or turn cold and blue.  Maintain a healthy weight, or work with your health care provider to lose weight as needed.  Keep all follow-up visits as told by your health care provider. This is important. Contact a health care provider if you:  Have symptoms that do not go away after caring for yourself at home.  Have pain that gets worse.  Have pain that affects your ability to move or do your daily activities. Summary  Plantar fasciitis is a painful foot condition that affects the heel. It occurs when the band of tissue that connects the toes to the heel bone (plantar fascia) becomes irritated.  The main symptom of this condition is heel pain that may be worse after exercising too much or standing still for a long time.  Treatment varies, but it usually  starts with rest, ice, compression, and elevation (RICE therapy) and over-the-counter medicines to manage pain. This information is not intended to replace advice given to you by your health care provider. Make sure you discuss any questions you have with your health care provider. Document Revised: 05/14/2017 Document Reviewed: 03/29/2017 Elsevier Patient Education  2020 [employer]vier Inc[employer]  Myofascial Pain Syndrome and Fibromyalgia Myofascial pain syndrome and fibromyalgia are both pain disorders. This pain may be felt mainly in your muscles.  Myofascial pain syndrome: ? Always has tender points in the muscle that will cause pain when pressed (trigger points). The pain may come and go. ? Usually affects your neck, upper back, and shoulder areas. The pain often radiates into your arms and hands.  Fibromyalgia: ? Has muscle pains and tenderness that come and go. ? Is often associated with fatigue and sleep problems. ? Has trigger points. ? Tends to be long-lasting (chronic), but is not life-threatening. Fibromyalgia and myofascial pain syndrome are not the same. However, they often occur together. If you have both conditions, each can make the other worse. Both are common and can cause enough pain and fatigue to make day-to-day activities difficult. Both can be hard to diagnose because their symptoms are  common in many other conditions. What are the causes? The exact causes of these conditions are not known. What increases the risk? You are more likely to develop this condition if:  You have a family history of the condition.  You have certain triggers, such as: ? Spine disorders. ? An injury (trauma) or other physical stressors. ? Being under a lot of stress. ? Medical conditions such as osteoarthritis, rheumatoid arthritis, or lupus. What are the signs or symptoms? Fibromyalgia The main symptom of fibromyalgia is widespread pain and tenderness in your muscles. Pain is sometimes described  as stabbing, shooting, or burning. You may also have:  Tingling or numbness.  Sleep problems and fatigue.  Problems with attention and concentration (fibro fog). Other symptoms may include:  Bowel and bladder problems.  Headaches.  Visual problems.  Problems with odors and noises.  Depression or mood changes.  Painful menstrual periods (dysmenorrhea).  Dry skin or eyes. These symptoms can vary over time. Myofascial pain syndrome Symptoms of myofascial pain syndrome include:  Tight, ropy bands of muscle.  Uncomfortable sensations in muscle areas. These may include aching, cramping, burning, numbness, tingling, and weakness.  Difficulty moving certain parts of the body freely (poor range of motion). How is this diagnosed? This condition may be diagnosed by your symptoms and medical history. You will also have a physical exam. In general:  Fibromyalgia is diagnosed if you have pain, fatigue, and other symptoms for more than 3 months, and symptoms cannot be explained by another condition.  Myofascial pain syndrome is diagnosed if you have trigger points in your muscles, and those trigger points are tender and cause pain elsewhere in your body (referred pain). How is this treated? Treatment for these conditions depends on the type that you have.  For fibromyalgia: ? Pain medicines, such as NSAIDs. ? Medicines for treating depression. ? Medicines for treating seizures. ? Medicines that relax the muscles.  For myofascial pain: ? Pain medicines, such as NSAIDs. ? Cooling and stretching of muscles. ? Trigger point injections. ? Sound wave (ultrasound) treatments to stimulate muscles. Treating these conditions often requires a team of health care providers. These may include:  Your primary care provider.  Physical therapist.  Complementary health care providers, such as massage therapists or acupuncturists.  Psychiatrist for cognitive behavioral therapy. Follow  these instructions at home: Medicines  Take over-the-counter and prescription medicines only as told by your health care provider.  Do not drive or use heavy machinery while taking prescription pain medicine.  If you are taking prescription pain medicine, take actions to prevent or treat constipation. Your health care provider may recommend that you: ? Drink enough fluid to keep your urine pale yellow. ? Eat foods that are high in fiber, such as fresh fruits and vegetables, whole grains, and beans. ? Limit foods that are high in fat and processed sugars, such as fried or sweet foods. ? Take an over-the-counter or prescription medicine for constipation. Lifestyle   Exercise as directed by your health care provider or physical therapist.  Practice relaxation techniques to control your stress. You may want to try: ? Biofeedback. ? Visual imagery. ? Hypnosis. ? Muscle relaxation. ? Yoga. ? Meditation.  Maintain a healthy lifestyle. This includes eating a healthy diet and getting enough sleep.  Do not use any products that contain nicotine or tobacco, such as cigarettes and e-cigarettes. If you need help quitting, ask your health care provider. General instructions  Talk to your health care provider about complementary  treatments, such as acupuncture or massage.  Consider joining a support group with others who are diagnosed with this condition.  Do not do activities that stress or strain your muscles. This includes repetitive motions and heavy lifting.  Keep all follow-up visits as told by your health care provider. This is important. Where to find more information  National Fibromyalgia Association: www.fmaware.org  Arthritis Foundation: www.arthritis.org  American Chronic Pain Association: www.theacpa.org Contact a health care provider if:  You have new symptoms.  Your symptoms get worse or your pain is severe.  You have side effects from your medicines.  You have  trouble sleeping.  Your condition is causing depression or anxiety. Summary  Myofascial pain syndrome and fibromyalgia are pain disorders.  Myofascial pain syndrome has tender points in the muscle that will cause pain when pressed (trigger points). Fibromyalgia also has muscle pains and tenderness that come and go, but this condition is often associated with fatigue and sleep disturbances.  Fibromyalgia and myofascial pain syndrome are not the same but often occur together, causing pain and fatigue that make day-to-day activities difficult.  Treatment for fibromyalgia includes taking medicines to relax the muscles and medicines for pain, depression, or seizures. Treatment for myofascial pain syndrome includes taking medicines for pain, cooling and stretching of muscles, and injecting medicines into trigger points.  Follow your health care provider's instructions for taking medicines and maintaining a healthy lifestyle. This information is not intended to replace advice given to you by your health care provider. Make sure you discuss any questions you have with your health care provider. Document Revised: 09/23/2018 Document Reviewed: 06/16/2017 Elsevier Patient Education  2020 [employer].

## 2019-12-26 NOTE — Assessment & Plan Note (Signed)
Recommend arranging follow-up appointment with gynecologist.

## 2019-12-26 NOTE — Assessment & Plan Note (Signed)
We discussed benefits of weight loss as well as health complications associated with weight gain. Encouraged to be consistent with following a healthful diet. Low impact exercise recommended.

## 2019-12-26 NOTE — Assessment & Plan Note (Signed)
Further recommendation will be given according to 25 OH vitamin D results. 

## 2019-12-27 ENCOUNTER — Encounter: Payer: Self-pay | Admitting: Family Medicine

## 2019-12-27 LAB — CBC WITH DIFFERENTIAL/PLATELET
Absolute Monocytes: 597 cells/uL (ref 200–950)
Basophils Absolute: 62 cells/uL (ref 0–200)
Basophils Relative: 0.6 %
Eosinophils Absolute: 165 cells/uL (ref 15–500)
Eosinophils Relative: 1.6 %
HCT: 33.9 % — ABNORMAL LOW (ref 35.0–45.0)
Hemoglobin: 9.2 g/dL — ABNORMAL LOW (ref 11.7–15.5)
Lymphs Abs: 1988 cells/uL (ref 850–3900)
MCH: 17.4 pg — ABNORMAL LOW (ref 27.0–33.0)
MCHC: 27.1 g/dL — ABNORMAL LOW (ref 32.0–36.0)
MCV: 64.1 fL — ABNORMAL LOW (ref 80.0–100.0)
MPV: 9.8 fL (ref 7.5–12.5)
Monocytes Relative: 5.8 %
Neutro Abs: 7488 cells/uL (ref 1500–7800)
Neutrophils Relative %: 72.7 %
Platelets: 439 10*3/uL — ABNORMAL HIGH (ref 140–400)
RBC: 5.29 10*6/uL — ABNORMAL HIGH (ref 3.80–5.10)
RDW: 19.1 % — ABNORMAL HIGH (ref 11.0–15.0)
Total Lymphocyte: 19.3 %
WBC: 10.3 10*3/uL (ref 3.8–10.8)

## 2019-12-27 LAB — SEDIMENTATION RATE: Sed Rate: 17 mm/h (ref 0–20)

## 2019-12-27 LAB — VITAMIN D 25 HYDROXY (VIT D DEFICIENCY, FRACTURES): Vit D, 25-Hydroxy: 14 ng/mL — ABNORMAL LOW (ref 30–100)

## 2019-12-27 LAB — C-REACTIVE PROTEIN: CRP: 21.3 mg/L — ABNORMAL HIGH (ref <8.0)

## 2019-12-27 LAB — IRON: Iron: 13 ug/dL — ABNORMAL LOW (ref 40–190)

## 2019-12-30 MED ORDER — FERROUS SULFATE 325 (65 FE) MG PO TABS: 325.0000 mg | ORAL_TABLET | Freq: Every day | ORAL | 3 refills | Status: DC

## 2019-12-30 MED ORDER — VITAMIN D (ERGOCALCIFEROL) 1.25 MG (50000 UNIT) PO CAPS: 50000.0000 [IU] | ORAL_CAPSULE | ORAL | 0 refills | Status: AC

## 2020-01-02 ENCOUNTER — Other Ambulatory Visit: Payer: Self-pay

## 2020-01-02 ENCOUNTER — Telehealth: Payer: Self-pay | Admitting: Nurse Practitioner

## 2020-01-03 NOTE — Telephone Encounter (Signed)
Spoke with the patient. She has continued to have vomiting since she was seen last fall. Her GES was normal. She has known fatty liver and has been careful with her diet and exercise. When she saw Dr [place] recently, she was encouraged to come for follow up with GI. Patient report vomiting, urgent passage of stool that appears to have undigested food in it. She feels bloated and nauseated. Next available appointment scheduled.

## 2020-01-04 ENCOUNTER — Other Ambulatory Visit: Payer: Medicaid Other

## 2020-01-04 ENCOUNTER — Other Ambulatory Visit: Payer: Self-pay

## 2020-01-04 DIAGNOSIS — R7982 Elevated C-reactive protein (CRP): Secondary | ICD-10-CM

## 2020-01-04 NOTE — Addendum Note (Signed)
Addended by: Lerry Liner on: 01/04/2020 10:49 AM   Modules accepted: Orders

## 2020-01-05 ENCOUNTER — Encounter: Payer: Self-pay | Admitting: Family Medicine

## 2020-01-06 LAB — BASIC METABOLIC PANEL
BUN: 15 mg/dL (ref 7–25)
CO2: 24 mmol/L (ref 20–32)
Calcium: 9.1 mg/dL (ref 8.6–10.2)
Chloride: 105 mmol/L (ref 98–110)
Creat: 0.98 mg/dL (ref 0.50–1.10)
Glucose, Bld: 110 mg/dL — ABNORMAL HIGH (ref 65–99)
Potassium: 5 mmol/L (ref 3.5–5.3)
Sodium: 139 mmol/L (ref 135–146)

## 2020-01-06 LAB — HEPATITIS C ANTIBODY
Hepatitis C Ab: NONREACTIVE
SIGNAL TO CUT-OFF: 0.01 (ref <1.00)

## 2020-01-06 LAB — ANA, IFA COMPREHENSIVE PANEL
Anti Nuclear Antibody (ANA): NEGATIVE
ENA SM Ab Ser-aCnc: <1 AI
SM/RNP: <1 AI
SSA (Ro) (ENA) Antibody, IgG: <1 AI
SSB (La) (ENA) Antibody, IgG: <1 AI
Scleroderma (Scl-70) (ENA) Antibody, IgG: <1 AI
ds DNA Ab: <1 IU/mL

## 2020-01-06 LAB — EXTRA LAV TOP TUBE

## 2020-01-06 LAB — HEPATIC FUNCTION PANEL
AG Ratio: 1.5 (calc) (ref 1.0–2.5)
ALT: 27 U/L (ref 6–29)
AST: 44 U/L — ABNORMAL HIGH (ref 10–30)
Albumin: 4.2 g/dL (ref 3.6–5.1)
Alkaline phosphatase (APISO): 105 U/L (ref 31–125)
Bilirubin, Direct: 0.1 mg/dL (ref 0.0–0.2)
Globulin: 2.8 g/dL (calc) (ref 1.9–3.7)
Indirect Bilirubin: 0.2 mg/dL (calc) (ref 0.2–1.2)
Total Bilirubin: 0.3 mg/dL (ref 0.2–1.2)
Total Protein: 7 g/dL (ref 6.1–8.1)

## 2020-01-06 LAB — RHEUMATOID FACTOR: Rheumatoid fact SerPl-aCnc: 35 IU/mL — ABNORMAL HIGH (ref <14)

## 2020-01-06 LAB — TSH: TSH: 2.61 mIU/L

## 2020-01-06 LAB — CYCLIC CITRUL PEPTIDE ANTIBODY, IGG: Cyclic Citrullin Peptide Ab: <16 UNITS

## 2020-01-08 ENCOUNTER — Other Ambulatory Visit: Payer: Self-pay | Admitting: Family Medicine

## 2020-01-08 DIAGNOSIS — R7982 Elevated C-reactive protein (CRP): Secondary | ICD-10-CM

## 2020-01-08 DIAGNOSIS — M2550 Pain in unspecified joint: Secondary | ICD-10-CM

## 2020-01-18 ENCOUNTER — Encounter: Payer: Self-pay | Admitting: Nurse Practitioner

## 2020-01-18 ENCOUNTER — Ambulatory Visit (INDEPENDENT_AMBULATORY_CARE_PROVIDER_SITE_OTHER): Payer: Self-pay | Admitting: Nurse Practitioner

## 2020-01-18 VITALS — BP 120/78 | HR 115 | Ht 68.5 in | Wt 313.0 lb

## 2020-01-18 DIAGNOSIS — K760 Fatty (change of) liver, not elsewhere classified: Secondary | ICD-10-CM

## 2020-01-18 DIAGNOSIS — G8929 Other chronic pain: Secondary | ICD-10-CM

## 2020-01-18 DIAGNOSIS — R194 Change in bowel habit: Secondary | ICD-10-CM

## 2020-01-18 DIAGNOSIS — R519 Headache, unspecified: Secondary | ICD-10-CM

## 2020-01-18 DIAGNOSIS — R112 Nausea with vomiting, unspecified: Secondary | ICD-10-CM

## 2020-01-18 MED ORDER — DIPHENOXYLATE-ATROPINE 2.5-0.025 MG PO TABS
1.0000 | ORAL_TABLET | Freq: Every day | ORAL | 2 refills | Status: DC
Start: 2020-01-18 — End: 2021-12-18

## 2020-01-18 NOTE — Progress Notes (Signed)
IMPRESSION and PLAN:    39 yo female with bipolar disorder, anxiety, PTSD, obesity, hx of alcoholism, chronic nausea and vomiting, history of gastritis / PUD  in 2018, Vitamin D deficiency,      # chronic nausea, vomiting --No GI etiology found to date ( CTscan, EGD last year, gastric emptying study) --Offered anti-emetics but she says they don't help --Was recently started on Elavil ( for pain) but it can help nausea / vomiting. Could consider dosage increase to 50 mg in future. She is followed by Psychiatry though I believe PCP is one who started the Elavil. Mirtazapine can also be helpful but may cause weight gain plus she is already on several psychiatric medications and I wouldn't want to mix those meds.  --She has been having migraines / tingling sensation in head over last 6 months. Though brain lesion unlikely will obtain MRI brain with and without to be sure brain lesion not causing nausea / vomiting.  --No further GI evaluation of nausea / vomiting planned at this point.   # Increased frequency of bowel movements  --Typically has ~ 3 soft BMs plus one liquid BMs a day. Doesn't really qualify for diarrhea.  --No benefit from imodium in past. Will try Lomotil. Start conservatively with just one tablet a day so as not to cause constipation.  -Can stop FD guard and Align- not helpful and been on both for a year.    # Chronic microcytic anemia, despite iron --Was having abnormal with prolonged menses last year. Over the last year bleeding has been heavy but cycles less frequent.  --taking ferrous sulfate daily --Celiac studies negative.  --Colonoscopy 3 years ago for hematochezia with findings of hemorrhoids, diverticulosis and ulcer in descending colon (benign mucosa on biopsy).   #  Chronic elevation of ALT. Steatosis, ? Early cirrhosis on CT scan last year.  --Alcoholism in remission --No portal hypertension on EGD last year --INR and albumin less than a year ago were both  normal. No thrombocytopenia, platelets actually elevated at 439  # Myalgias / elevated CRP at 21 --to see Rheumatology  HPI:    Primary GI: Stan Head, MD   Chief complaint : nausea / vomiitng  This patient is a 39 yo female since a few times last year but has been a no show for multiple appointments since. Patient says she has been scare to go out due to COVID. She has chronic nausea and vomiting. For this she had an unrevealing CT scan Aug 2020, a normal EGD by [place] in September 2020 and a normal gastric emptying study October 2020. CT scan did raise concern for early cirrhosis.   Complains of ongoing nausea / vomiting / diarrhea. Has 3-4 BMs a day most of which are yellow and somewhat formed. Generally one of the daily BMs is loose. She tried imodium, two tablets a day for a week without any improvement. Has seen blood in stool but only on two occasions over the last two years.  Vomiting whole pieces of food at least once a day. Has been on FD guard for a year without any improvement. Been on Align for a year as well. Vomiting is independent of what or how much she eats. Over the last 6 months she has been trying to eliminate sweets and fatty foods to lose weight. Her weight continues to fluctuate. . She has been migraine headaches for the past 6 months. Says her scalp has been tingling. Inside of head  feels like what static on a TV might feel like. No dizziness / balance problems. No history of inner ear problems.    She has been having swelling or hand and feet. Having back, knee and shoulder pain. CRP elevated. PCP referring her to Rheumatology.    Got first dose of COVID vaccine and had anaphylactic reactiion. Had hard time breathing, tongue burning, got tachycardic. Got Epipen. Didn't get second shot.     Data reviewed:  12/26/2019 WBC 10.3, hemoglobin 9.2, MCV 64, platelets 439 AST 44, ALT 27, total bilirubin 0.3  PREVIOUS ENDOSCOPIC EVALUATIONS / GI studies: Colonoscopy May  2018 --diverticulosis --ulcer in descending colon --hemorrhoids.   EGD Sept 2020 --normal   Review of systems:     No chest pain, no SOB, no fevers, no urinary sx   Past Medical History:  Diagnosis Date  . Alcohol dependence (HCC)   . Bipolar disorder (HCC)   . Chronic back pain   . Ectopic pregnancy    June 2013  . Fatty liver- NAFLD vs EtOH or both 03/07/2019  . Iron deficiency anemia due to chronic blood loss 03/07/2019  . Multiple personalities (HCC)   . Ovarian cyst   . Panic attacks   . Persistent vomiting 03/07/2019  . PTSD (post-traumatic stress disorder)   . Seizure disorder [facility])     Patient's surgical history, family medical history, social history, medications and allergies were all reviewed in Epic   Creatinine clearance cannot be calculated (Unknown ideal weight.)  Current Outpatient Medications  Medication Sig Dispense Refill  . ALPRAZolam (XANAX) 1 MG tablet Take 1 mg by mouth 4 (four) times daily.    Marland Kitchen amitriptyline (ELAVIL) 25 MG tablet Take 1 tablet (25 mg total) by mouth at bedtime. 30 tablet 1  . busPIRone (BUSPAR) 10 MG tablet Take 10 mg by mouth 2 (two) times daily. Morning & afternoon    . EPINEPHrine 0.3 mg/0.3 mL IJ SOAJ injection Inject 0.3 mLs (0.3 mg total) into the muscle as needed for anaphylaxis. 1 each 1  . FDGARD 25-20.75 MG CAPS Take 2 capsules by mouth 2 (two) times daily. 30 minutes before lunch & supper    . ferrous sulfate 325 (65 FE) MG tablet Take 1 tablet (325 mg total) by mouth daily. 90 tablet 3  . hydrOXYzine (VISTARIL) 50 MG capsule Take 50 mg by mouth 2 (two) times daily. In the afternoon & at bedtime.    . lamoTRIgine (LAMICTAL) 200 MG tablet Take 200 mg by mouth daily.    Marland Kitchen omeprazole (PRILOSEC) 20 MG capsule Take 20 mg by mouth 2 (two) times daily.    . Probiotic Product (ALIGN) 4 MG CAPS Take 4 mg by mouth daily.    . risperiDONE (RISPERDAL) 1 MG tablet Take 1 mg by mouth daily.     Marland Kitchen rOPINIRole (REQUIP) 1 MG tablet Take 1  mg by mouth at bedtime.    Marland Kitchen tiZANidine (ZANAFLEX) 4 MG tablet Take 1 tablet (4 mg total) by mouth every 8 (eight) hours as needed for muscle spasms. 30 tablet 0  . Vitamin D, Ergocalciferol, (DRISDOL) 1.25 MG (50000 UNIT) CAPS capsule Take 1 capsule (50,000 Units total) by mouth every 7 (seven) days for 8 doses. 8 capsule 0   No current facility-administered medications for this visit.    There were no vitals filed for this visit.  Physical Exam:     There were no vitals taken for this visit.  GENERAL:  Pleasant female in NAD PSYCH: :  Cooperative, normal affect CARDIAC:  RRR PULM: Normal respiratory effort, lungs CTA bilaterally, no wheezing ABDOMEN:  Nondistended, soft, nontender. No obvious masses, no hepatomegaly,  normal bowel sounds SKIN:  turgor, no lesions seen Musculoskeletal:  Normal muscle tone, normal strength NEURO: Alert and oriented x 3, no focal neurologic deficits   Willette Cluster , NP 01/18/2020, 1:35 PM

## 2020-01-18 NOTE — Patient Instructions (Addendum)
You have been scheduled for a MRI/Brain on 01/27/2020 at 2:30pm at  [facility], since its the weekend you have to check in through the Emergency Room and radiology will call you back     STOP FD Delene Ruffini and Align  We will send Lomotil to your pharmacy   If you are age 40 or older, your body mass index should be between 23-30. Your Body mass index is 46.9 kg/m. If this is out of the aforementioned range listed, please consider follow up with your Primary Care Provider.  If you are age 29 or younger, your body mass index should be between 19-25. Your Body mass index is 46.9 kg/m. If this is out of the aformentioned range listed, please consider follow up with your Primary Care Provider.    Due to recent changes in healthcare laws, you may see the results of your imaging and laboratory studies on MyChart before your provider has had a chance to review them.  We understand that in some cases there may be results that are confusing or concerning to you. Not all laboratory results come back in the same time frame and the provider may be waiting for multiple results in order to interpret others.  Please give [place] 48 hours in order for your provider to thoroughly review all the results before contacting the office for clarification of your results.   I appreciate the  opportunity to care for you  Thank You   Midge Minium

## 2020-01-22 NOTE — Patient Instructions (Addendum)
A few things to remember from today's visit: History and examination today suggest fibromyalgia. Chronic disease that affects pain perception.  Rheumatologic evaluation was recommend because some labs were abnormal, so I still would like for you to arrange appt.  Low impact exercise. Cymbalta 30 mg added today, please let your psychiatrist know.  In 3-4 weeks we could increase dose of Cymbalta if well tolerated.  If you need refills please call your pharmacy. Do not use My Chart to request refills or for acute issues that need immediate attention.    Please be sure medication list is accurate. If a new problem present, please set up appointment sooner than planned today.   SENT TO WORK-QUE - TO SCHEDULE  Dr. Pollyann Savoy, MD Rheumatologist in [facility] Address: [address], [place], [place] [address] Phone: [phone]       If we have ordered labs or studies at this visit, it can take up to 1-2 weeks for results and processing. IF results require follow up or explanation, we will call you with instructions. Clinically stable results will be released to your [facility]. If you have not heard from [place] or cannot find your results in [facility] in 2 weeks please contact our office at [phone].  If you are not yet signed up for [facility], please consider signing up.

## 2020-01-23 ENCOUNTER — Other Ambulatory Visit: Payer: Self-pay

## 2020-01-23 ENCOUNTER — Encounter: Payer: Self-pay | Admitting: Family Medicine

## 2020-01-23 ENCOUNTER — Ambulatory Visit (INDEPENDENT_AMBULATORY_CARE_PROVIDER_SITE_OTHER): Payer: Self-pay | Admitting: Family Medicine

## 2020-01-23 VITALS — BP 138/80 | HR 96 | Temp 98.4°F | Resp 16 | Ht 68.5 in | Wt 313.0 lb

## 2020-01-23 DIAGNOSIS — F3181 Bipolar II disorder: Secondary | ICD-10-CM

## 2020-01-23 DIAGNOSIS — M2550 Pain in unspecified joint: Secondary | ICD-10-CM

## 2020-01-23 DIAGNOSIS — M797 Fibromyalgia: Secondary | ICD-10-CM

## 2020-01-23 MED ORDER — DULOXETINE HCL 30 MG PO CPEP: 30.0000 mg | ORAL_CAPSULE | Freq: Every day | ORAL | 1 refills | Status: DC

## 2020-01-23 NOTE — Progress Notes (Signed)
HPI: [name] is a 39 y.o. female, who is here today for follow up.   She was last seen on 12/26/19. GI for abdominal pain. Brain MRI pending.  Generalized myalgias and arthralgias. + Trigger points. Recommended Amitriptyline.  Lab Results  Component Value Date   CRP 21.3 (H) 12/26/2019   ANA negative. RF elevated. CCP in normal range.  Lab Results  Component Value Date   ESRSEDRATE 17 12/26/2019   She was referred to rheuma, service was declined because type of insurance.  Amitriptyline caused insomnia, stopped 2 days ago. She is following with psychiatrist monthly.  Since her last visit ,she has followed with GI. Nausea,vomiting,and diarrhea. Negative work-up.  Review of Systems  Constitutional: Positive for fatigue. Negative for activity change, appetite change and fever.  HENT: Negative for mouth sores, nosebleeds and sore throat.   Eyes: Negative for redness and visual disturbance.  Respiratory: Negative for cough, shortness of breath and wheezing.   Cardiovascular: Negative for chest pain, palpitations and leg swelling.  Gastrointestinal:       Negative for changes in bowel habits.  Genitourinary: Negative for decreased urine volume and hematuria.  Musculoskeletal: Positive for arthralgias and myalgias. Negative for gait problem.  Neurological: Negative for syncope and weakness.  Psychiatric/Behavioral: Negative for confusion and hallucinations. The patient is nervous/anxious.   Rest of ROS, see pertinent positives sand negatives in HPI  Current Outpatient Medications on File Prior to Visit  Medication Sig Dispense Refill   ALPRAZolam (XANAX) 1 MG tablet Take 1 mg by mouth 4 (four) times daily.     busPIRone (BUSPAR) 10 MG tablet Take 10 mg by mouth 2 (two) times daily. Morning & afternoon     diphenoxylate-atropine (LOMOTIL) 2.5-0.025 MG tablet Take 1 tablet by mouth daily. 30 tablet 2   EPINEPHrine 0.3 mg/0.3 mL IJ SOAJ injection Inject 0.3  mLs (0.3 mg total) into the muscle as needed for anaphylaxis. 1 each 1   ferrous sulfate 325 (65 FE) MG tablet Take 1 tablet (325 mg total) by mouth daily. 90 tablet 3   hydrOXYzine (VISTARIL) 50 MG capsule Take 50 mg by mouth 2 (two) times daily. In the afternoon & at bedtime.     lamoTRIgine (LAMICTAL) 200 MG tablet Take 200 mg by mouth daily.     omeprazole (PRILOSEC) 20 MG capsule Take 20 mg by mouth 2 (two) times daily.     Probiotic Product (ALIGN) 4 MG CAPS Take 4 mg by mouth daily.     risperiDONE (RISPERDAL) 1 MG tablet Take 1 mg by mouth daily.      rOPINIRole (REQUIP) 1 MG tablet Take 1 mg by mouth at bedtime.     Vitamin D, Ergocalciferol, (DRISDOL) 1.25 MG (50000 UNIT) CAPS capsule Take 1 capsule (50,000 Units total) by mouth every 7 (seven) days for 8 doses. 8 capsule 0   No current facility-administered medications on file prior to visit.   Past Medical History:  Diagnosis Date   Alcohol dependence (HCC)    Bipolar disorder (HCC)    Chronic back pain    Ectopic pregnancy    June 2013   Fatty liver- NAFLD vs EtOH or both 03/07/2019   Iron deficiency anemia due to chronic blood loss 03/07/2019   Multiple personalities (HCC)    Ovarian cyst    Panic attacks    Persistent vomiting 03/07/2019   PTSD (post-traumatic stress disorder)    Seizure disorder (HCC)    Allergies  Allergen Reactions  Rocephin [Ceftriaxone Sodium In Dextrose] Anaphylaxis    Social History   Socioeconomic History   Marital status: Single    Spouse name: Not on file   Number of children: Not on file   Years of education: Not on file   Highest education level: Not on file  Occupational History   Occupation: unemployed  Tobacco Use   Smoking status: Current Every Day Smoker    Packs/day: 0.50    Years: 23.00    Pack years: 11.50    Types: Cigarettes   Smokeless tobacco: Never Used   Tobacco comment: tobacco info given  Vaping Use   Vaping Use: Former    Substance and Sexual Activity   Alcohol use: Not Currently    Alcohol/week: 18.0 standard drinks    Types: 10 Cans of beer, 8 Shots of liquor per week    Comment: stopped 2 weeks ago   Drug use: Not Currently   Sexual activity: Not Currently    Birth control/protection: None  Other Topics Concern   Not on file  Social History Narrative   Not on file   Social Determinants of Health   Financial Resource Strain:    Difficulty of Paying Living Expenses:   Food Insecurity:    Worried About Programme researcher, broadcasting/film/video in the Last Year:    Barista in the Last Year:   Transportation Needs:    Freight forwarder (Medical):    Lack of Transportation (Non-Medical):   Physical Activity:    Days of Exercise per Week:    Minutes of Exercise per Session:   Stress:    Feeling of Stress :   Social Connections:    Frequency of Communication with Friends and Family:    Frequency of Social Gatherings with Friends and Family:    Attends Religious Services:    Active Member of Clubs or Organizations:    Attends Banker Meetings:    Marital Status:     Vitals:   01/23/20 1520  BP: 138/80  Pulse: 96  Resp: 16  Temp: 98.4 F (36.9 C)   Body mass index is 46.9 kg/m.  Physical Exam Vitals and nursing note reviewed.  Constitutional:      General: She is not in acute distress.    Appearance: She is well-developed.  HENT:     Head: Normocephalic and atraumatic.  Eyes:     Conjunctiva/sclera: Conjunctivae normal.     Pupils: Pupils are equal, round, and reactive to light.  Cardiovascular:     Rate and Rhythm: Normal rate and regular rhythm.     Heart sounds: No murmur heard.   Pulmonary:     Effort: Pulmonary effort is normal. No respiratory distress.     Breath sounds: Normal breath sounds.  Abdominal:     Palpations: Abdomen is soft. There is no hepatomegaly or mass.     Tenderness: There is abdominal tenderness. There is no guarding or  rebound.  Musculoskeletal:     Comments: Tender trigger points upper and lower back,chest wall,and extremities. No signs of synovitis.  Lymphadenopathy:     Cervical: No cervical adenopathy.  Skin:    General: Skin is warm.     Findings: No erythema or rash.  Neurological:     Mental Status: She is alert and oriented to person, place, and time.     Cranial Nerves: No cranial nerve deficit.     Gait: Gait normal.  Psychiatric:  Mood and Affect: Mood is anxious.   ASSESSMENT AND PLAN:  Ms. Evelyn Lopez was seen today for follow-up.  Diagnoses and all orders for this visit:  Polyarthralgia Some rheumatologic work up was positive. She has no insurance, so she does not need a referral, she will call and try to arrange appt with rheumatologist. After discussion of some side effects, she agrees with trying Cymbalta.  -     DULoxetine (CYMBALTA) 30 MG capsule; Take 1 capsule (30 mg total) by mouth daily.  Fibromyalgia Treatment options discussed. Agrees with trying Cymbalta. Low impact exercise and good sleep hygiene.  -     DULoxetine (CYMBALTA) 30 MG capsule; Take 1 capsule (30 mg total) by mouth daily.  Bipolar II disorder [facility]) Following with psychiatrist. She will let her psychiatrist know about new med. Instructed about warning signs.   No follow-ups on file.   Lamerle Jabs G. [place], MD  [facility]. [place] office.   A few things to remember from today's visit: History and examination today suggest fibromyalgia. Chronic disease that affects pain perception.  Rheumatologic evaluation was recommend because some labs were abnormal, so I still would like for you to arrange appt.  Low impact exercise. Cymbalta 30 mg added today, please let your psychiatrist know.  In 3-4 weeks we could increase dose of Cymbalta if well tolerated.  If you need refills please call your pharmacy. Do not use My Chart to request refills or for acute issues that need  immediate attention.   Please be sure medication list is accurate. If a new problem present, please set up appointment sooner than planned today.   SENT TO WORK-QUE - TO SCHEDULE  Dr. Pollyann Savoy, MD Rheumatologist [place] Address: [address], [place], [place] [address] Phone: [phone]       If we have ordered labs or studies at this visit, it can take up to 1-2 weeks for results and processing. IF results require follow up or explanation, we will call you with instructions. Clinically stable results will be released to your [facility]. If you have not heard from [place] or cannot find your results in [facility] in 2 weeks please contact our office at [phone].  If you are not yet signed up for [facility], please consider signing up.

## 2020-01-26 ENCOUNTER — Encounter: Payer: Self-pay | Admitting: Family Medicine

## 2020-01-27 ENCOUNTER — Ambulatory Visit (HOSPITAL_COMMUNITY)
Admission: RE | Admit: 2020-01-27 | Discharge: 2020-01-27 | Disposition: A | Discharge status: Home / Self Care | Payer: Self-pay | Source: Ambulatory Visit | Attending: Nurse Practitioner | Admitting: Nurse Practitioner

## 2020-01-27 ENCOUNTER — Other Ambulatory Visit: Payer: Self-pay

## 2020-01-27 DIAGNOSIS — G8929 Other chronic pain: Secondary | ICD-10-CM | POA: Insufficient documentation

## 2020-01-27 DIAGNOSIS — R519 Headache, unspecified: Secondary | ICD-10-CM | POA: Insufficient documentation

## 2020-01-27 DIAGNOSIS — R112 Nausea with vomiting, unspecified: Secondary | ICD-10-CM | POA: Insufficient documentation

## 2020-01-27 MED ORDER — GADOBUTROL 1 MMOL/ML IV SOLN
10.0000 mL | Freq: Once | INTRAVENOUS | Status: AC | PRN
  Administered 2020-01-27: 10 mL via INTRAVENOUS

## 2020-01-31 ENCOUNTER — Telehealth: Payer: Self-pay | Admitting: Nurse Practitioner

## 2020-01-31 NOTE — Telephone Encounter (Signed)
Spoke with patient, see 8/14 MRI result note for more information

## 2020-02-06 ENCOUNTER — Other Ambulatory Visit: Payer: Self-pay

## 2020-02-06 DIAGNOSIS — M2550 Pain in unspecified joint: Secondary | ICD-10-CM

## 2020-02-06 DIAGNOSIS — M797 Fibromyalgia: Secondary | ICD-10-CM

## 2020-02-06 MED ORDER — DULOXETINE HCL 30 MG PO CPEP: 30.0000 mg | ORAL_CAPSULE | Freq: Every day | ORAL | 1 refills | Status: DC

## 2020-02-29 ENCOUNTER — Encounter: Payer: Self-pay | Admitting: Family Medicine

## 2020-03-04 ENCOUNTER — Encounter: Payer: Self-pay | Admitting: Family Medicine

## 2020-04-08 ENCOUNTER — Encounter: Payer: Self-pay | Admitting: Family Medicine

## 2020-04-12 ENCOUNTER — Other Ambulatory Visit: Payer: Self-pay

## 2020-04-12 DIAGNOSIS — M797 Fibromyalgia: Secondary | ICD-10-CM

## 2020-04-17 ENCOUNTER — Encounter: Payer: Self-pay | Attending: Physical Medicine and Rehabilitation | Admitting: Physical Medicine and Rehabilitation

## 2020-10-01 ENCOUNTER — Encounter: Payer: Self-pay | Admitting: Family Medicine

## 2020-10-30 ENCOUNTER — Encounter: Payer: Self-pay | Admitting: Family Medicine

## 2021-02-19 ENCOUNTER — Encounter: Payer: Self-pay | Admitting: Family Medicine

## 2021-03-04 ENCOUNTER — Ambulatory Visit: Payer: Medicaid Other | Admitting: Family Medicine

## 2021-04-23 ENCOUNTER — Encounter: Payer: Self-pay | Admitting: Family Medicine

## 2021-04-23 DIAGNOSIS — M722 Plantar fascial fibromatosis: Secondary | ICD-10-CM

## 2021-04-23 DIAGNOSIS — M21619 Bunion of unspecified foot: Secondary | ICD-10-CM

## 2021-04-30 ENCOUNTER — Ambulatory Visit: Payer: Self-pay | Admitting: Podiatry

## 2021-06-15 DIAGNOSIS — U071 COVID-19: Secondary | ICD-10-CM

## 2021-06-15 HISTORY — DX: COVID-19: U07.1

## 2021-11-27 ENCOUNTER — Other Ambulatory Visit: Payer: Self-pay | Admitting: Obstetrics and Gynecology

## 2021-11-27 DIAGNOSIS — D649 Anemia, unspecified: Secondary | ICD-10-CM

## 2021-12-01 ENCOUNTER — Non-Acute Institutional Stay (HOSPITAL_COMMUNITY)
Admission: RE | Admit: 2021-12-01 | Discharge: 2021-12-01 | Disposition: A | Discharge status: Home / Self Care | Payer: Commercial Managed Care - HMO | Source: Ambulatory Visit | Attending: Internal Medicine | Admitting: Internal Medicine

## 2021-12-01 DIAGNOSIS — D649 Anemia, unspecified: Secondary | ICD-10-CM | POA: Insufficient documentation

## 2021-12-01 MED ORDER — DIPHENHYDRAMINE HCL 25 MG PO CAPS
25.0000 mg | ORAL_CAPSULE | ORAL | Status: DC
  Administered 2021-12-01: 25 mg via ORAL
  Filled 2021-12-01: qty 1

## 2021-12-01 MED ORDER — SODIUM CHLORIDE 0.9 % IV SOLN
500.0000 mg | INTRAVENOUS | Status: DC
  Administered 2021-12-01: 500 mg via INTRAVENOUS
  Filled 2021-12-01: qty 25

## 2021-12-01 MED ORDER — SODIUM CHLORIDE 0.9 % IV SOLN: INTRAVENOUS | Status: DC | PRN

## 2021-12-01 MED ORDER — ACETAMINOPHEN 500 MG PO TABS
500.0000 mg | ORAL_TABLET | ORAL | Status: DC
  Administered 2021-12-01: 500 mg via ORAL
  Filled 2021-12-01: qty 1

## 2021-12-01 MED ORDER — ONDANSETRON HCL 4 MG PO TABS
4.0000 mg | ORAL_TABLET | Freq: Once | ORAL | Status: AC
  Administered 2021-12-01: 4 mg via ORAL
  Filled 2021-12-01: qty 1

## 2021-12-01 NOTE — Progress Notes (Signed)
PATIENT CARE CENTER NOTE:  Diagnosis: D64.9 Anemia unspecified type  Provider: Rhoderick Moody  Procedure: Venofer 500mg     Patient received IV Venofer ( dose 1 of 2). Premedicated with Tylenol and Benadryl PO per orders. 2 hours into infusion, pt stated she was having increased nausea. Pt reports intermittent nausea for a few days. Dr. RN Martha Clan, notified, verbal order given to administer 4mg  Zofran PO x 1 and Zofran prescription sent in to pharmacy by provider, pt made aware. Tolerated well, vitals stable, discharge instructions given , verbalized understanding. Pt scheduled for next infusion on 12/17/21, pt made aware verbalized understanding. Patient alert, oriented, and ambulatory at the time of discharge accompanied by mother.

## 2021-12-17 ENCOUNTER — Non-Acute Institutional Stay (HOSPITAL_COMMUNITY)
Admission: RE | Admit: 2021-12-17 | Discharge: 2021-12-17 | Disposition: A | Discharge status: Home / Self Care | Payer: Commercial Managed Care - HMO | Source: Ambulatory Visit | Attending: Internal Medicine | Admitting: Internal Medicine

## 2021-12-17 DIAGNOSIS — D649 Anemia, unspecified: Secondary | ICD-10-CM | POA: Insufficient documentation

## 2021-12-17 MED ORDER — SODIUM CHLORIDE 0.9 % IV SOLN: INTRAVENOUS | Status: DC | PRN

## 2021-12-17 MED ORDER — DIPHENHYDRAMINE HCL 25 MG PO CAPS
25.0000 mg | ORAL_CAPSULE | Freq: Once | ORAL | Status: AC
  Administered 2021-12-17: 25 mg via ORAL
  Filled 2021-12-17: qty 1

## 2021-12-17 MED ORDER — SODIUM CHLORIDE 0.9 % IV SOLN
500.0000 mg | Freq: Once | INTRAVENOUS | Status: AC
  Administered 2021-12-17: 500 mg via INTRAVENOUS
  Filled 2021-12-17: qty 25

## 2021-12-17 MED ORDER — ACETAMINOPHEN 500 MG PO TABS
500.0000 mg | ORAL_TABLET | Freq: Once | ORAL | Status: AC
  Administered 2021-12-17: 500 mg via ORAL
  Filled 2021-12-17: qty 1

## 2021-12-17 NOTE — Progress Notes (Signed)
PATIENT CARE CENTER NOTE   Diagnosis: Anemia, unspecified type [D64.9]   Provider: Rhoderick Moody, MD   Procedure: Venofer infusion    Note:  Patient received Venofer 500 mg infusion (dose # 2 of 2) via PIV. Pre-medications given per order. Patient tolerated infusion well. Reported some nausea during infusion but patient has nausea intermittently at home. Patient took Zofran prior to arrival. At completion of infusion patient reported feeling dizzy and "pale". Patient's BP elevated at 148/106. Patient reported that she had not eaten anything today. Patient given snack (apple juice and graham crackers). After eating the snack, patient reported that she felt better and dizziness had subsided. BP rechecked and was 137/88. Notified Dr. Martha Clan nurse, Belenda Cruise, of patient's symptoms. Per Dr. Amado Nash, it's alright to discharge patient if BP remains stable and patient feels alright. Patient advised to contact Dr. Martha Clan office if her symptoms reappear. Vital signs stable (last BP 127/81). Discharge instructions given. Patient alert, oriented and ambulatory at discharge.

## 2021-12-18 ENCOUNTER — Other Ambulatory Visit: Payer: Self-pay | Admitting: Obstetrics and Gynecology

## 2021-12-18 ENCOUNTER — Encounter (HOSPITAL_BASED_OUTPATIENT_CLINIC_OR_DEPARTMENT_OTHER): Payer: Self-pay | Admitting: Obstetrics and Gynecology

## 2021-12-18 NOTE — Progress Notes (Addendum)
Spoke w/ via phone for pre-op interview--- patient Lab needs dos----  urine pregnancy             Lab results------ in EPIC COVID test -----patient states asymptomatic no test needed Arrive at ------- 1230 on Tuesday 12/30/21 NPO after MN NO Solid Food.  Clear liquids from MN until--- 1130 on 12/30/21 Med rec completed Medications to take morning of surgery ----- xanax, buspar, afrin nasal spray Diabetic medication ----- n/a Patient instructed no nail polish to be worn day of surgery Patient instructed to bring photo id and insurance card day of surgery Patient aware to have Driver (ride ) / caregiver    for 24 hours after surgery -- Evelyn Lopez (mother) [phone] Patient Special Instructions ----- advised patient to contact surgeon's office to inquire about taking aygestin DOS Pre-Op special Istructions ----- none Patient verbalized understanding of instructions that were given at this phone interview. Patient denies shortness of breath, chest pain, fever, cough at this phone interview.  Pt smokes 1/4 ppd and marijuana 2xs/week, advised to abstain 24 hours prior to surgery. Pt had COVID in 06/2021, planning on wearing mask prior to surgery. Advised pt to contact Dr. Martha Clan office about taking aygestin DOS. Msg sent via Epic IB to Dr. Amado Nash requesting orders.   Frances Furbish, RN

## 2021-12-30 ENCOUNTER — Other Ambulatory Visit: Payer: Self-pay

## 2021-12-30 ENCOUNTER — Ambulatory Visit (HOSPITAL_BASED_OUTPATIENT_CLINIC_OR_DEPARTMENT_OTHER): Payer: Commercial Managed Care - HMO | Admitting: Anesthesiology

## 2021-12-30 ENCOUNTER — Encounter (HOSPITAL_BASED_OUTPATIENT_CLINIC_OR_DEPARTMENT_OTHER)
Admission: RE | Disposition: A | Discharge status: Home / Self Care | Payer: Self-pay | Source: Ambulatory Visit | Attending: Obstetrics and Gynecology

## 2021-12-30 ENCOUNTER — Ambulatory Visit (HOSPITAL_BASED_OUTPATIENT_CLINIC_OR_DEPARTMENT_OTHER)
Admission: RE | Admit: 2021-12-30 | Discharge: 2021-12-30 | Disposition: A | Discharge status: Home / Self Care | Payer: Commercial Managed Care - HMO | Source: Ambulatory Visit | Attending: Obstetrics and Gynecology | Admitting: Obstetrics and Gynecology

## 2021-12-30 ENCOUNTER — Encounter (HOSPITAL_BASED_OUTPATIENT_CLINIC_OR_DEPARTMENT_OTHER): Payer: Self-pay | Admitting: Obstetrics and Gynecology

## 2021-12-30 DIAGNOSIS — N920 Excessive and frequent menstruation with regular cycle: Secondary | ICD-10-CM | POA: Diagnosis not present

## 2021-12-30 DIAGNOSIS — D649 Anemia, unspecified: Secondary | ICD-10-CM

## 2021-12-30 DIAGNOSIS — N939 Abnormal uterine and vaginal bleeding, unspecified: Secondary | ICD-10-CM | POA: Insufficient documentation

## 2021-12-30 DIAGNOSIS — F17210 Nicotine dependence, cigarettes, uncomplicated: Secondary | ICD-10-CM | POA: Insufficient documentation

## 2021-12-30 DIAGNOSIS — Z6841 Body Mass Index (BMI) 40.0 and over, adult: Secondary | ICD-10-CM | POA: Diagnosis not present

## 2021-12-30 DIAGNOSIS — N840 Polyp of corpus uteri: Secondary | ICD-10-CM | POA: Diagnosis not present

## 2021-12-30 DIAGNOSIS — N938 Other specified abnormal uterine and vaginal bleeding: Secondary | ICD-10-CM

## 2021-12-30 HISTORY — DX: Restless legs syndrome: G25.81

## 2021-12-30 HISTORY — PX: DILATATION & CURETTAGE/HYSTEROSCOPY WITH MYOSURE: SHX6511

## 2021-12-30 LAB — BASIC METABOLIC PANEL
Anion gap: 8 (ref 5–15)
BUN: 9 mg/dL (ref 6–20)
CO2: 22 mmol/L (ref 22–32)
Calcium: 9.1 mg/dL (ref 8.9–10.3)
Chloride: 111 mmol/L (ref 98–111)
Creatinine, Ser: 0.91 mg/dL (ref 0.44–1.00)
GFR, Estimated: >60 mL/min (ref >60)
Glucose, Bld: 93 mg/dL (ref 70–99)
Potassium: 4.4 mmol/L (ref 3.5–5.1)
Sodium: 141 mmol/L (ref 135–145)

## 2021-12-30 LAB — HCG, SERUM, QUALITATIVE: Preg, Serum: NEGATIVE

## 2021-12-30 LAB — CBC
HCT: 37.4 % (ref 36.0–46.0)
Hemoglobin: 10.5 g/dL — ABNORMAL LOW (ref 12.0–15.0)
MCH: 19.8 pg — ABNORMAL LOW (ref 26.0–34.0)
MCHC: 28.1 g/dL — ABNORMAL LOW (ref 30.0–36.0)
MCV: 70.7 fL — ABNORMAL LOW (ref 80.0–100.0)
Platelets: 373 10*3/uL (ref 150–400)
RBC: 5.29 MIL/uL — ABNORMAL HIGH (ref 3.87–5.11)
RDW: 28.3 % — ABNORMAL HIGH (ref 11.5–15.5)
WBC: 7.6 10*3/uL (ref 4.0–10.5)
nRBC: 0 % (ref 0.0–0.2)

## 2021-12-30 LAB — TYPE AND SCREEN
ABO/RH(D): O POS
Antibody Screen: NEGATIVE

## 2021-12-30 LAB — POCT PREGNANCY, URINE: Preg Test, Ur: NEGATIVE

## 2021-12-30 SURGERY — DILATATION & CURETTAGE/HYSTEROSCOPY WITH MYOSURE
Anesthesia: General | Site: Pelvis

## 2021-12-30 MED ORDER — ONDANSETRON HCL 4 MG/2ML IJ SOLN: 4.0000 mg | Freq: Once | INTRAMUSCULAR | Status: DC | PRN

## 2021-12-30 MED ORDER — FENTANYL CITRATE (PF) 100 MCG/2ML IJ SOLN
INTRAMUSCULAR | Status: AC
  Filled 2021-12-30: qty 2

## 2021-12-30 MED ORDER — FENTANYL CITRATE (PF) 100 MCG/2ML IJ SOLN
INTRAMUSCULAR | Status: DC | PRN
  Administered 2021-12-30 (×2): 25 ug via INTRAVENOUS
  Administered 2021-12-30: 50 ug via INTRAVENOUS

## 2021-12-30 MED ORDER — ONDANSETRON HCL 4 MG/2ML IJ SOLN
INTRAMUSCULAR | Status: DC | PRN
  Administered 2021-12-30: 4 mg via INTRAVENOUS

## 2021-12-30 MED ORDER — OXYCODONE HCL 5 MG/5ML PO SOLN: 5.0000 mg | Freq: Once | ORAL | Status: AC | PRN

## 2021-12-30 MED ORDER — SODIUM CHLORIDE 0.9 % IR SOLN
Status: DC | PRN
  Administered 2021-12-30: 3000 mL

## 2021-12-30 MED ORDER — KETOROLAC TROMETHAMINE 30 MG/ML IJ SOLN
INTRAMUSCULAR | Status: AC
  Filled 2021-12-30: qty 3

## 2021-12-30 MED ORDER — LIDOCAINE 2% (20 MG/ML) 5 ML SYRINGE
INTRAMUSCULAR | Status: DC | PRN
  Administered 2021-12-30: 100 mg via INTRAVENOUS

## 2021-12-30 MED ORDER — ONDANSETRON HCL 4 MG/2ML IJ SOLN
INTRAMUSCULAR | Status: AC
  Filled 2021-12-30: qty 8

## 2021-12-30 MED ORDER — PROPOFOL 10 MG/ML IV BOLUS
INTRAVENOUS | Status: AC
  Filled 2021-12-30: qty 20

## 2021-12-30 MED ORDER — LIDOCAINE-EPINEPHRINE (PF) 1 %-1:200000 IJ SOLN
INTRAMUSCULAR | Status: DC | PRN
  Administered 2021-12-30: 8 mL

## 2021-12-30 MED ORDER — MIDAZOLAM HCL 2 MG/2ML IJ SOLN
INTRAMUSCULAR | Status: AC
  Filled 2021-12-30: qty 2

## 2021-12-30 MED ORDER — FENTANYL CITRATE (PF) 100 MCG/2ML IJ SOLN: 25.0000 ug | INTRAMUSCULAR | Status: DC | PRN

## 2021-12-30 MED ORDER — ACETAMINOPHEN 500 MG PO TABS
ORAL_TABLET | ORAL | Status: AC
  Filled 2021-12-30: qty 2

## 2021-12-30 MED ORDER — LACTATED RINGERS IV SOLN: INTRAVENOUS | Status: DC

## 2021-12-30 MED ORDER — DEXAMETHASONE SODIUM PHOSPHATE 10 MG/ML IJ SOLN
INTRAMUSCULAR | Status: DC | PRN
  Administered 2021-12-30: 10 mg via INTRAVENOUS

## 2021-12-30 MED ORDER — KETOROLAC TROMETHAMINE 30 MG/ML IJ SOLN
INTRAMUSCULAR | Status: DC | PRN
  Administered 2021-12-30: 30 mg via INTRAVENOUS

## 2021-12-30 MED ORDER — PHENYLEPHRINE 80 MCG/ML (10ML) SYRINGE FOR IV PUSH (FOR BLOOD PRESSURE SUPPORT)
PREFILLED_SYRINGE | INTRAVENOUS | Status: AC
  Filled 2021-12-30: qty 20

## 2021-12-30 MED ORDER — ACETAMINOPHEN 500 MG PO TABS
1000.0000 mg | ORAL_TABLET | Freq: Once | ORAL | Status: AC
  Administered 2021-12-30: 1000 mg via ORAL

## 2021-12-30 MED ORDER — LIDOCAINE HCL (PF) 2 % IJ SOLN
INTRAMUSCULAR | Status: AC
  Filled 2021-12-30: qty 20

## 2021-12-30 MED ORDER — DEXAMETHASONE SODIUM PHOSPHATE 10 MG/ML IJ SOLN
INTRAMUSCULAR | Status: AC
  Filled 2021-12-30: qty 4

## 2021-12-30 MED ORDER — OXYCODONE HCL 5 MG PO TABS
ORAL_TABLET | ORAL | Status: AC
  Filled 2021-12-30: qty 1

## 2021-12-30 MED ORDER — ROCURONIUM BROMIDE 10 MG/ML (PF) SYRINGE
PREFILLED_SYRINGE | INTRAVENOUS | Status: AC
  Filled 2021-12-30: qty 20

## 2021-12-30 MED ORDER — POVIDONE-IODINE 10 % EX SWAB: 2.0000 | Freq: Once | CUTANEOUS | Status: DC

## 2021-12-30 MED ORDER — AMISULPRIDE (ANTIEMETIC) 5 MG/2ML IV SOLN: 10.0000 mg | Freq: Once | INTRAVENOUS | Status: DC | PRN

## 2021-12-30 MED ORDER — MIDAZOLAM HCL 2 MG/2ML IJ SOLN
INTRAMUSCULAR | Status: DC | PRN
  Administered 2021-12-30: 2 mg via INTRAVENOUS

## 2021-12-30 MED ORDER — PROPOFOL 10 MG/ML IV BOLUS
INTRAVENOUS | Status: DC | PRN
  Administered 2021-12-30: 200 mg via INTRAVENOUS

## 2021-12-30 MED ORDER — OXYCODONE HCL 5 MG PO TABS
5.0000 mg | ORAL_TABLET | Freq: Once | ORAL | Status: AC | PRN
  Administered 2021-12-30: 5 mg via ORAL

## 2021-12-30 SURGICAL SUPPLY — 20 items
CATH ROBINSON RED A/P 16FR (CATHETERS) ×2 IMPLANT
DEVICE MYOSURE LITE (MISCELLANEOUS) IMPLANT
DEVICE MYOSURE REACH (MISCELLANEOUS) ×1 IMPLANT
DILATOR CANAL MILEX (MISCELLANEOUS) IMPLANT
DRSG TELFA 3X8 NADH (GAUZE/BANDAGES/DRESSINGS) ×2 IMPLANT
GAUZE 4X4 16PLY ~~LOC~~+RFID DBL (SPONGE) ×2 IMPLANT
GLOVE BIOGEL PI IND STRL 6.5 (GLOVE) ×1 IMPLANT
GLOVE BIOGEL PI INDICATOR 6.5 (GLOVE) ×1
GLOVE ECLIPSE 6.0 STRL STRAW (GLOVE) ×2 IMPLANT
GOWN STRL REUS W/TWL LRG LVL3 (GOWN DISPOSABLE) ×2 IMPLANT
HIBICLENS CHG 4% 4OZ BTL (MISCELLANEOUS) IMPLANT
IV NS IRRIG 3000ML ARTHROMATIC (IV SOLUTION) ×2 IMPLANT
KIT PROCEDURE FLUENT (KITS) ×2 IMPLANT
KIT TURNOVER CYSTO (KITS) ×2 IMPLANT
PACK VAGINAL MINOR WOMEN LF (CUSTOM PROCEDURE TRAY) ×2 IMPLANT
PAD DRESSING TELFA 3X8 NADH (GAUZE/BANDAGES/DRESSINGS) ×1 IMPLANT
PAD OB MATERNITY 4.3X12.25 (PERSONAL CARE ITEMS) ×2 IMPLANT
SEAL CERVICAL OMNI LOK (ABLATOR) IMPLANT
SEAL ROD LENS SCOPE MYOSURE (ABLATOR) ×2 IMPLANT
TOWEL OR 17X26 10 PK STRL BLUE (TOWEL DISPOSABLE) ×2 IMPLANT

## 2021-12-30 NOTE — H&P (Signed)
Evelyn Lopez is an 41 y.o. female G3P0,here today for hysteroscopy, D&C and polypectomy.  She has a  h/o menorrhagia and SIS showed endometrial polyps - 3, 1 cm and 2cm x2.  She is currently on aygestin to manage the bleeding. She does have a h/o aub with anemia in past.  Pertinent Gynecological History: See above  Menstrual History:  Patient's last menstrual period was 08/16/2021 (approximate).    Past Medical History:  Diagnosis Date   Alcohol dependence (HCC)    Bipolar disorder (HCC)    Chronic back pain    COVID 06/2021   Ectopic pregnancy    June 2013   Fatty liver- NAFLD vs EtOH or both 03/07/2019   Iron deficiency anemia due to chronic blood loss 03/07/2019   Multiple personalities (HCC)    Ovarian cyst    Panic attacks    pt states she has "seizure like symptoms" w/ panic attack, has had MRI and neuro r/o seizures/epilepsy   Persistent vomiting 03/07/2019   PTSD (post-traumatic stress disorder)    Restless leg syndrome     Past Surgical History:  Procedure Laterality Date   COLONOSCOPY     ESOPHAGOGASTRODUODENOSCOPY     ESOPHAGOGASTRODUODENOSCOPY (EGD) WITH PROPOFOL N/A 03/07/2019   Procedure: ESOPHAGOGASTRODUODENOSCOPY (EGD) WITH PROPOFOL;  Surgeon: Iva Boop, MD;  Location: [facility];  Service: Endoscopy;  Laterality: N/A;   LAPAROSCOPY  12/29/2011   Procedure: LAPAROSCOPY OPERATIVE;  Surgeon: Tresa Endo A. Ernestina Penna, MD;  Location: [facility];  Service: Gynecology;  Laterality: Bilateral;  Right Salpingectomy   SALPINGOOPHORECTOMY Right    d/t ectopic pregnancy    Family History  Problem Relation Age of Onset   High blood pressure Mother    Irritable bowel syndrome Mother    Diabetes Father    Heart disease Father    High blood pressure Father    Other Father        fatty liver   Colon cancer Neg Hx    Esophageal cancer Neg Hx     Social History:  reports that she has been smoking cigarettes. She has a 5.75 pack-year smoking history. She has never  used smokeless tobacco. She reports that she does not currently use alcohol. She reports current drug use. Frequency: 3.00 times per week. Drug: Marijuana.  Allergies:  Allergies  Allergen Reactions   Rocephin [Ceftriaxone Sodium In Dextrose] Anaphylaxis    Medications Prior to Admission  Medication Sig Dispense Refill Last Dose   ALPRAZolam (XANAX) 1 MG tablet Take 1 mg by mouth 4 (four) times daily.   12/29/2021   benztropine (COGENTIN) 1 MG tablet Take 1 mg by mouth at bedtime.   12/29/2021   ferrous sulfate 325 (65 FE) MG tablet Take 1 tablet (325 mg total) by mouth daily. 90 tablet 3 12/29/2021   hydrOXYzine (VISTARIL) 50 MG capsule Take 50 mg by mouth 2 (two) times daily. In the afternoon & at bedtime.   12/29/2021 at 0000   ibuprofen (ADVIL) 800 MG tablet Take 800 mg by mouth every 8 (eight) hours as needed for mild pain.   12/29/2021   norethindrone (AYGESTIN) 5 MG tablet Take 5 mg by mouth in the morning, at noon, and at bedtime. Tapers dose while bleeding   12/30/2021 at 0945   oxymetazoline (AFRIN) 0.05 % nasal spray Place 1 spray into both nostrils 2 (two) times daily.   12/29/2021   pramipexole (MIRAPEX) 1.5 MG tablet Take 1.5 mg by mouth at bedtime.   12/29/2021  rOPINIRole (REQUIP) 1 MG tablet Take 1 mg by mouth at bedtime.   Past Week   busPIRone (BUSPAR) 10 MG tablet Take 10 mg by mouth 2 (two) times daily. Morning & afternoon   12/28/2021   EPINEPHrine 0.3 mg/0.3 mL IJ SOAJ injection Inject 0.3 mLs (0.3 mg total) into the muscle as needed for anaphylaxis. 1 each 1 More than a month    ROS SOB/chest pain/ HA/ vision changes/ LE pain or swelling/ etc.  Blood pressure (!) 158/98, pulse 80, temperature 98.4 F (36.9 C), temperature source Oral, resp. rate 18, height 5\' 9"  (1.753 m), weight 123.4 kg, last menstrual period 08/16/2021, SpO2 98 %. Physical Exam A&O x 3 HEENT grossly wnl Lungs : ctab CV : rrr Abdo : soft,nt,nd Extr : no edema, nt bilat Pelvic :  deferred   Results for orders placed or performed during the hospital encounter of 12/30/21 (from the past 24 hour(s))  Pregnancy, urine POC     Status: None   Collection Time: 12/30/21  1:02 PM  Result Value Ref Range   Preg Test, Ur NEGATIVE NEGATIVE  Type and screen     Status: None   Collection Time: 12/30/21  1:26 PM  Result Value Ref Range   ABO/RH(D) O POS    Antibody Screen NEG    Sample Expiration      01/02/2022,2359 Performed at [facility], [address], [place], [place]   CBC     Status: Abnormal   Collection Time: 12/30/21  1:26 PM  Result Value Ref Range   WBC 7.6 4.0 - 10.5 K/uL   RBC 5.29 (H) 3.87 - 5.11 MIL/uL   Hemoglobin 10.5 (L) 12.0 - 15.0 g/dL   HCT 01/01/22 81.0 - 17.5 %   MCV 70.7 (L) 80.0 - 100.0 fL   MCH 19.8 (L) 26.0 - 34.0 pg   MCHC 28.1 (L) 30.0 - 36.0 g/dL   RDW 10.2 (H) 58.5 - 27.7 %   Platelets 373 150 - 400 K/uL   nRBC 0.0 0.0 - 0.2 %  Basic metabolic panel     Status: None   Collection Time: 12/30/21  1:26 PM  Result Value Ref Range   Sodium 141 135 - 145 mmol/L   Potassium 4.4 3.5 - 5.1 mmol/L   Chloride 111 98 - 111 mmol/L   CO2 22 22 - 32 mmol/L   Glucose, Bld 93 70 - 99 mg/dL   BUN 9 6 - 20 mg/dL   Creatinine, Ser 01/01/22 0.44 - 1.00 mg/dL   Calcium 9.1 8.9 - 2.35 mg/dL   GFR, Estimated 36.1 >44 mL/min   Anion gap 8 5 - 15  hCG, serum, qualitative     Status: None   Collection Time: 12/30/21  1:26 PM  Result Value Ref Range   Preg, Serum NEGATIVE NEGATIVE    No results found.  Assessment/Plan: 41 y/o G3P0,endometrial polyps Endometrial polyps - proceed to OR for planned procedure; risk of bleeding, infection, uterine perforation, risk of anesthesia, risk of blood clot to leg/lung, risk of further surgery; consent signed Menorrhagia - will continue aytesting and begin to taper down; reassess at post op in 2 wks Anemia - improving - s/p IV iron, improved from 7.6/24.5 contin iron qd    10-20-1972 12/30/2021, 3:22 PM

## 2021-12-30 NOTE — Anesthesia Postprocedure Evaluation (Signed)
Anesthesia Post Note  Patient: Evelyn Lopez  Procedure(s) Performed: DILATATION & CURETTAGE/HYSTEROSCOPY WITH MYOSURE (Pelvis)     Patient location during evaluation: PACU Anesthesia Type: General Level of consciousness: awake and alert Pain management: pain level controlled Vital Signs Assessment: post-procedure vital signs reviewed and stable Respiratory status: spontaneous breathing, nonlabored ventilation and respiratory function stable Cardiovascular status: blood pressure returned to baseline and stable Postop Assessment: no apparent nausea or vomiting Anesthetic complications: no   No notable events documented.  Last Vitals:  Vitals:   12/30/21 1635 12/30/21 1645  BP: (!) 152/75 130/77  Pulse: 81 78  Resp: 16 17  Temp: 36.7 C   SpO2: 99% 99%    Last Pain:  Vitals:   12/30/21 1645  TempSrc:   PainSc: 6                  Evelyn Lopez

## 2021-12-30 NOTE — Discharge Instructions (Addendum)
Continue home medications as normal per Dr. Amado Nash  No acetaminophen/Tylenol until after 7:45 pm today if needed.   No ibuprofen, Advil, Aleve, Motrin, ketorolac, meloxicam, naproxen, or other NSAIDS until after 10:30 pm today if needed.    Post Anesthesia Home Care Instructions  Activity: Get plenty of rest for the remainder of the day. A responsible individual must stay with you for 24 hours following the procedure.  For the next 24 hours, DO NOT: -Drive a car -Advertising copywriter -Drink alcoholic beverages -Take any medication unless instructed by your physician -Make any legal decisions or sign important papers.  Meals: Start with liquid foods such as gelatin or soup. Progress to regular foods as tolerated. Avoid greasy, spicy, heavy foods. If nausea and/or vomiting occur, drink only clear liquids until the nausea and/or vomiting subsides. Call your physician if vomiting continues.  Special Instructions/Symptoms: Your throat may feel dry or sore from the anesthesia or the breathing tube placed in your throat during surgery. If this causes discomfort, gargle with warm salt water. The discomfort should disappear within 24 hours.   D & C Home care Instructions:   Personal hygiene:  Used sanitary napkins for vaginal drainage not tampons. Shower or tub bathe the day after your procedure. No douching until bleeding stops. Always wipe from front to back after  Elimination.  Activity: Do not drive or operate any equipment today. The effects of the anesthesia are still present and drowsiness may result. Rest today, not necessarily flat bed rest, just take it easy. You may resume your normal activity in one to 2 days.  Sexual activity: No intercourse for one week or as indicated by your physician  Diet: Eat a light diet as desired this evening. You may resume a regular diet tomorrow.  Return to work: One to 2 days.  General Expectations of your surgery: Vaginal bleeding should be no  heavier than a normal period. Spotting may continue up to 10 days. Mild cramps may continue for a couple of days. You may have a regular period in 2-6 weeks.  Unexpected observations call your doctor if these occur: persistent or heavy bleeding. Severe abdominal cramping or pain. Elevation of temperature greater than 100F.  Call for an appointment in one week.

## 2021-12-30 NOTE — Anesthesia Procedure Notes (Signed)
Procedure Name: LMA Insertion Date/Time: 12/30/2021 3:50 PM  Performed by: Dairl Ponder, CRNAPre-anesthesia Checklist: Patient identified, Emergency Drugs available, Suction available and Patient being monitored Patient Re-evaluated:Patient Re-evaluated prior to induction Oxygen Delivery Method: Circle System Utilized Preoxygenation: Pre-oxygenation with 100% oxygen Induction Type: IV induction Ventilation: Mask ventilation without difficulty LMA: LMA inserted LMA Size: 4.0 Number of attempts: 1 Airway Equipment and Method: Bite block Placement Confirmation: positive ETCO2 Tube secured with: Tape Dental Injury: Teeth and Oropharynx as per pre-operative assessment

## 2021-12-30 NOTE — Op Note (Signed)
Preoperative diagnosis: aub, menorrhagia to anemia, endometrial polyps Postop diagnosis: as above.  Procedure: Hysteroscopic polypectomy, D&C Anesthesia General via LMA  Surgeon: Rhoderick Moody, MD  Assistant: none IV fluids : 360 ml LR Estimated blood loss : 45ml Urine output: straight catheter preop   Complications none  Condition stable  Disposition PACU  Specimen: endometrial polyps with endometrial curettings   Procedure  Indication: Menorrhagia. Office sono noted 3 endometrial polyps. Patient was counseled on risks/ complications including infection, bleeding, damage to internal organs, she understood and agrees, gave informed written consent.  Patient was brought to the operating room with IV running. Time out was carried out.  She underwent general anesthesia via LMA without complications. She was given dorsolithotomy position. The patient was prepped and draped in standard fashion. Bladder was catheterized once. Bimanual exam revealed uterus to be anteverted and normal size. Speculum was placed and cervix was grasped with single-tooth tenaculum. Cervical block with 20 cc 1% lidocaine with epinephrine given. The uterus was sounded to 8.5 cm. Cervical os was dilated to 19 [place] dilator. Hysteroscope was introduced in the uterine cavity under vision.  Findings: Very irregular contour of cavity, appeared to be few polyps. Hysteroscopic polypectomy was performed with myosure reach and Specimen sent to path. Endometrial curettage was performed with the myosure. All tissue sent to path.  Fluid deficit 240 cc.  All counts are correct x2. No complications. Patient was made supine dorsal anesthesia and brought to the recovery room in stable condition.  Patient will be discharged home today. Discharge meds - has ibuprofen at home, will refill ayegestin until post op. Follow up in 2 weeks in office. Warning signs of infection and excessive bleeding reviewed.

## 2021-12-30 NOTE — Anesthesia Preprocedure Evaluation (Signed)
Anesthesia Evaluation  Patient identified by MRN, date of birth, ID band Patient awake    Reviewed: Allergy & Precautions, NPO status , Patient's Chart, lab work & pertinent test results  History of Anesthesia Complications Negative for: history of anesthetic complications  Airway Mallampati: II  TM Distance: >3 FB Neck ROM: Full    Dental  (+) Dental Advisory Given, Teeth Intact   Pulmonary neg pulmonary ROS, Current Smoker,    Pulmonary exam normal        Cardiovascular negative cardio ROS Normal cardiovascular exam     Neuro/Psych Anxiety Bipolar Disorder negative neurological ROS     GI/Hepatic negative GI ROS, (+)     substance abuse  alcohol use and marijuana use, Fatty liver   Endo/Other  Morbid obesity  Renal/GU negative Renal ROS  negative genitourinary   Musculoskeletal negative musculoskeletal ROS (+)   Abdominal   Peds  Hematology  (+) Blood dyscrasia, anemia ,   Anesthesia Other Findings   Reproductive/Obstetrics Endometrial Polyps                             Anesthesia Physical Anesthesia Plan  ASA: 3  Anesthesia Plan: General   Post-op Pain Management: Tylenol PO (pre-op)* and Toradol IV (intra-op)*   Induction: Intravenous  PONV Risk Score and Plan: 2 and Ondansetron, Dexamethasone, Midazolam and Treatment may vary due to age or medical condition  Airway Management Planned: LMA  Additional Equipment: None  Intra-op Plan:   Post-operative Plan: Extubation in OR  Informed Consent: I have reviewed the patients History and Physical, chart, labs and discussed the procedure including the risks, benefits and alternatives for the proposed anesthesia with the patient or authorized representative who has indicated his/her understanding and acceptance.     Dental advisory given  Plan Discussed with:   Anesthesia Plan Comments:         Anesthesia Quick  Evaluation

## 2021-12-30 NOTE — Transfer of Care (Signed)
Immediate Anesthesia Transfer of Care Note  Patient: Evelyn Lopez  Procedure(s) Performed: DILATATION & CURETTAGE/HYSTEROSCOPY WITH MYOSURE (P[place])  Patient Location: PACU  Anesthesia Type:General  Level of Consciousness: awake, alert  and oriented  Airway & Oxygen Therapy: Patient Spontanous Breathing  Post-op Assessment: Report given to RN and Post -op Vital signs reviewed and stable  Post vital signs: Reviewed and stable  Last Vitals:  Vitals Value Taken Time  BP 152/75 12/30/21 1635  Temp    Pulse 87 12/30/21 1638  Resp 17 12/30/21 1638  SpO2 99 % 12/30/21 1638  Vitals shown include unvalidated device data.  Last Pain:  Vitals:   12/30/21 1340  TempSrc: Oral  PainSc: 0-No pain      Patients Stated Pain Goal: 6 (12/30/21 1340)  Complications: No notable events documented.

## 2021-12-31 ENCOUNTER — Encounter (HOSPITAL_BASED_OUTPATIENT_CLINIC_OR_DEPARTMENT_OTHER): Payer: Self-pay | Admitting: Obstetrics and Gynecology

## 2022-01-01 LAB — SURGICAL PATHOLOGY

## 2022-03-02 ENCOUNTER — Other Ambulatory Visit: Payer: Self-pay | Admitting: Obstetrics and Gynecology

## 2022-03-03 ENCOUNTER — Telehealth: Payer: Self-pay

## 2022-03-03 NOTE — Telephone Encounter (Signed)
Patient needs an appointment for surgery clearance, thank you!

## 2022-03-11 NOTE — Progress Notes (Unsigned)
HPI: Evelyn Lopez is a 41 y.o. female with hx of vit D def,anxiety,bipolar disorder, iron def anemia,and DUB here today for medical clearance for surgery requested by Dr Murrell Redden.  She is planning on having total hysterectomy and salpingectomy on 04/02/22. General anesthesia. She is going to stay in the hospital 1-2 days after surgery. She has had procedures in the past under general anesthesia: Ectopic pregnancy in 2013 and D&C + hysteroscopy in 12/2011. Negative for complications during or after procedures.  No hx of HTN,CKD,DM II,or CHF. + Tobacco use. Denies any CP,palpitations,SOB,or diaphoresis upon climbing a flight of stairs,carrying heavy groceries,or walking a hill briskly.  She has been dealing with vaginal bleeding for years, has not responded well to hormonal therapy. Iron def anemia, she has had 2 iron infusions in June and July 2023. LMP 2 weeks ago.  Lab Results  Component Value Date   WBC 7.6 12/30/2021   HGB 10.5 (L) 12/30/2021   HCT 37.4 12/30/2021   MCV 70.7 (L) 12/30/2021   PLT 373 12/30/2021    Lab Results  Component Value Date   CREATININE 0.91 12/30/2021   BUN 9 12/30/2021   NA 141 12/30/2021   K 4.4 12/30/2021   CL 111 12/30/2021   CO2 22 12/30/2021   Elevated transaminases. She drinks alcohol occasionally, 2 beers per month. Has not noted upper abdominal pain,nausea,or jaundice.  Lab Results  Component Value Date   ALT 27 01/04/2020   AST 44 (H) 01/04/2020   ALKPHOS 92 10/06/2019   BILITOT 0.3 01/04/2020   Vit D def, she is not on vit D supplementation. 25 OH vit D was 14 in 12/2019.  Review of Systems  Constitutional:  Positive for fatigue. Negative for activity change, appetite change and fever.  HENT:  Negative for mouth sores, nosebleeds and sore throat.   Eyes:  Negative for discharge and redness.  Respiratory:  Negative for cough and wheezing.   Cardiovascular:  Negative for palpitations and leg swelling.  Gastrointestinal:   Negative for vomiting.       Negative for changes in bowel habits.  Endocrine: Negative for cold intolerance and heat intolerance.  Genitourinary:  Positive for menstrual problem. Negative for decreased urine volume, dysuria and hematuria.  Neurological:  Negative for syncope, weakness and headaches.  Psychiatric/Behavioral:  Negative for confusion and hallucinations.   Rest see pertinent positives and negatives per HPI.  Current Outpatient Medications on File Prior to Visit  Medication Sig Dispense Refill   ALPRAZolam (XANAX) 1 MG tablet Take 1 mg by mouth as needed. 4 times daily as needed.     benztropine (COGENTIN) 1 MG tablet Take 1 mg by mouth at bedtime.     busPIRone (BUSPAR) 10 MG tablet Take 10 mg by mouth 2 (two) times daily. Morning & afternoon     EPINEPHrine 0.3 mg/0.3 mL IJ SOAJ injection Inject 0.3 mLs (0.3 mg total) into the muscle as needed for anaphylaxis. 1 each 1   ferrous sulfate 325 (65 FE) MG tablet Take 1 tablet (325 mg total) by mouth daily. 90 tablet 3   hydrOXYzine (VISTARIL) 50 MG capsule Take 50 mg by mouth 2 (two) times daily. In the afternoon & at bedtime.     ibuprofen (ADVIL) 800 MG tablet Take 800 mg by mouth every 8 (eight) hours as needed for mild pain.     norethindrone (AYGESTIN) 5 MG tablet Take 5 mg by mouth in the morning, at noon, and at bedtime. Tapers dose while  bleeding     omeprazole (PRILOSEC) 20 MG capsule Take 20 mg by mouth daily.     pramipexole (MIRAPEX) 1.5 MG tablet Take 1.5 mg by mouth at bedtime.     No current facility-administered medications on file prior to visit.   Past Medical History:  Diagnosis Date   Alcohol dependence ([place])    seldom drinks, maybe 2 beers in a two week period per pt on 03/12/22   Bipolar disorder ([place])    Follows w/ Dr. Reece Levy @ [facility] every 3 - 4 months.   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 Reece Levy, psychiartrist @ [facility].   Ovarian cyst    Panic attacks    pt states she has "seizure like symptoms" w/ panic attack, has had MRI and neuro r/o seizures/epilepsy   Persistent vomiting 03/07/2019   PTSD (post-traumatic stress disorder)    Restless leg syndrome    Pt stated that her RLS is caused by a psychiatric medicine that she takes.   Wears contact lenses    Wears glasses    Allergies  Allergen Reactions   Covid-19 (Mrna Bivalent) Vaccine Therapist, music) [Covid-19 (Mrna) Vaccine] Anaphylaxis   Rocephin [Ceftriaxone Sodium In Dextrose] Anaphylaxis   Social History   Socioeconomic History   Marital status: Single    Spouse name: Not on file   Number of children: Not on file   Years of education: Not on file   Highest education level: Associate degree: occupational, Hotel manager, or vocational program  Occupational History   Occupation: unemployed  Tobacco Use   Smoking status: Every Day    Packs/day: 0.25    Years: 23.00    Total pack years: 5.75    Types: Cigarettes   Smokeless tobacco: Never   Tobacco comments:    tobacco info given  Vaping Use   Vaping Use: Former  Substance and Sexual Activity   Alcohol use: Yes    Comment: about 2 - 3 beers per month   Drug use: Yes    Types: Marijuana    Comment: about 2 times per week per pt on 03/12/2022   Sexual activity: Not on file  Other Topics Concern   Not on file  Social History Narrative   Not on file   Social Determinants of Health   Financial Resource Strain: Medium Risk (03/12/2022)   Overall Financial Resource Strain (CARDIA)    Difficulty of Paying Living Expenses: Somewhat hard  Food Insecurity: Unknown (03/12/2022)   Hunger Vital Sign    Worried About Running Out of Food in the Last Year:  Patient refused    [place] in the Last Year: Patient refused  Transportation Needs: No Transportation Needs (03/12/2022)   PRAPARE - Hydrologist (Medical): No    Lack of Transportation (Non-Medical): No  Physical Activity: Insufficiently Active (03/12/2022)   Exercise Vital Sign    Days of Exercise per Week: 2 days    Minutes of Exercise per Session: 30 min  Stress: Stress Concern Present (03/12/2022)   [place]  Feeling of Stress : To some extent  Social Connections: Unknown (03/12/2022)   Social Connection and Isolation Panel [NHANES]    Frequency of Communication with Friends and Family: More than three times a week    Frequency of Social Gatherings with Friends and Family: Three times a week    Attends Religious Services: Patient refused    Active Member of Clubs or Organizations: No    Attends Archivist Meetings: Not on file    Marital Status: Never married   Vitals:   03/13/22 1012  BP: 138/78  Pulse: 74  Resp: 12  Temp: (!) 97.5 F (36.4 C)  SpO2: 97%  Body mass index is 42.12 kg/m.  Physical Exam Vitals and nursing note reviewed.  Constitutional:      General: She is not in acute distress.    Appearance: She is well-developed.  HENT:     Head: Normocephalic and atraumatic.     Mouth/Throat:     Mouth: Mucous membranes are moist.     Pharynx: Oropharynx is clear.  Eyes:     Conjunctiva/sclera: Conjunctivae normal.  Cardiovascular:     Rate and Rhythm: Normal rate and regular rhythm.     Heart sounds: No murmur heard.    Comments: DP pulses palpable  Pulmonary:     Effort: Pulmonary effort is normal. No respiratory distress.     Breath sounds: Normal breath sounds.  Abdominal:     Palpations: Abdomen is soft. There is no hepatomegaly or mass.     Tenderness: There is no abdominal tenderness.  Lymphadenopathy:     Cervical: No cervical  adenopathy.  Skin:    General: Skin is warm.     Findings: No erythema or rash.  Neurological:     General: No focal deficit present.     Mental Status: She is alert and oriented to person, place, and time.     Cranial Nerves: No cranial nerve deficit.     Gait: Gait normal.   ASSESSMENT AND PLAN:  Ms.Elisabella was seen today for pre-op exam.  Diagnoses and all orders for this visit: Orders Placed This Encounter  Procedures   Hepatic function panel   VITAMIN D 25 Hydroxy (Vit-D Deficiency, Fractures)   Lab Results  Component Value Date   ALT 10 03/13/2022   AST 17 03/13/2022   ALKPHOS 57 03/13/2022   BILITOT 0.5 03/13/2022   Preop examination Total laparoscopic hysterectomy with salpingectomy on 04/02/22 due to DUB. Low Gupta perioperative risk and cardiac risk. Tobacco cessation before surgery strongly recommended. DVT prophylaxis with early ambulation. Instructed to stop any NSAID/Asprin and OTC supplements at least a week before surgery. She is having labs at the hospital a few days before surgery. Cleared to proceed with surgery, pre op form completed and ready to be faxed to Dr. Gareth Eagle office.  Elevated transaminase level Mild. Continue limiting alcohol intake. Further recommendations according to LFT's.  Iron deficiency anemia due to chronic blood loss - menstrual Due to DUB. S/P iron infusion x 2.  Vitamin D deficiency, unspecified Further recommendations according to 51 OH vit D result.  Return if symptoms worsen or fail to improve.  Latondra Gebhart G. [place], MD  [facility]. [place] office.

## 2022-03-12 ENCOUNTER — Other Ambulatory Visit: Payer: Self-pay

## 2022-03-12 NOTE — Progress Notes (Signed)
Spoke w/ via phone for pre-op interview---[facility] Lab needs dos---- serum pregnancy              Lab results------03/30/22 lab appt for cbc, bmp, type & screen COVID test -----patient states asymptomatic no test needed Arrive at -------0530 on Thursday, 04/02/22 NPO after MN NO Solid Food.  Clear liquids from MN until---0430 Med rec completed Medications to take morning of surgery -----Omeprazole, Xanax prn, Buspar  Diabetic medication -----n/a Patient instructed no nail polish to be worn day of surgery Patient instructed to bring photo id and insurance card day of surgery Patient aware to have Driver (ride ) / caregiver    for 24 hours after surgery - mother - Rhaeta Patient Special Instructions -----Extended / overnight stay instructions given. Pre-Op special Istructions -----none Patient verbalized understanding of instructions that were given at this phone interview. Patient denies shortness of breath, chest pain, fever, cough at this phone interview.   History was updated on 12/18/21 by Lyndel Pleasure, RN. Per patient, there have been no changes in her medical history or medications since then.

## 2022-03-12 NOTE — Progress Notes (Signed)
Your procedure is scheduled on Thursday, 04/02/22.  Report to [facility]   Call this number if you have problems the morning of surgery  :[phone].   OUR ADDRESS IS [place].  WE ARE LOCATED IN [facility].  PLEASE BRING YOUR INSURANCE CARD AND PHOTO ID DAY OF SURGERY.  ONLY 2 PEOPLE ARE ALLOWED IN  WAITING  ROOM.                                      REMEMBER:  DO NOT EAT FOOD, CANDY GUM OR MINTS  AFTER MIDNIGHT THE NIGHT BEFORE YOUR SURGERY . YOU MAY HAVE CLEAR LIQUIDS FROM MIDNIGHT THE NIGHT BEFORE YOUR SURGERY UNTIL  4:30 AM . NO CLEAR LIQUIDS AFTER   4:30 AM DAY OF SURGERY.  YOU MAY  BRUSH YOUR TEETH MORNING OF SURGERY AND RINSE YOUR MOUTH OUT, NO CHEWING GUM CANDY OR MINTS.     CLEAR LIQUID DIET   Foods Allowed                                                                     Foods Excluded  Coffee and tea, regular and decaf                             liquids that you cannot  Plain Jell-O                                                                   see through such as: Fruit ices (not with fruit pulp)                                     milk, soups, orange juice  Plain  Popsicles                                    All solid food Carbonated beverages, regular and diet                                    Cranberry, grape and apple juices Sports drinks like Gatorade _____________________________________________________________________     TAKE ONLY THESE MEDICATIONS MORNING OF SURGERY: Xanax if needed, Buspar & Omeprazole    UP TO 4 VISITORS  MAY VISIT IN THE EXTENDED RECOVERY ROOM UNTIL 800 PM ONLY.  ONE  VISITOR AGE 76 AND OVER MAY SPEND THE NIGHT AND MUST BE IN EXTENDED RECOVERY ROOM NO LATER THAN 800 PM . YOUR DISCHARGE TIME AFTER YOU SPEND THE NIGHT IS 900 AM THE MORNING AFTER YOUR SURGERY.  YOU MAY PACK A SMALL OVERNIGHT BAG  WITH TOILETRIES FOR YOUR OVERNIGHT STAY IF YOU WISH.  YOUR  PRESCRIPTION MEDICATIONS WILL BE PROVIDED DURING [place].                                      DO NOT WEAR JEWERLY, MAKE UP. DO NOT WEAR LOTIONS, POWDERS, PERFUMES OR NAIL POLISH ON YOUR FINGERNAILS. TOENAIL POLISH IS OK TO WEAR. DO NOT SHAVE FOR 48 HOURS PRIOR TO DAY OF SURGERY. MEN MAY SHAVE FACE AND NECK. CONTACTS, GLASSES, OR DENTURES MAY NOT BE WORN TO SURGERY.  REMEMBER: NO SMOKING, DRUGS OR ALCOHOL FOR 24 HOURS BEFORE YOUR SURGERY.                                    [facility] IS NOT RESPONSIBLE  FOR ANY BELONGINGS.                                                                    Marland Kitchen           [facility] - Preparing for Surgery Before surgery, you can play an important role.  Because skin is not sterile, your skin needs to be as free of germs as possible.  You can reduce the number of germs on your skin by washing with CHG (chlorahexidine gluconate) soap before surgery.  CHG is an antiseptic cleaner which kills germs and bonds with the skin to continue killing germs even after washing. Please DO NOT use if you have an allergy to CHG or antibacterial soaps.  If your skin becomes reddened/irritated stop using the CHG and inform your nurse when you arrive at Short Stay. Do not shave (including legs and underarms) for at least 48 hours prior to the first CHG shower.  You may shave your face/neck. Please follow these instructions carefully:  1.  Shower with CHG Soap the night before surgery and the  morning of Surgery.  2.  If you choose to wash your hair, wash your hair first as usual with your  normal  shampoo.  3.  After you shampoo, rinse your hair and body thoroughly to remove the  shampoo.                                        4.  Use CHG as you would any other liquid soap.  You can apply chg directly  to the skin and wash , chg soap provided, night before and morning of your surgery.  5.  Apply the CHG Soap to your body ONLY FROM THE NECK DOWN.   Do not use on face/ open                            Wound or open sores. Avoid contact with eyes, ears mouth and genitals (private parts).                       Wash face,  Development worker, international aid (private parts)  with your normal soap.             6.  Wash thoroughly, paying special attention to the area where your surgery  will be performed.  7.  Thoroughly rinse your body with warm water from the neck down.  8.  DO NOT shower/wash with your normal soap after using and rinsing off  the CHG Soap.             9.  Pat yourself dry with a clean towel.            10.  Wear clean pajamas.            11.  Place clean sheets on your bed the night of your first shower and do not  sleep with pets. Day of Surgery : Do not apply any lotions/deodorants the morning of surgery.  Please wear clean clothes to the hospital/surgery center.  IF YOU HAVE ANY SKIN IRRITATION OR PROBLEMS WITH THE SURGICAL SOAP, PLEASE GET A BAR OF GOLD DIAL SOAP AND SHOWER THE NIGHT BEFORE YOUR SURGERY AND THE MORNING OF YOUR SURGERY. PLEASE LET THE NURSE KNOW MORNING OF YOUR SURGERY IF YOU HAD ANY PROBLEMS WITH THE SURGICAL SOAP.   ________________________________________________________________________                                                        QUESTIONS [place] [facility] PRE OP NURSE PHONE [phone].

## 2022-03-13 ENCOUNTER — Encounter: Payer: Self-pay | Admitting: Family Medicine

## 2022-03-13 ENCOUNTER — Ambulatory Visit (INDEPENDENT_AMBULATORY_CARE_PROVIDER_SITE_OTHER): Payer: Commercial Managed Care - HMO | Admitting: Family Medicine

## 2022-03-13 VITALS — BP 138/78 | HR 74 | Temp 97.5°F | Resp 12 | Ht 68.5 in | Wt 281.1 lb

## 2022-03-13 DIAGNOSIS — E559 Vitamin D deficiency, unspecified: Secondary | ICD-10-CM

## 2022-03-13 DIAGNOSIS — Z01818 Encounter for other preprocedural examination: Secondary | ICD-10-CM

## 2022-03-13 DIAGNOSIS — R7401 Elevation of levels of liver transaminase levels: Secondary | ICD-10-CM | POA: Diagnosis not present

## 2022-03-13 DIAGNOSIS — D500 Iron deficiency anemia secondary to blood loss (chronic): Secondary | ICD-10-CM

## 2022-03-13 LAB — HEPATIC FUNCTION PANEL
ALT: 10 U/L (ref 0–35)
AST: 17 U/L (ref 0–37)
Albumin: 4.5 g/dL (ref 3.5–5.2)
Alkaline Phosphatase: 57 U/L (ref 39–117)
Bilirubin, Direct: 0.1 mg/dL (ref 0.0–0.3)
Total Bilirubin: 0.5 mg/dL (ref 0.2–1.2)
Total Protein: 7.5 g/dL (ref 6.0–8.3)

## 2022-03-13 LAB — VITAMIN D 25 HYDROXY (VIT D DEFICIENCY, FRACTURES): VITD: 22.27 ng/mL — ABNORMAL LOW (ref 30.00–100.00)

## 2022-03-13 NOTE — Patient Instructions (Signed)
A few things to remember from today's visit:  Preop examination  Elevated transaminase level - Plan: Hepatic function panel  I will complete form and fax it with my note to surgeon. Stop all over the counter supplements and avoid aspirin,Ibuprofen,Naproxen at least a week before surgery. Just Tylenol for pain. You can take all your daily medications after you wake up and clear to resume oral intake. Start walking as soon as you are instructed to do so for blood clots prevention. Stop smoking before and during recovery from surgery.  Pain meds can cause constipation, so have Miralax in case this happen.  You are having labs at the hospital a few days before and will meet with surgeon.  If you need refills for medications you take chronically, please call your pharmacy. Do not use My Chart to request refills or for acute issues that need immediate attention. If you send a my chart message, it may take a few days to be addressed, specially if I am not in the office.  Please be sure medication list is accurate. If a new problem present, please set up appointment sooner than planned today.

## 2022-03-30 ENCOUNTER — Encounter (HOSPITAL_COMMUNITY)
Admission: RE | Admit: 2022-03-30 | Discharge: 2022-03-30 | Disposition: A | Discharge status: Home / Self Care | Payer: Commercial Managed Care - HMO | Source: Ambulatory Visit | Attending: Obstetrics and Gynecology | Admitting: Obstetrics and Gynecology

## 2022-03-30 ENCOUNTER — Other Ambulatory Visit: Payer: Self-pay

## 2022-03-30 VITALS — BP 161/117 | HR 92 | Temp 98.0°F | Resp 16 | Ht 69.0 in | Wt 273.0 lb

## 2022-03-30 DIAGNOSIS — Z01818 Encounter for other preprocedural examination: Secondary | ICD-10-CM | POA: Insufficient documentation

## 2022-03-30 LAB — BASIC METABOLIC PANEL
Anion gap: 5 (ref 5–15)
BUN: 14 mg/dL (ref 6–20)
CO2: 24 mmol/L (ref 22–32)
Calcium: 8.7 mg/dL — ABNORMAL LOW (ref 8.9–10.3)
Chloride: 111 mmol/L (ref 98–111)
Creatinine, Ser: 0.92 mg/dL (ref 0.44–1.00)
GFR, Estimated: >60 mL/min (ref >60)
Glucose, Bld: 119 mg/dL — ABNORMAL HIGH (ref 70–99)
Potassium: 4.6 mmol/L (ref 3.5–5.1)
Sodium: 140 mmol/L (ref 135–145)

## 2022-03-30 LAB — CBC
HCT: 35.8 % — ABNORMAL LOW (ref 36.0–46.0)
Hemoglobin: 10.6 g/dL — ABNORMAL LOW (ref 12.0–15.0)
MCH: 21.9 pg — ABNORMAL LOW (ref 26.0–34.0)
MCHC: 29.6 g/dL — ABNORMAL LOW (ref 30.0–36.0)
MCV: 73.8 fL — ABNORMAL LOW (ref 80.0–100.0)
Platelets: 411 10*3/uL — ABNORMAL HIGH (ref 150–400)
RBC: 4.85 MIL/uL (ref 3.87–5.11)
RDW: 17.5 % — ABNORMAL HIGH (ref 11.5–15.5)
WBC: 8.5 10*3/uL (ref 4.0–10.5)
nRBC: 0 % (ref 0.0–0.2)

## 2022-03-30 NOTE — Progress Notes (Signed)
Pt came today for PAT lab appt for surgery on 04-02-2022.  Vital signs done, right arm bp 161/ 117 and left arm 168/103.  Pt told lab tech she just got off night shift and walk up the hill to appt and anxious but denied symptoms.  Jessica APP at PAT spoke w/ pt and ordered ekg. Janett Billow told pt to go home and check bp and call back.  Pt called back and stated bp at home 128/ 91.  Pt denies any dx hypertension or taking any bp medication.  Pt stated this happens when she over exerts herself and over worked.  Noted in epic pt had seen her pcp 03-13-2022 for medical clearance for surgery her bp 138/ 78.  Advised pt to continue checking her bp at home of high notify her pcp, pt verbalized understanding.  Final cofirmed EKG NSR 94 with no significant change was found.

## 2022-04-01 MED ORDER — VANCOMYCIN HCL 1500 MG/300ML IV SOLN
1500.0000 mg | Freq: Once | INTRAVENOUS | Status: AC
  Administered 2022-04-02: 1500 mg via INTRAVENOUS
  Filled 2022-04-01 (×3): qty 300

## 2022-04-01 MED ORDER — GENTAMICIN SULFATE 40 MG/ML IJ SOLN
5.0000 mg/kg | INTRAVENOUS | Status: AC
  Administered 2022-04-02: 440 mg via INTRAVENOUS
  Filled 2022-04-01 (×2): qty 11

## 2022-04-01 NOTE — Anesthesia Preprocedure Evaluation (Addendum)
Anesthesia Evaluation  Patient identified by MRN, date of birth, ID band Patient awake    Reviewed: Allergy & Precautions, NPO status , Patient's Chart, lab work & pertinent test results  Airway Mallampati: III  TM Distance: >3 FB Neck ROM: Full    Dental no notable dental hx.    Pulmonary Current Smoker and Patient abstained from smoking.,    Pulmonary exam normal        Cardiovascular negative cardio ROS Normal cardiovascular exam     Neuro/Psych PSYCHIATRIC DISORDERS Anxiety Bipolar Disorder negative neurological ROS     GI/Hepatic Neg liver ROS, GERD  Medicated and Controlled,  Endo/Other  Morbid obesity  Renal/GU negative Renal ROS     Musculoskeletal negative musculoskeletal ROS (+)   Abdominal   Peds  Hematology  (+) Blood dyscrasia, anemia ,   Anesthesia Other Findings Menorrhagia to Anemia  Failed Medical Management  Reproductive/Obstetrics                            Anesthesia Physical Anesthesia Plan  ASA: 3  Anesthesia Plan: General   Post-op Pain Management:    Induction: Intravenous  PONV Risk Score and Plan: 3 and Ondansetron, Dexamethasone, Propofol infusion, Midazolam and Treatment may vary due to age or medical condition  Airway Management Planned: Oral ETT  Additional Equipment:   Intra-op Plan:   Post-operative Plan: Extubation in OR  Informed Consent: I have reviewed the patients History and Physical, chart, labs and discussed the procedure including the risks, benefits and alternatives for the proposed anesthesia with the patient or authorized representative who has indicated his/her understanding and acceptance.     Dental advisory given  Plan Discussed with: CRNA  Anesthesia Plan Comments:        Anesthesia Quick Evaluation

## 2022-04-02 ENCOUNTER — Ambulatory Visit (HOSPITAL_BASED_OUTPATIENT_CLINIC_OR_DEPARTMENT_OTHER)
Admission: RE | Admit: 2022-04-02 | Discharge: 2022-04-02 | Disposition: A | Discharge status: Home / Self Care | Payer: Commercial Managed Care - HMO | Attending: Obstetrics and Gynecology | Admitting: Obstetrics and Gynecology

## 2022-04-02 ENCOUNTER — Encounter (HOSPITAL_BASED_OUTPATIENT_CLINIC_OR_DEPARTMENT_OTHER)
Admission: RE | Disposition: A | Discharge status: Home / Self Care | Payer: Self-pay | Source: Home / Self Care | Attending: Obstetrics and Gynecology

## 2022-04-02 ENCOUNTER — Ambulatory Visit (HOSPITAL_BASED_OUTPATIENT_CLINIC_OR_DEPARTMENT_OTHER): Payer: Commercial Managed Care - HMO | Admitting: Anesthesiology

## 2022-04-02 ENCOUNTER — Other Ambulatory Visit: Payer: Self-pay

## 2022-04-02 ENCOUNTER — Encounter (HOSPITAL_BASED_OUTPATIENT_CLINIC_OR_DEPARTMENT_OTHER): Payer: Self-pay | Admitting: Obstetrics and Gynecology

## 2022-04-02 DIAGNOSIS — N920 Excessive and frequent menstruation with regular cycle: Secondary | ICD-10-CM

## 2022-04-02 DIAGNOSIS — N72 Inflammatory disease of cervix uteri: Secondary | ICD-10-CM | POA: Diagnosis not present

## 2022-04-02 DIAGNOSIS — F310 Bipolar disorder, current episode hypomanic: Secondary | ICD-10-CM

## 2022-04-02 DIAGNOSIS — F17210 Nicotine dependence, cigarettes, uncomplicated: Secondary | ICD-10-CM | POA: Insufficient documentation

## 2022-04-02 DIAGNOSIS — N879 Dysplasia of cervix uteri, unspecified: Secondary | ICD-10-CM | POA: Insufficient documentation

## 2022-04-02 DIAGNOSIS — F419 Anxiety disorder, unspecified: Secondary | ICD-10-CM | POA: Insufficient documentation

## 2022-04-02 DIAGNOSIS — F319 Bipolar disorder, unspecified: Secondary | ICD-10-CM | POA: Insufficient documentation

## 2022-04-02 DIAGNOSIS — K219 Gastro-esophageal reflux disease without esophagitis: Secondary | ICD-10-CM | POA: Insufficient documentation

## 2022-04-02 DIAGNOSIS — D649 Anemia, unspecified: Secondary | ICD-10-CM | POA: Insufficient documentation

## 2022-04-02 DIAGNOSIS — D500 Iron deficiency anemia secondary to blood loss (chronic): Secondary | ICD-10-CM | POA: Insufficient documentation

## 2022-04-02 DIAGNOSIS — Z6841 Body Mass Index (BMI) 40.0 and over, adult: Secondary | ICD-10-CM | POA: Diagnosis not present

## 2022-04-02 DIAGNOSIS — Z01818 Encounter for other preprocedural examination: Secondary | ICD-10-CM

## 2022-04-02 HISTORY — DX: Presence of spectacles and contact lenses: Z97.3

## 2022-04-02 HISTORY — PX: TOTAL LAPAROSCOPIC HYSTERECTOMY WITH SALPINGECTOMY: SHX6742

## 2022-04-02 HISTORY — DX: Gastro-esophageal reflux disease without esophagitis: K21.9

## 2022-04-02 LAB — HCG, SERUM, QUALITATIVE: Preg, Serum: NEGATIVE

## 2022-04-02 LAB — TYPE AND SCREEN
ABO/RH(D): O POS
Antibody Screen: NEGATIVE

## 2022-04-02 SURGERY — HYSTERECTOMY, TOTAL, LAPAROSCOPIC, WITH SALPINGECTOMY
Anesthesia: General | Site: Abdomen | Laterality: Bilateral

## 2022-04-02 MED ORDER — FENTANYL CITRATE (PF) 100 MCG/2ML IJ SOLN
25.0000 ug | INTRAMUSCULAR | Status: DC | PRN
  Administered 2022-04-02 (×2): 50 ug via INTRAVENOUS

## 2022-04-02 MED ORDER — ACETAMINOPHEN 500 MG PO TABS
ORAL_TABLET | ORAL | Status: AC
  Filled 2022-04-02: qty 2

## 2022-04-02 MED ORDER — OXYCODONE HCL 5 MG PO TABS
ORAL_TABLET | ORAL | Status: AC
  Filled 2022-04-02: qty 1

## 2022-04-02 MED ORDER — PROPOFOL 10 MG/ML IV BOLUS
INTRAVENOUS | Status: AC
  Filled 2022-04-02: qty 20

## 2022-04-02 MED ORDER — KETOROLAC TROMETHAMINE 30 MG/ML IJ SOLN
INTRAMUSCULAR | Status: AC
  Filled 2022-04-02: qty 1

## 2022-04-02 MED ORDER — MIDAZOLAM HCL 2 MG/2ML IJ SOLN
INTRAMUSCULAR | Status: AC
  Filled 2022-04-02: qty 2

## 2022-04-02 MED ORDER — KETOROLAC TROMETHAMINE 30 MG/ML IJ SOLN: 30.0000 mg | Freq: Once | INTRAMUSCULAR | Status: DC | PRN

## 2022-04-02 MED ORDER — IBUPROFEN 800 MG PO TABS: 800.0000 mg | ORAL_TABLET | Freq: Three times a day (TID) | ORAL | 0 refills | Status: AC | PRN

## 2022-04-02 MED ORDER — BUPIVACAINE HCL (PF) 0.25 % IJ SOLN
INTRAMUSCULAR | Status: DC | PRN
  Administered 2022-04-02: 30 mL

## 2022-04-02 MED ORDER — MIDAZOLAM HCL 2 MG/2ML IJ SOLN
INTRAMUSCULAR | Status: DC | PRN
  Administered 2022-04-02: 2 mg via INTRAVENOUS

## 2022-04-02 MED ORDER — DEXMEDETOMIDINE HCL IN NACL 200 MCG/50ML IV SOLN
INTRAVENOUS | Status: DC | PRN
  Administered 2022-04-02 (×2): 4 ug via INTRAVENOUS
  Administered 2022-04-02 (×2): 8 ug via INTRAVENOUS

## 2022-04-02 MED ORDER — 0.9 % SODIUM CHLORIDE (POUR BTL) OPTIME
TOPICAL | Status: DC | PRN
  Administered 2022-04-02: 500 mL

## 2022-04-02 MED ORDER — KETOROLAC TROMETHAMINE 30 MG/ML IJ SOLN
INTRAMUSCULAR | Status: DC | PRN
  Administered 2022-04-02: 30 mg via INTRAVENOUS

## 2022-04-02 MED ORDER — PROPOFOL 500 MG/50ML IV EMUL
INTRAVENOUS | Status: DC | PRN
  Administered 2022-04-02: 50 ug/kg/min via INTRAVENOUS

## 2022-04-02 MED ORDER — KETOROLAC TROMETHAMINE 30 MG/ML IJ SOLN
30.0000 mg | Freq: Four times a day (QID) | INTRAMUSCULAR | Status: DC
  Administered 2022-04-02: 30 mg via INTRAVENOUS

## 2022-04-02 MED ORDER — LACTATED RINGERS IV SOLN: INTRAVENOUS | Status: DC

## 2022-04-02 MED ORDER — SUGAMMADEX SODIUM 200 MG/2ML IV SOLN
INTRAVENOUS | Status: DC | PRN
  Administered 2022-04-02: 250 mg via INTRAVENOUS

## 2022-04-02 MED ORDER — ACETAMINOPHEN 500 MG PO TABS
1000.0000 mg | ORAL_TABLET | Freq: Once | ORAL | Status: AC
  Administered 2022-04-02: 1000 mg via ORAL

## 2022-04-02 MED ORDER — AMISULPRIDE (ANTIEMETIC) 5 MG/2ML IV SOLN: 10.0000 mg | Freq: Once | INTRAVENOUS | Status: DC | PRN

## 2022-04-02 MED ORDER — PANTOPRAZOLE SODIUM 40 MG PO TBEC
40.0000 mg | DELAYED_RELEASE_TABLET | Freq: Every day | ORAL | Status: DC
  Administered 2022-04-02: 40 mg via ORAL

## 2022-04-02 MED ORDER — FENTANYL CITRATE (PF) 100 MCG/2ML IJ SOLN
INTRAMUSCULAR | Status: AC
  Filled 2022-04-02: qty 2

## 2022-04-02 MED ORDER — ONDANSETRON HCL 4 MG/2ML IJ SOLN
INTRAMUSCULAR | Status: DC | PRN
  Administered 2022-04-02: 4 mg via INTRAVENOUS

## 2022-04-02 MED ORDER — OXYCODONE HCL 5 MG PO TABS
5.0000 mg | ORAL_TABLET | ORAL | Status: DC | PRN
  Administered 2022-04-02 (×2): 5 mg via ORAL

## 2022-04-02 MED ORDER — LIDOCAINE HCL (CARDIAC) PF 100 MG/5ML IV SOSY
PREFILLED_SYRINGE | INTRAVENOUS | Status: DC | PRN
  Administered 2022-04-02: 60 mg via INTRAVENOUS

## 2022-04-02 MED ORDER — ONDANSETRON HCL 4 MG PO TABS: 4.0000 mg | ORAL_TABLET | Freq: Four times a day (QID) | ORAL | Status: DC | PRN

## 2022-04-02 MED ORDER — OXYCODONE HCL 5 MG PO TABS: 5.0000 mg | ORAL_TABLET | Freq: Once | ORAL | Status: DC | PRN

## 2022-04-02 MED ORDER — OXYCODONE HCL 5 MG PO TABS: 5.0000 mg | ORAL_TABLET | ORAL | 0 refills | Status: AC | PRN

## 2022-04-02 MED ORDER — PROMETHAZINE HCL 25 MG/ML IJ SOLN: 6.2500 mg | INTRAMUSCULAR | Status: DC | PRN

## 2022-04-02 MED ORDER — ONDANSETRON HCL 4 MG/2ML IJ SOLN: 4.0000 mg | Freq: Four times a day (QID) | INTRAMUSCULAR | Status: DC | PRN

## 2022-04-02 MED ORDER — DEXAMETHASONE SODIUM PHOSPHATE 4 MG/ML IJ SOLN
INTRAMUSCULAR | Status: DC | PRN
  Administered 2022-04-02: 8 mg via INTRAVENOUS

## 2022-04-02 MED ORDER — PROPOFOL 10 MG/ML IV BOLUS
INTRAVENOUS | Status: DC | PRN
  Administered 2022-04-02: 200 mg via INTRAVENOUS

## 2022-04-02 MED ORDER — ACETAMINOPHEN 500 MG PO TABS
1000.0000 mg | ORAL_TABLET | Freq: Four times a day (QID) | ORAL | Status: DC
  Administered 2022-04-02 (×2): 1000 mg via ORAL

## 2022-04-02 MED ORDER — SODIUM CHLORIDE 0.9 % IR SOLN
Status: DC | PRN
  Administered 2022-04-02: 1000 mL

## 2022-04-02 MED ORDER — FENTANYL CITRATE (PF) 100 MCG/2ML IJ SOLN
INTRAMUSCULAR | Status: DC | PRN
  Administered 2022-04-02 (×4): 50 ug via INTRAVENOUS
  Administered 2022-04-02: 25 ug via INTRAVENOUS
  Administered 2022-04-02 (×3): 50 ug via INTRAVENOUS
  Administered 2022-04-02: 25 ug via INTRAVENOUS

## 2022-04-02 MED ORDER — ROCURONIUM BROMIDE 100 MG/10ML IV SOLN
INTRAVENOUS | Status: DC | PRN
  Administered 2022-04-02: 60 mg via INTRAVENOUS
  Administered 2022-04-02 (×2): 10 mg via INTRAVENOUS
  Administered 2022-04-02: 20 mg via INTRAVENOUS

## 2022-04-02 MED ORDER — IBUPROFEN 200 MG PO TABS: 600.0000 mg | ORAL_TABLET | Freq: Four times a day (QID) | ORAL | Status: DC

## 2022-04-02 MED ORDER — POVIDONE-IODINE 10 % EX SWAB: 2.0000 | Freq: Once | CUTANEOUS | Status: DC

## 2022-04-02 MED ORDER — OXYCODONE HCL 5 MG/5ML PO SOLN: 5.0000 mg | Freq: Once | ORAL | Status: DC | PRN

## 2022-04-02 MED ORDER — PANTOPRAZOLE SODIUM 40 MG PO TBEC
DELAYED_RELEASE_TABLET | ORAL | Status: AC
  Filled 2022-04-02: qty 1

## 2022-04-02 SURGICAL SUPPLY — 55 items
ADH SKN CLS APL DERMABOND .7 (GAUZE/BANDAGES/DRESSINGS) ×1
APL SRG 38 LTWT LNG FL B (MISCELLANEOUS)
APPLICATOR ARISTA FLEXITIP XL (MISCELLANEOUS) IMPLANT
CABLE HIGH FREQUENCY MONO STRZ (ELECTRODE) IMPLANT
COVER MAYO STAND STRL (DRAPES) ×1 IMPLANT
DERMABOND ADVANCED .7 DNX12 (GAUZE/BANDAGES/DRESSINGS) ×1 IMPLANT
DEVICE SUTURE ENDOST 10MM (ENDOMECHANICALS) IMPLANT
DRSG COVADERM PLUS 2X2 (GAUZE/BANDAGES/DRESSINGS) IMPLANT
DURAPREP 26ML APPLICATOR (WOUND CARE) ×1 IMPLANT
ENDOSTITCH 0 SINGLE 48 (SUTURE) IMPLANT
FILTER SMOKE EVAC LAPAROSHD (FILTER) ×1 IMPLANT
GLOVE BIOGEL PI IND STRL 7.0 (GLOVE) ×4 IMPLANT
GLOVE ECLIPSE 6.5 STRL STRAW (GLOVE) ×2 IMPLANT
GOWN STRL REUS W/ TWL LRG LVL3 (GOWN DISPOSABLE) ×3 IMPLANT
GOWN STRL REUS W/TWL LRG LVL3 (GOWN DISPOSABLE) ×3
HEMOSTAT ARISTA ABSORB 3G PWDR (HEMOSTASIS) IMPLANT
HIBICLENS CHG 4% 4OZ BTL (MISCELLANEOUS) ×1 IMPLANT
IRRIG SUCT STRYKERFLOW 2 WTIP (MISCELLANEOUS) ×1
IRRIGATION SUCT STRKRFLW 2 WTP (MISCELLANEOUS) ×1 IMPLANT
KIT TURNOVER CYSTO (KITS) ×1 IMPLANT
NS IRRIG 1000ML POUR BTL (IV SOLUTION) ×1 IMPLANT
OCCLUDER COLPOPNEUMO (BALLOONS) ×1 IMPLANT
PACK LAPAROSCOPY BASIN (CUSTOM PROCEDURE TRAY) ×1 IMPLANT
PACK TRENDGUARD 450 HYBRID PRO (MISCELLANEOUS) IMPLANT
PROTECTOR NERVE ULNAR (MISCELLANEOUS) ×2 IMPLANT
SCISSORS LAP 5X35 DISP (ENDOMECHANICALS) IMPLANT
SET CYSTO W/LG BORE CLAMP LF (SET/KITS/TRAYS/PACK) IMPLANT
SET TRI-LUMEN FLTR TB AIRSEAL (TUBING) ×1 IMPLANT
SET TUBE SMOKE EVAC HIGH FLOW (TUBING) ×1 IMPLANT
SHEARS HARMONIC ACE PLUS 36CM (ENDOMECHANICALS) ×1 IMPLANT
SLEEVE ADV FIXATION 5X100MM (TROCAR) ×1 IMPLANT
SOLUTION ELECTROLUBE (MISCELLANEOUS) ×1 IMPLANT
SUT ENDO VLOC 180-0-8IN (SUTURE) IMPLANT
SUT VIC AB 0 CT1 27 (SUTURE) ×1
SUT VIC AB 0 CT1 27XBRD ANTBC (SUTURE) IMPLANT
SUT VIC AB 4-0 PS2 27 (SUTURE) IMPLANT
SUT VICRYL 0 UR6 27IN ABS (SUTURE) ×1 IMPLANT
SUT VICRYL 4-0 PS2 18IN ABS (SUTURE) IMPLANT
SUT VLOC 180 0 9IN  GS21 (SUTURE)
SUT VLOC 180 0 9IN GS21 (SUTURE) IMPLANT
SYR 10ML LL (SYRINGE) IMPLANT
SYR 50ML LL SCALE MARK (SYRINGE) ×1 IMPLANT
SYSTEM CARTER THOMASON II (TROCAR) IMPLANT
TIP UTERINE 5.1X6CM LAV DISP (MISCELLANEOUS) IMPLANT
TIP UTERINE 6.7X10CM GRN DISP (MISCELLANEOUS) IMPLANT
TIP UTERINE 6.7X6CM WHT DISP (MISCELLANEOUS) IMPLANT
TIP UTERINE 6.7X8CM BLUE DISP (MISCELLANEOUS) IMPLANT
TOWEL OR 17X26 10 PK STRL BLUE (TOWEL DISPOSABLE) ×2 IMPLANT
TRAY FOLEY W/BAG SLVR 14FR (SET/KITS/TRAYS/PACK) ×1 IMPLANT
TRENDGUARD 450 HYBRID PRO PACK (MISCELLANEOUS) ×1
TROCAR PORT AIRSEAL 5X120 (TROCAR) ×1 IMPLANT
TROCAR Z-THREAD FIOS 11X100 BL (TROCAR) ×1 IMPLANT
TROCAR Z-THREAD FIOS 5X100MM (TROCAR) ×1 IMPLANT
UNDERPAD 30X36 HEAVY ABSORB (UNDERPADS AND DIAPERS) ×1 IMPLANT
WARMER LAPAROSCOPE (MISCELLANEOUS) ×1 IMPLANT

## 2022-04-02 NOTE — Anesthesia Postprocedure Evaluation (Signed)
Anesthesia Post Note  Patient: Evelyn Lopez  Procedure(s) Performed: TOTAL LAPAROSCOPIC HYSTERECTOMY WITH BILATERAL SALPINGECTOMY (Bilateral: Abdomen)     Patient location during evaluation: PACU Anesthesia Type: General Level of consciousness: awake Pain management: pain level controlled Vital Signs Assessment: post-procedure vital signs reviewed and stable Respiratory status: spontaneous breathing, nonlabored ventilation, respiratory function stable and patient connected to nasal cannula oxygen Cardiovascular status: blood pressure returned to baseline and stable Postop Assessment: no apparent nausea or vomiting Anesthetic complications: no   No notable events documented.  Last Vitals:  Vitals:   04/02/22 1236 04/02/22 1334  BP: 123/76 114/76  Pulse: 60 61  Resp: 15 16  Temp: 36.6 C 36.4 C  SpO2: 97% 99%    Last Pain:  Vitals:   04/02/22 1342  TempSrc:   PainSc: 5                  Minnie Shi P Jori Frerichs

## 2022-04-02 NOTE — H&P (Addendum)
Evelyn Lopez is an 41 y.o. female. G3P0 here for TLH/bilateral salpingectomy.  She has menorhagia to anemia, failed medical management and wants definitive management.  She has had a recent d&C for suspected polyp with benign results.  Recent pap neg/neg hr hpv.  She is currently on provera to manage her bleeding.  Pertinent Gynecological History: Sab x2 Ectopic with ?right salpingectomy  Menstrual History:  No LMP recorded (lmp unknown). (Menstrual status: Irregular Periods).    Past Medical History:  Diagnosis Date   Alcohol dependence (HCC)    seldom drinks, maybe 2 beers in a two week period per pt on 03/12/22   Bipolar disorder (HCC)    Follows w/ Dr. Betti Cruz @ [facility] every 3 - 4 months.   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 Betti Cruz, psychiartrist @ [facility].   Ovarian cyst    Panic attacks    pt states she has "seizure like symptoms" w/ panic attack, has had MRI and neuro r/o seizures/epilepsy   Persistent vomiting 03/07/2019   Some improvement as of 03/2022   PTSD (post-traumatic stress disorder)    Restless leg syndrome    Pt stated that her RLS is caused by a psychiatric medicine that she takes.   Wears contact lenses    Wears glasses     Past Surgical History:  Procedure Laterality Date   COLONOSCOPY  10/14/2016   DILATATION & CURETTAGE/HYSTEROSCOPY WITH MYOSURE N/A 12/30/2021   Procedure: DILATATION & CURETTAGE/HYSTEROSCOPY WITH MYOSURE;  Surgeon: Vick Frees, MD;  Location: [facility];  Service: Gynecology;  Laterality: N/A;   ESOPHAGOGASTRODUODENOSCOPY  03/07/2019   ESOPHAGOGASTRODUODENOSCOPY (EGD) WITH PROPOFOL N/A  03/07/2019   Procedure: ESOPHAGOGASTRODUODENOSCOPY (EGD) WITH PROPOFOL;  Surgeon: Iva Boop, MD;  Location: [facility];  Service: Endoscopy;  Laterality: N/A;   LAPAROSCOPY  12/29/2011   Procedure: LAPAROSCOPY OPERATIVE;  Surgeon: Tresa Endo A. Ernestina Penna, MD;  Location: [facility];  Service: Gynecology;  Laterality: Bilateral;  Right Salpingectomy   SALPINGOOPHORECTOMY Right 2013   d/t ectopic pregnancy    Family History  Problem Relation Age of Onset   High blood pressure Mother    Irritable bowel syndrome Mother    Diabetes Father    Heart disease Father    High blood pressure Father    Other Father        fatty liver   Colon cancer Neg Hx    Esophageal cancer Neg Hx     Social History:  reports that she has been smoking cigarettes. She has a 5.75 pack-year smoking history. She has never used smokeless tobacco. She reports current alcohol use. She reports current drug use. Drug: Marijuana.  Allergies:  Allergies  Allergen Reactions   Covid-19 (Mrna Bivalent) Vaccine [employer]) [Covid-19 (Mrna) Vaccine] Anaphylaxis   Rocephin [Ceftriaxone Sodium In Dextrose] Anaphylaxis    Medications Prior to Admission  Medication Sig Dispense Refill Last Dose   ALPRAZolam (XANAX) 1 MG tablet Take 1 mg by mouth as needed. 4 times daily as needed.   04/02/2022 at 0000   benztropine (COGENTIN) 1 MG tablet Take 1 mg by mouth at bedtime.  04/01/2022   busPIRone (BUSPAR) 10 MG tablet Take 10 mg by mouth 2 (two) times daily. Morning & afternoon   04/01/2022   hydrOXYzine (VISTARIL) 50 MG capsule Take 50 mg by mouth 2 (two) times daily. In the afternoon & at bedtime.   04/01/2022   ibuprofen (ADVIL) 800 MG tablet Take 800 mg by mouth every 8 (eight) hours as needed for mild pain.   Past Week   omeprazole (PRILOSEC) 20 MG capsule Take 20 mg by mouth daily.   04/01/2022   pramipexole (MIRAPEX) 1.5 MG tablet Take 1.5 mg by mouth at bedtime.   04/01/2022   norethindrone (AYGESTIN) 5 MG tablet Take 5 mg by  mouth in the morning, at noon, and at bedtime. Tapers dose while bleeding   03/31/2022    ROS SOB/chest pain/ HA/ vision changes/ LE pain or swelling/ all neg  Blood pressure (!) 119/97, pulse (!) 57, temperature 97.7 F (36.5 C), temperature source Oral, resp. rate 18, height 5' 8.5" (1.74 m), weight 125.8 kg, SpO2 99 %. Physical Exam A&O x 3 HEENT : grossly wnl Lungs : ctab CV : RRR Abdo : soft,nt,nd Extr : no edema,nt, bilat Pelvic : deferred     Latest Ref Rng & Units 03/30/2022    9:30 AM 12/30/2021    1:26 PM 12/26/2019    4:22 PM  CBC  WBC 4.0 - 10.5 K/uL 8.5  7.6  10.3   Hemoglobin 12.0 - 15.0 g/dL 10.6  10.5  9.2   Hematocrit 36.0 - 46.0 % 35.8  37.4  33.9   Platelets 150 - 400 K/uL 411  373  439       Latest Ref Rng & Units 03/30/2022    9:30 AM 03/13/2022   10:39 AM 12/30/2021    1:26 PM  CMP  Glucose 70 - 99 mg/dL 119   93   BUN 6 - 20 mg/dL 14   9   Creatinine 0.44 - 1.00 mg/dL 0.92   0.91   Sodium 135 - 145 mmol/L 140   141   Potassium 3.5 - 5.1 mmol/L 4.6   4.4   Chloride 98 - 111 mmol/L 111   111   CO2 22 - 32 mmol/L 24   22   Calcium 8.9 - 10.3 mg/dL 8.7   9.1   Total Protein 6.0 - 8.3 g/dL  7.5    Total Bilirubin 0.2 - 1.2 mg/dL  0.5    Alkaline Phos 39 - 117 U/L  57    AST 0 - 37 U/L  17    ALT 0 - 35 U/L  10       No results found for this or any previous visit (from the past 24 hour(s)).  No results found.  Assessment/Plan: Menorrhagia to anemia - proceed to OR for planned procedure, tlh/bilat salpingectomy; she understands risk of procedure including bleeding, infection, risk of injury to bowel/bladder/nerves/blood vessels,risk of further surgery, risk of anesthesia, risk of blood clot to legs/lungs, risk of larger incision.  Consent signed. Proceed to OR. Mild anemia  H/o elevated lfts, wnl now Tobacco use - avoid for post op period  Charyl Bigger 04/02/2022, 7:10 AM

## 2022-04-02 NOTE — Op Note (Signed)
Preoperative diagnosis:  menorrhagia to anemia Postop diagnosis: Same Procedure:  total laparoscopic hysterectomy and left salpingoophorectomy Anesthesia Gen. Endotracheal Surgeon: Dr. Tiana Loft Assistant: Azucena Fallen, MD IV fluids: 1200cc LR EBL: 100cc Urine output: 350KX, clear Complications: none Pathology: Uterus with cervix, left fallopian tube Disposition: PACU, stable Findings: Normal uterus and ovaries, normal appearing left tube. Right tube previously removed. Filmy adhesions at liver surface. Normal gross appearance of bowel and omentum.    Complications of surgery including infection, bleeding, damage to internal organs and other surgery related problems including pneumonia, VTE reviewed and informed written consent was obtained. She understood and gave informed written consent.   Patient was brought to the operating room with IV running. She received vancomycin and gentamicin. The patient underwent general anesthesia without difficulty and was given dorsal lithotomy position, then prepped and draped in sterile fashion. Foley catheter was placed. Cervix was exposed with a speculum and anterior lip of the cervix was grasped with tenaculum. Uterus was sounded to 9cm. A  # 8 Rumi tip and a small Koh ring was assembled on the [employer] and entered in the uterine cavity and balloon was inflated to secure it in place. Koh ring was palpated again cervico-vaginal junction. Speculum was removed, tenaculum was left on the cervix.   Attention was focused on abdomen. 1/4% marcaine was used to infect in the umbilicus, then a 5 mm vertical incision made with scalpel. The abdomen was then entered with the optiview and entry was performed w/o diffuclty.  The opening pressure was 31mmhg and then insuflated to a pressure 24mmhg. The peritoneal cavity was evaluated. There were few filmy adhesions of the left pelvic sidewall to bowel, and also filmy adhesions noted at the liver. The patient  was then placed in trendelenburg.   Attention was then drawn to the rlq where incision was made for a 66mm trocar after injecting area with 1/4% marcaine.  The trocar was advanced under visualization and w/o difficulty.  The trocar in llq was inserted in similar fashion, though to allow a 20mm trocar..  The bowel was swept back and uterus anteverted to evaluate pelvis.  Uterus, ovaries, wnl bilat, right fallopian tube surgically removed, left wnl.  Uterus was deviated to the patient's right. The fallopian tube was removed with the harmonic scalpel and removed from the abdomen through the 63mm trocar w/o difficulty.  The left  Round ligament was transected, then the utero-ovarian ligament was transected. Anterior bladder broad ligament was opened and incised. Posterior broad ligament incised up to the uterosacral ligament and left uterine vessels skeletonized. Uterus was deviated to the left and right Round ligament transected followed by the utero-ovarian ligament. The anterior broad ligament was incised to create bladder flap, bladder was pushed away by blunt and sharp dissection with excellent hemostasis. Koh ring impression at cervicovaginal junction was seen well anteriorly. Right posterior broad ligament dissected, right Uterine vessels skeletonized. Right uterine vessels were desiccated and cut. Uterus was deviated to the right and the left uterine vessels were desiccated and cut. Vaginal occluder was inflated. Colpotomy was begun starting from midline anteriorly coming to the left and right and then circumferentially staying above the uterosacral ligaments posteriorly. Uterus and cervix were pulled out of the vaginal opening and uterus fundus was placed back in vagina to maintain pneumoperitoneum.  Vaginal cut edges were evaluated for hemostasis which was excellent. Irrigation was performed pedicles appeared dry. The endostitch with v-loc was used to close the vaginal cuff - beginning at the  right  apex and running to the left, then returning back for 2 stitches.  Excellent hemostasis was noted. The remaining marcaine was then injected at the cuff.  Irrigation was performed, all pedicles appeared to be hemostatic. The 66mm trocar was then remvoed and closed using the carter thompson device.  This was then palpated and closure confirmed. The remaining trocars were removed under visualization and the gas was expelled from the abdomen.  Skin approximated with subcuticular stitches on 4-0 Vicryl. Dermabond was applied. No vaginal bleeding noted on vaginal exam at the end. All instruments/lap/sponges counts were correct x2.  No complications. Patient tolerated procedure well and was reversed from anesthesia and brought to the PACU stable condition. Foley catheter to be removed in PACU.

## 2022-04-02 NOTE — Transfer of Care (Signed)
Immediate Anesthesia Transfer of Care Note  Patient: Evelyn Lopez  Procedure(s) Performed: TOTAL LAPAROSCOPIC HYSTERECTOMY WITH BILATERAL SALPINGECTOMY (Bilateral: Abdomen)  Patient Location: PACU  Anesthesia Type:General  Level of Consciousness: awake, alert , oriented and patient cooperative  Airway & Oxygen Therapy: Patient Spontanous Breathing and Patient connected to nasal cannula oxygen  Post-op Assessment: Report given to RN and Post -op Vital signs reviewed and stable  Post vital signs: Reviewed and stable  Last Vitals:  Vitals Value Taken Time  BP 148/107 04/02/22 1107  Temp    Pulse 78 04/02/22 1112  Resp 10 04/02/22 1112  SpO2 96 % 04/02/22 1112  Vitals shown include unvalidated device data.  Last Pain:  Vitals:   04/02/22 0617  TempSrc: Oral  PainSc: 0-No pain      Patients Stated Pain Goal: 6 (02/54/27 0623)  Complications: No notable events documented.

## 2022-04-02 NOTE — Anesthesia Procedure Notes (Signed)
Procedure Name: Intubation Date/Time: 04/02/2022 8:07 AM  Performed by: Georgeanne Nim, CRNAPre-anesthesia Checklist: Patient identified, Emergency Drugs available, Suction available, Patient being monitored and Timeout performed Patient Re-evaluated:Patient Re-evaluated prior to induction Oxygen Delivery Method: Circle system utilized Preoxygenation: Pre-oxygenation with 100% oxygen Induction Type: IV induction Ventilation: Mask ventilation without difficulty Laryngoscope Size: Mac and 4 Grade View: Grade I Tube size: 7.0 mm Number of attempts: 1 Airway Equipment and Method: Stylet Placement Confirmation: ETT inserted through vocal cords under direct vision, positive ETCO2, CO2 detector and breath sounds checked- equal and bilateral Secured at: 20 cm Tube secured with: Tape Dental Injury: Teeth and Oropharynx as per pre-operative assessment

## 2022-04-03 ENCOUNTER — Encounter (HOSPITAL_BASED_OUTPATIENT_CLINIC_OR_DEPARTMENT_OTHER): Payer: Self-pay | Admitting: Obstetrics and Gynecology

## 2022-04-03 LAB — SURGICAL PATHOLOGY

## 2022-04-03 NOTE — Progress Notes (Signed)
Patient comfortable in room; pt requesting d/c home No n/v, tol po; ambulating, passing flatus, pain controlled; voiding w/o difficulty No bloating and abd pain that she had prior to surgery - very happy and pleased with her status this far  Mother is here with patient and able to help her over next wk or so  Vs stable and wnl  A&ox3 Nml respirations Abd: soft,nt, nd; dressings x3 clear and intct LE: no edema, nt bilat  POD 1 s/p TLH/left salpingectomy Doing well, ok for d/c home; pelvic rest x6 wks, no heavy lifting; reviewed pain management; f/u in 2 wk; prescriptions sent; contin oral iron

## 2022-06-18 DIAGNOSIS — R509 Fever, unspecified: Secondary | ICD-10-CM | POA: Diagnosis not present

## 2022-06-18 DIAGNOSIS — R062 Wheezing: Secondary | ICD-10-CM | POA: Diagnosis not present

## 2022-09-30 DIAGNOSIS — R300 Dysuria: Secondary | ICD-10-CM | POA: Diagnosis not present

## 2022-12-14 ENCOUNTER — Other Ambulatory Visit: Payer: Self-pay

## 2022-12-14 ENCOUNTER — Emergency Department (HOSPITAL_BASED_OUTPATIENT_CLINIC_OR_DEPARTMENT_OTHER)
Admission: EM | Admit: 2022-12-14 | Discharge: 2022-12-14 | Disposition: A | Discharge status: Home / Self Care | Payer: Medicaid Other | Attending: Emergency Medicine | Admitting: Emergency Medicine

## 2022-12-14 ENCOUNTER — Encounter (HOSPITAL_BASED_OUTPATIENT_CLINIC_OR_DEPARTMENT_OTHER): Payer: Self-pay | Admitting: Emergency Medicine

## 2022-12-14 DIAGNOSIS — R21 Rash and other nonspecific skin eruption: Secondary | ICD-10-CM | POA: Diagnosis present

## 2022-12-14 DIAGNOSIS — L309 Dermatitis, unspecified: Secondary | ICD-10-CM

## 2022-12-14 MED ORDER — TRIAMCINOLONE ACETONIDE 0.1 % EX CREA: 1.0000 | TOPICAL_CREAM | Freq: Two times a day (BID) | CUTANEOUS | 0 refills | Status: AC

## 2022-12-14 NOTE — ED Provider Notes (Signed)
[facility] Provider Note   CSN: [id number] Arrival date & time: 12/14/22  5784     History  Chief Complaint  Patient presents with   Leg Swelling    Evelyn Lopez is a 42 y.o. female.  HPI Patient reports rash to both lower legs.  More concentrated to the front of the legs.  This started several weeks ago.  Patient was seen at urgent care and given a course of doxycycline and instructed to stop wearing her compression hose.  She reports temporarily the symptoms improved but now they seem to flare again.  She reports she is got uncomfortable itching feeling and painful sensation in both lower legs and tingling sensation in her feet.  No chest pain, no shortness of breath, no fevers or chills.  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  She reports she does sweat a lot.  Initially she was wearing compression hose but since developing this rash has stopped.  She reports she does wear a Jegging or a looser fitting stretchy pants.  She also works with some general cleaner in a bucket that may splash her lower legs.    Home Medications Prior to Admission medications   Medication Sig Start Date End Date Taking? Authorizing Provider  triamcinolone cream (KENALOG) 0.1 % Apply 1 Application topically 2 (two) times daily. Rash on lower legs 12/14/22  Yes Rosaire Cueto, Lebron Conners, MD  ALPRAZolam Prudy Feeler) 1 MG tablet Take 1 mg by mouth as needed. 4 times daily as needed.    [provider]  benztropine (COGENTIN) 1 MG tablet Take 1 mg by mouth at bedtime.    [provider]  busPIRone (BUSPAR) 10 MG tablet Take 10 mg by mouth 2 (two) times daily. Morning & afternoon 02/14/19   [provider]  hydrOXYzine (VISTARIL) 50 MG capsule Take 50 mg by mouth 2 (two) times daily. In the afternoon & at bedtime. 02/20/19   [provider]  ibuprofen (ADVIL) 800 MG tablet Take 1 tablet (800 mg total) by mouth every 8 (eight) hours as needed for  mild pain. 04/02/22   Vick Frees, MD  norethindrone (AYGESTIN) 5 MG tablet Take 5 mg by mouth in the morning, at noon, and at bedtime. Tapers dose while bleeding    [provider]  omeprazole (PRILOSEC) 20 MG capsule Take 20 mg by mouth daily.    [provider]  oxyCODONE (OXY IR/ROXICODONE) 5 MG immediate release tablet Take 1 tablet (5 mg total) by mouth every 4 (four) hours as needed for moderate pain. 04/02/22   Vick Frees, MD  pramipexole (MIRAPEX) 1.5 MG tablet Take 1.5 mg by mouth at bedtime.    [provider]      Allergies    Covid-19 (mrna bivalent) vaccine (pfizer) [covid-19 (mrna) vaccine] and Rocephin [ceftriaxone sodium in dextrose]    Review of Systems   Review of Systems  Physical Exam Updated Vital Signs BP (!) 142/104 (BP Location: Left Arm)   Pulse 85   Temp 97.8 F (36.6 C) (Oral)   Resp 20   Ht 5\' 9"  (1.753 m)   Wt 113.4 kg   LMP  (LMP Unknown) Comment: Pt has been bleeding on and off since 08/21/21. DOS PREG TEST NEGATIVE.  SpO2 100%   BMI 36.92 kg/m  Physical Exam Constitutional:      Comments: Alert nontoxic clinically well in appearance.  HENT:     Mouth/Throat:     Pharynx:  Oropharynx is clear.  Eyes:     Extraocular Movements: Extraocular movements intact.  Cardiovascular:     Rate and Rhythm: Normal rate and regular rhythm.  Pulmonary:     Effort: Pulmonary effort is normal.     Breath sounds: Normal breath sounds.  Musculoskeletal:        General: Normal range of motion.     Comments: Patient does not have objective swelling of the lower legs.  There is no pitting present.  Feet do not have any edema.  There is a erythematous patchy rash concentrated to the tibial surface of the lower legs.  It has a fairly well-demarcated lower edge and to demarcated edges to the medial aspect of the rash.  See attached images.  Feet are warm and dry with brisk cap refill.  2+ dorsalis pedis pulses.     ED Results /  Procedures / Treatments   Labs (all labs ordered are listed, but only abnormal results are displayed) Labs Reviewed - No data to display  EKG None  Radiology No results found.  Procedures Procedures    Medications Ordered in ED Medications - No data to display  ED Course/ Medical Decision Making/ A&P                             Medical Decision Making  Patient's rash seems to correspond to delineated markings suggestive of dermatitis from compressive support hose.  There are 2 corresponding spared streaks in the medial surface of each leg.  This seems less likely to be venous stasis dermatitis as the patient does not have any pitting edema.  She started noting some improvement after discontinuing her compression hose and taking doxycycline.  We discussed garments at work and wearing a very loose fitting cotton that will not hold moisture or rub her skin.  At this time plan will be to try a course of triamcinolone and changing her work close.  Discussed using Benadryl this weekend to help with any itching and extra strength Tylenol if needed for pain.  Patient is advised to get a close follow-up with her PCP to see if this is alleviating symptoms.  If not possible need for referral to dermatology.        Final Clinical Impression(s) / ED Diagnoses Final diagnoses:  Dermatitis    Rx / DC Orders ED Discharge Orders          Ordered    triamcinolone cream (KENALOG) 0.1 %  2 times daily        12/14/22 1610              Arby Barrette, MD 12/14/22 [phone]

## 2022-12-14 NOTE — Discharge Instructions (Addendum)
1.  Apply the steroid cream to your lower legs twice a day.  Try to elevate your legs as much as possible.  You may take 1-2 Benadryl tablets every 6 hours as needed for itching and swelling.  Take extra strength Tylenol every 6 hours for pain and discomfort.  Change your work clothes to a loose fitting, thick cotton work pants that cover the lower leg over your shoe.  At this time, do not wear any tight or constrictive materials against your skin. 2.  Schedule a follow-up appoint with your doctor next week to see how you responded to this treatment.  If your symptoms are persisting or worsening, you may need to be seen by a dermatologist.

## 2022-12-14 NOTE — ED Triage Notes (Signed)
Ankle pain, swelling and rash for 2 weeks.  Pt seen at UC 2 weeks ago and was treated with doxycyline.  Pt states her symptoms improved but returned about 3-4 days ago.

## 2023-08-25 ENCOUNTER — Other Ambulatory Visit: Payer: Self-pay | Admitting: Podiatry

## 2023-08-25 DIAGNOSIS — M76821 Posterior tibial tendinitis, right leg: Secondary | ICD-10-CM

## 2023-09-10 ENCOUNTER — Other Ambulatory Visit
